# Patient Record
Sex: Female | Born: 1935
Health system: Southern US, Community
[De-identification: ages and names within clinical notes are randomized; demographics above are authoritative.]

## PROBLEM LIST (undated history)

## (undated) DIAGNOSIS — E039 Hypothyroidism, unspecified: Secondary | ICD-10-CM

## (undated) DIAGNOSIS — H3530 Unspecified macular degeneration: Secondary | ICD-10-CM

## (undated) DIAGNOSIS — I10 Essential (primary) hypertension: Secondary | ICD-10-CM

## (undated) DIAGNOSIS — M109 Gout, unspecified: Secondary | ICD-10-CM

## (undated) DIAGNOSIS — I480 Paroxysmal atrial fibrillation: Secondary | ICD-10-CM

## (undated) DIAGNOSIS — I4430 Unspecified atrioventricular block: Secondary | ICD-10-CM

## (undated) DIAGNOSIS — I5023 Acute on chronic systolic (congestive) heart failure: Secondary | ICD-10-CM

## (undated) DIAGNOSIS — R51 Headache: Secondary | ICD-10-CM

## (undated) DIAGNOSIS — R519 Headache, unspecified: Secondary | ICD-10-CM

## (undated) DIAGNOSIS — M1990 Unspecified osteoarthritis, unspecified site: Secondary | ICD-10-CM

## (undated) DIAGNOSIS — I350 Nonrheumatic aortic (valve) stenosis: Secondary | ICD-10-CM

## (undated) HISTORY — PX: DILATATION & CURETTAGE/HYSTEROSCOPY WITH MYOSURE: SHX6511

## (undated) HISTORY — DX: Nonrheumatic aortic (valve) stenosis: I35.0

## (undated) HISTORY — DX: Gout, unspecified: M10.9

## (undated) HISTORY — DX: Paroxysmal atrial fibrillation: I48.0

## (undated) HISTORY — PX: OTHER SURGICAL HISTORY: SHX169

## (undated) HISTORY — DX: Headache, unspecified: R51.9

## (undated) HISTORY — DX: Unspecified osteoarthritis, unspecified site: M19.90

## (undated) HISTORY — DX: Hypothyroidism, unspecified: E03.9

## (undated) HISTORY — DX: Essential (primary) hypertension: I10

## (undated) HISTORY — PX: TONSILLECTOMY: SUR1361

## (undated) HISTORY — DX: Headache: R51

---

## 1998-12-20 ENCOUNTER — Other Ambulatory Visit: Admission: RE | Admit: 1998-12-20 | Discharge: 1998-12-20 | Payer: Self-pay | Admitting: Obstetrics and Gynecology

## 2004-07-04 ENCOUNTER — Ambulatory Visit: Payer: Self-pay | Admitting: Internal Medicine

## 2004-11-07 ENCOUNTER — Ambulatory Visit: Payer: Self-pay | Admitting: Internal Medicine

## 2005-03-01 ENCOUNTER — Ambulatory Visit: Payer: Self-pay | Admitting: Internal Medicine

## 2005-04-12 ENCOUNTER — Ambulatory Visit: Payer: Self-pay | Admitting: Internal Medicine

## 2005-06-05 ENCOUNTER — Ambulatory Visit: Payer: Self-pay | Admitting: Internal Medicine

## 2005-12-04 ENCOUNTER — Ambulatory Visit: Payer: Self-pay | Admitting: Internal Medicine

## 2006-04-16 ENCOUNTER — Ambulatory Visit: Payer: Self-pay | Admitting: Internal Medicine

## 2006-06-02 ENCOUNTER — Ambulatory Visit: Payer: Self-pay | Admitting: Internal Medicine

## 2006-06-02 LAB — CONVERTED CEMR LAB
BUN: 30 mg/dL — ABNORMAL HIGH (ref 6–23)
CO2: 31 meq/L (ref 19–32)
Calcium: 9.6 mg/dL (ref 8.4–10.5)
Chloride: 105 meq/L (ref 96–112)
Creatinine, Ser: 1.2 mg/dL (ref 0.4–1.2)
GFR calc non Af Amer: 45 mL/min
Glomerular Filtration Rate, Af Am: 55 mL/min/{1.73_m2}
Glucose, Bld: 91 mg/dL (ref 70–99)
Potassium: 4.1 meq/L (ref 3.5–5.1)
Sodium: 141 meq/L (ref 135–145)
TSH: 1.57 microintl units/mL (ref 0.35–5.50)

## 2006-09-11 ENCOUNTER — Ambulatory Visit: Payer: Self-pay | Admitting: Family Medicine

## 2006-11-03 ENCOUNTER — Ambulatory Visit: Payer: Self-pay | Admitting: Internal Medicine

## 2006-11-25 ENCOUNTER — Ambulatory Visit: Payer: Self-pay | Admitting: Internal Medicine

## 2006-12-02 ENCOUNTER — Ambulatory Visit: Payer: Self-pay | Admitting: Internal Medicine

## 2006-12-02 LAB — CONVERTED CEMR LAB
ALT: 22 units/L (ref 0–40)
AST: 33 units/L (ref 0–37)
Albumin: 4 g/dL (ref 3.5–5.2)
Alkaline Phosphatase: 70 units/L (ref 39–117)
BUN: 28 mg/dL — ABNORMAL HIGH (ref 6–23)
Basophils Absolute: 0 10*3/uL (ref 0.0–0.1)
Basophils Relative: 0.2 % (ref 0.0–1.0)
Bilirubin, Direct: 0.1 mg/dL (ref 0.0–0.3)
CO2: 30 meq/L (ref 19–32)
Calcium: 9.4 mg/dL (ref 8.4–10.5)
Chloride: 104 meq/L (ref 96–112)
Creatinine, Ser: 1.2 mg/dL (ref 0.4–1.2)
Eosinophils Absolute: 0.2 10*3/uL (ref 0.0–0.6)
Eosinophils Relative: 2.8 % (ref 0.0–5.0)
GFR calc Af Amer: 55 mL/min
GFR calc non Af Amer: 45 mL/min
Glucose, Bld: 96 mg/dL (ref 70–99)
HCT: 39.1 % (ref 36.0–46.0)
Hemoglobin: 13.4 g/dL (ref 12.0–15.0)
Lymphocytes Relative: 28.8 % (ref 12.0–46.0)
MCHC: 34.2 g/dL (ref 30.0–36.0)
MCV: 84.3 fL (ref 78.0–100.0)
Monocytes Absolute: 0.5 10*3/uL (ref 0.2–0.7)
Monocytes Relative: 9.4 % (ref 3.0–11.0)
Neutro Abs: 3.4 10*3/uL (ref 1.4–7.7)
Neutrophils Relative %: 58.8 % (ref 43.0–77.0)
Platelets: 203 10*3/uL (ref 150–400)
Potassium: 4 meq/L (ref 3.5–5.1)
RBC: 4.64 M/uL (ref 3.87–5.11)
RDW: 13.4 % (ref 11.5–14.6)
Sodium: 141 meq/L (ref 135–145)
TSH: 5.17 microintl units/mL (ref 0.35–5.50)
Total Bilirubin: 0.9 mg/dL (ref 0.3–1.2)
Total Protein: 7.8 g/dL (ref 6.0–8.3)
WBC: 5.7 10*3/uL (ref 4.5–10.5)

## 2006-12-04 ENCOUNTER — Ambulatory Visit: Payer: Self-pay | Admitting: Internal Medicine

## 2007-03-19 DIAGNOSIS — I4510 Unspecified right bundle-branch block: Secondary | ICD-10-CM | POA: Insufficient documentation

## 2007-03-19 DIAGNOSIS — E039 Hypothyroidism, unspecified: Secondary | ICD-10-CM | POA: Insufficient documentation

## 2007-03-19 DIAGNOSIS — I10 Essential (primary) hypertension: Secondary | ICD-10-CM

## 2007-03-19 DIAGNOSIS — M810 Age-related osteoporosis without current pathological fracture: Secondary | ICD-10-CM | POA: Insufficient documentation

## 2007-05-25 ENCOUNTER — Ambulatory Visit: Payer: Self-pay | Admitting: Internal Medicine

## 2007-05-26 LAB — CONVERTED CEMR LAB
BUN: 29 mg/dL — ABNORMAL HIGH (ref 6–23)
CO2: 29 meq/L (ref 19–32)
Calcium: 9.5 mg/dL (ref 8.4–10.5)
Chloride: 105 meq/L (ref 96–112)
Creatinine, Ser: 1.3 mg/dL — ABNORMAL HIGH (ref 0.4–1.2)
GFR calc Af Amer: 50 mL/min
GFR calc non Af Amer: 41 mL/min
Glucose, Bld: 79 mg/dL (ref 70–99)
Potassium: 4 meq/L (ref 3.5–5.1)
Sodium: 140 meq/L (ref 135–145)
TSH: 4.88 microintl units/mL (ref 0.35–5.50)

## 2007-06-26 ENCOUNTER — Ambulatory Visit: Payer: Self-pay | Admitting: Internal Medicine

## 2007-06-26 DIAGNOSIS — L570 Actinic keratosis: Secondary | ICD-10-CM

## 2007-07-03 ENCOUNTER — Ambulatory Visit: Payer: Self-pay | Admitting: Internal Medicine

## 2007-07-16 ENCOUNTER — Telehealth: Payer: Self-pay | Admitting: Internal Medicine

## 2007-09-08 ENCOUNTER — Ambulatory Visit: Payer: Self-pay | Admitting: Internal Medicine

## 2007-09-14 ENCOUNTER — Ambulatory Visit: Payer: Self-pay | Admitting: Internal Medicine

## 2007-09-17 ENCOUNTER — Ambulatory Visit: Payer: Self-pay | Admitting: Internal Medicine

## 2007-09-23 ENCOUNTER — Ambulatory Visit: Payer: Self-pay | Admitting: Internal Medicine

## 2007-11-23 ENCOUNTER — Ambulatory Visit: Payer: Self-pay | Admitting: Internal Medicine

## 2007-11-24 LAB — CONVERTED CEMR LAB
ALT: 28 units/L (ref 0–35)
Alkaline Phosphatase: 47 units/L (ref 39–117)
Basophils Absolute: 0 10*3/uL (ref 0.0–0.1)
Bilirubin, Direct: 0.1 mg/dL (ref 0.0–0.3)
CO2: 31 meq/L (ref 19–32)
Calcium: 9.8 mg/dL (ref 8.4–10.5)
GFR calc Af Amer: 50 mL/min
Glucose, Bld: 87 mg/dL (ref 70–99)
Lymphocytes Relative: 39.1 % (ref 12.0–46.0)
MCHC: 33.7 g/dL (ref 30.0–36.0)
Monocytes Relative: 8 % (ref 3.0–12.0)
Neutro Abs: 2.4 10*3/uL (ref 1.4–7.7)
Platelets: 189 10*3/uL (ref 150–400)
Potassium: 3.6 meq/L (ref 3.5–5.1)
RDW: 13.2 % (ref 11.5–14.6)
Sodium: 143 meq/L (ref 135–145)
Total Bilirubin: 0.8 mg/dL (ref 0.3–1.2)
Total Protein: 7.3 g/dL (ref 6.0–8.3)

## 2007-11-26 ENCOUNTER — Emergency Department (HOSPITAL_COMMUNITY): Admission: EM | Admit: 2007-11-26 | Discharge: 2007-11-26 | Payer: Self-pay | Admitting: Emergency Medicine

## 2007-12-07 ENCOUNTER — Ambulatory Visit: Payer: Self-pay | Admitting: Internal Medicine

## 2008-01-08 ENCOUNTER — Ambulatory Visit: Payer: Self-pay | Admitting: Internal Medicine

## 2008-01-14 ENCOUNTER — Ambulatory Visit: Payer: Self-pay | Admitting: Internal Medicine

## 2008-01-15 ENCOUNTER — Ambulatory Visit: Payer: Self-pay | Admitting: Internal Medicine

## 2008-02-19 ENCOUNTER — Encounter: Payer: Self-pay | Admitting: Internal Medicine

## 2008-03-30 ENCOUNTER — Ambulatory Visit: Payer: Self-pay | Admitting: Internal Medicine

## 2008-04-06 ENCOUNTER — Telehealth: Payer: Self-pay | Admitting: Internal Medicine

## 2008-04-13 ENCOUNTER — Ambulatory Visit: Payer: Self-pay | Admitting: Internal Medicine

## 2008-04-27 ENCOUNTER — Ambulatory Visit: Payer: Self-pay | Admitting: Internal Medicine

## 2008-04-27 LAB — CONVERTED CEMR LAB

## 2008-04-29 LAB — CONVERTED CEMR LAB
BUN: 27 mg/dL — ABNORMAL HIGH (ref 6–23)
CO2: 28 meq/L (ref 19–32)
Eosinophils Relative: 2.7 % (ref 0.0–5.0)
Glucose, Bld: 95 mg/dL (ref 70–99)
HCT: 40 % (ref 36.0–46.0)
Hemoglobin: 13.7 g/dL (ref 12.0–15.0)
Lymphocytes Relative: 34.8 % (ref 12.0–46.0)
Monocytes Absolute: 0.5 10*3/uL (ref 0.1–1.0)
Monocytes Relative: 8.9 % (ref 3.0–12.0)
Neutro Abs: 3 10*3/uL (ref 1.4–7.7)
Platelets: 213 10*3/uL (ref 150–400)
Potassium: 4.4 meq/L (ref 3.5–5.1)
RDW: 13.8 % (ref 11.5–14.6)
Sodium: 144 meq/L (ref 135–145)
WBC: 5.5 10*3/uL (ref 4.5–10.5)

## 2008-05-13 ENCOUNTER — Ambulatory Visit: Payer: Self-pay | Admitting: Internal Medicine

## 2008-05-26 ENCOUNTER — Ambulatory Visit: Payer: Self-pay | Admitting: Internal Medicine

## 2008-05-27 ENCOUNTER — Telehealth: Payer: Self-pay | Admitting: Internal Medicine

## 2008-05-28 ENCOUNTER — Telehealth (INDEPENDENT_AMBULATORY_CARE_PROVIDER_SITE_OTHER): Payer: Self-pay | Admitting: *Deleted

## 2008-05-30 ENCOUNTER — Ambulatory Visit: Payer: Self-pay | Admitting: Internal Medicine

## 2008-06-07 ENCOUNTER — Ambulatory Visit: Payer: Self-pay | Admitting: Internal Medicine

## 2008-06-28 ENCOUNTER — Ambulatory Visit: Payer: Self-pay | Admitting: Internal Medicine

## 2008-06-28 DIAGNOSIS — M109 Gout, unspecified: Secondary | ICD-10-CM

## 2008-06-28 LAB — CONVERTED CEMR LAB: Vit D, 1,25-Dihydroxy: 52 (ref 30–89)

## 2008-06-29 LAB — CONVERTED CEMR LAB
CO2: 27 meq/L (ref 19–32)
Chloride: 106 meq/L (ref 96–112)
Glucose, Bld: 102 mg/dL — ABNORMAL HIGH (ref 70–99)
Sodium: 142 meq/L (ref 135–145)
TSH: 0.02 microintl units/mL — ABNORMAL LOW (ref 0.35–5.50)
Uric Acid, Serum: 9.9 mg/dL — ABNORMAL HIGH (ref 2.4–7.0)

## 2008-07-08 ENCOUNTER — Ambulatory Visit: Payer: Self-pay | Admitting: Internal Medicine

## 2008-07-15 ENCOUNTER — Ambulatory Visit: Payer: Self-pay | Admitting: Internal Medicine

## 2008-07-22 ENCOUNTER — Ambulatory Visit: Payer: Self-pay | Admitting: Internal Medicine

## 2008-08-12 ENCOUNTER — Ambulatory Visit: Payer: Self-pay | Admitting: Internal Medicine

## 2008-09-07 ENCOUNTER — Ambulatory Visit: Payer: Self-pay | Admitting: Internal Medicine

## 2008-10-24 ENCOUNTER — Ambulatory Visit: Payer: Self-pay | Admitting: Internal Medicine

## 2008-12-12 ENCOUNTER — Ambulatory Visit: Payer: Self-pay | Admitting: Internal Medicine

## 2008-12-15 LAB — CONVERTED CEMR LAB
Calcium: 9.9 mg/dL (ref 8.4–10.5)
GFR calc non Af Amer: 44.98 mL/min (ref >60)
Potassium: 4.7 meq/L (ref 3.5–5.1)
Sodium: 141 meq/L (ref 135–145)
TSH: 0.02 microintl units/mL — ABNORMAL LOW (ref 0.35–5.50)

## 2009-02-02 ENCOUNTER — Ambulatory Visit: Payer: Self-pay | Admitting: Internal Medicine

## 2011-07-16 DIAGNOSIS — H3530 Unspecified macular degeneration: Secondary | ICD-10-CM | POA: Insufficient documentation

## 2014-09-12 ENCOUNTER — Encounter: Payer: Self-pay | Admitting: Internal Medicine

## 2014-09-12 ENCOUNTER — Encounter: Payer: Self-pay | Admitting: *Deleted

## 2014-09-12 ENCOUNTER — Ambulatory Visit (INDEPENDENT_AMBULATORY_CARE_PROVIDER_SITE_OTHER): Payer: Medicare Other | Admitting: Internal Medicine

## 2014-09-12 VITALS — BP 172/40 | HR 39 | Ht 63.0 in | Wt 158.6 lb

## 2014-09-12 DIAGNOSIS — R011 Cardiac murmur, unspecified: Secondary | ICD-10-CM

## 2014-09-12 DIAGNOSIS — R001 Bradycardia, unspecified: Secondary | ICD-10-CM

## 2014-09-12 NOTE — Progress Notes (Signed)
ELECTROPHYSIOLOGY CONSULT NOTE  Patient ID: Kara Cruz, MRN: [id number], DOB/AGE: 79/04/1918 79 y.o. Admit date: (Not on file) Date of Consult: 09/12/2014  Primary Physician: Garlan FillersPATERSON,DANIEL G, MD Primary Cardiologist: new  Chief Complaint: slow heart rate   HPI Kara Codernne S Brinkman is a 81 y.o. female  Seen as an add-on today at the request of Dr. Eloise HarmanPaterson because she had identified a major change in her heart rate during her daily vital sign assessment.  Her heart rate typically runs in the 70 range. She woke up yesterday morning and it was 35. The remaining 35 today. She went to see her PCP.  She's noticed a modest change in her exercise tolerance. She is however, better today than she was yesterday.  She denies chest pain shortness of breath lightheadedness or peripheral edema.  She has however noted over recent weeks and months increasing problems with exercise tolerance.  She has no history of right bundle branch block according to problem list. She also has a history of long-standing of a heart murmur.     Past Medical History  Diagnosis Date  . HTN (hypertension)   . Hypothyroidism   . Gout   . Dizziness     transient  . Osteoarthritis   . Frequent headaches       Surgical History:  Past Surgical History  Procedure Laterality Date  . Tonsillectomy      1928  . Dilatation & curettage/hysteroscopy with myosure    . Eye injections    . Euflexa into knees       Home Meds: Prior to Admission medications   Medication Sig Start Date End Date Taking? Authorizing Provider  allopurinol (ZYLOPRIM) 100 MG tablet Take 100 mg by mouth daily.   Yes Historical Provider, MD  Calcium Carbonate-Vitamin D (CALTRATE 600+D PO) Take 1 tablet by mouth 2 (two) times daily.   Yes Historical Provider, MD  cholecalciferol (VITAMIN D) 1000 UNITS tablet Take 1,000 Units by mouth daily.   Yes Historical Provider, MD  levothyroxine (SYNTHROID, LEVOTHROID) 100 MCG tablet Take 100 mcg by  mouth daily before breakfast.   Yes Historical Provider, MD  losartan-hydrochlorothiazide (HYZAAR) 100-12.5 MG per tablet Take 1 tablet by mouth daily.   Yes Historical Provider, MD  Lutein 20 MG TABS Take 1 tablet by mouth daily.   Yes Historical Provider, MD  meclizine (ANTIVERT) 25 MG tablet Take 25 mg by mouth 3 (three) times daily as needed for dizziness.   Yes Historical Provider, MD  Multiple Vitamin (MULTIVITAMIN) capsule Take 1 capsule by mouth daily.   Yes Historical Provider, MD  Multiple Vitamins-Minerals (ICAPS) CAPS Take 2 capsules by mouth daily.   Yes Historical Provider, MD  Potassium Bicarbonate 99 MG CAPS Take 1 capsule by mouth daily.   Yes Historical Provider, MD  VITAMIN E PO Take 1 capsule by mouth daily.   Yes Historical Provider, MD      Allergies:  Allergies  Allergen Reactions  . Codeine Sulfate     REACTION: "out of body experience"--"floats"  . Diphenhydramine Hcl     REACTION: headache, vertigo  . Doxycycline Hyclate     REACTION: nausea, vomiting  . Prednisone     REACTION: SOB, difficulty swallowing    History   Social History  . Marital Status: Widowed    Spouse Name: N/A    Number of Children: N/A  . Years of Education: N/A   Occupational History  . Not on file.   Social History  Main Topics  . Smoking status: Former Games developer  . Smokeless tobacco: Not on file  . Alcohol Use: No  . Drug Use: No  . Sexual Activity: Not on file   Other Topics Concern  . Not on file   Social History Narrative     Family History  Problem Relation Age of Onset  . Hypertension Mother   . Arthritis Mother   . Stroke Mother   . Heart attack Father   . Stroke Brother   . Prostate cancer Brother   . Heart disease Brother   . Kidney failure Brother   . Heart disease Brother   . Arthritis Brother   . Edema Brother      ROS:  Please see the history of present illness.     All other systems reviewed and negative.    Physical Exam: She had a positive  preprocedure lab Blood pressure 172/40, pulse 39, height  (1.6 m), weight 158 lb 9.6 oz (71.94 kg). General: Well developed, well nourished female in no acute distress. Head: Normocephalic, atraumatic, sclera non-icteric, no xanthomas, nares are without discharge. EENT: normal Lymph Nodes:  none Back: without scoliosis/kyphosis  no CVA tendersness Neck: Negative for carotid bruits. JVD not elevated  Carotids delayed Lungs: Clear bilaterally to auscultation without wheezes, rales, or rhonchi. Breathing is unlabored. Heart: slow wRRR with S1 S2. 3/6 systolic murmur , rubs, or gallops appreciated. Abdomen: Soft, non-tender, non-distended with normoactive bowel sounds. No hepatomegaly. No rebound/guarding. No obvious abdominal masses. Msk:  Strength and tone appear normal for age. Extremities: No clubbing or cyanosis. No edema.  Distal pedal pulses are 2+ and equal bilaterally. Skin: Warm and Dry Neuro: Alert and oriented X 3. CN III-XII intact Grossly normal sensory and motor function . Psych:  Responds to questions appropriately with a normal affect.      Labs: Cardiac Enzymes No results for input(s): CKTOTAL, CKMB, TROPONINI in the last 72 hours. CBC Lab Results  Component Value Date   WBC 5.5 04/27/2008   HGB 13.7 04/27/2008   HCT 40.0 04/27/2008   MCV 84.6 04/27/2008   PLT 213 04/27/2008   PROTIME: No results for input(s): LABPROT, INR in the last 72 hours. Chemistry No results for input(s): NA, K, CL, CO2, BUN, CREATININE, CALCIUM, PROT, BILITOT, ALKPHOS, ALT, AST, GLUCOSE in the last 168 hours.  Invalid input(s): LABALBU Lipids No results found for: CHOL, HDL, LDLCALC, TRIG BNP No results found for: PROBNP Miscellaneous No results found for: DDIMER  Radiology/Studies:  No results found.  EKG: NSR @ 78 2:1 AVblock  RBBB  LAD     Assessment and Plan:   Heart Block  2:1   RBBB LAD  Hypertension   Outflow murmur consistent with aortic stenosis  She  has new onset high-grade heart block in the context of bifascicular block. There is no reversible cause. We will check her thyroid blood (this is going to be an issue. We have discussed the potential role of pacing including the potential risks of the procedure. She says that "I am 89". She is not sure that she would like to proceed with pacing.  We will obtain an echo to look to see if she has aortic stenosis   I have told her that there is no expected benefit for functional status with pacing as she was presumably in sinus rhythm 48 hours ago based on her heart rate numbers.\ If echo confirmed significant aortic stenosis, she may be a candidate for TAVI  Her blood pressure treatment options will change following pacing.   Sherryl Manges

## 2014-09-12 NOTE — Patient Instructions (Signed)
Your physician recommends that you continue on your current medications as directed. Please refer to the Current Medication list given to you today.  Your physician has requested that you have an echocardiogram. Echocardiography is a painless test that uses sound waves to create images of your heart. It provides your doctor with information about the size and shape of your heart and how well your heart's chambers and valves are working. This procedure takes approximately one hour. There are no restrictions for this procedure.  We will call you tomorrow to discuss next step.

## 2014-09-13 ENCOUNTER — Encounter (HOSPITAL_COMMUNITY): Payer: Self-pay | Admitting: Pharmacy Technician

## 2014-09-13 ENCOUNTER — Telehealth: Payer: Self-pay | Admitting: *Deleted

## 2014-09-13 ENCOUNTER — Encounter: Payer: Self-pay | Admitting: *Deleted

## 2014-09-13 ENCOUNTER — Other Ambulatory Visit: Payer: Self-pay | Admitting: Internal Medicine

## 2014-09-13 ENCOUNTER — Ambulatory Visit (HOSPITAL_BASED_OUTPATIENT_CLINIC_OR_DEPARTMENT_OTHER): Payer: Medicare Other | Admitting: Radiology

## 2014-09-13 DIAGNOSIS — I442 Atrioventricular block, complete: Secondary | ICD-10-CM

## 2014-09-13 DIAGNOSIS — R011 Cardiac murmur, unspecified: Secondary | ICD-10-CM | POA: Insufficient documentation

## 2014-09-13 DIAGNOSIS — R001 Bradycardia, unspecified: Secondary | ICD-10-CM

## 2014-09-13 DIAGNOSIS — Z87891 Personal history of nicotine dependence: Secondary | ICD-10-CM | POA: Insufficient documentation

## 2014-09-13 DIAGNOSIS — I10 Essential (primary) hypertension: Secondary | ICD-10-CM

## 2014-09-13 NOTE — Telephone Encounter (Signed)
Spoke with patient's dtr, per her ok.   Patient would like to proceed with PPM implant. Reviewed PPM instructions. NPO after MN, may take morning medications with small amount of water, wash chest/neck tonight and in morning, overnight stay. Wound check arranged for 2/22. Patient and her dtr verbalized understanding and agreeable to plan.

## 2014-09-13 NOTE — Progress Notes (Signed)
Echocardiogram performed.  

## 2014-09-14 ENCOUNTER — Inpatient Hospital Stay (HOSPITAL_COMMUNITY)
Admission: RE | Admit: 2014-09-14 | Discharge: 2014-09-16 | DRG: 310 | Disposition: A | Discharge status: Home / Self Care | Payer: Medicare Other | Source: Ambulatory Visit | Attending: Internal Medicine | Admitting: Internal Medicine

## 2014-09-14 ENCOUNTER — Encounter (HOSPITAL_COMMUNITY): Payer: Self-pay | Admitting: *Deleted

## 2014-09-14 ENCOUNTER — Encounter (HOSPITAL_COMMUNITY)
Admission: RE | Disposition: A | Discharge status: Home / Self Care | Payer: Self-pay | Source: Ambulatory Visit | Attending: Internal Medicine

## 2014-09-14 DIAGNOSIS — I442 Atrioventricular block, complete: Secondary | ICD-10-CM

## 2014-09-14 DIAGNOSIS — E039 Hypothyroidism, unspecified: Secondary | ICD-10-CM | POA: Diagnosis present

## 2014-09-14 DIAGNOSIS — M109 Gout, unspecified: Secondary | ICD-10-CM | POA: Diagnosis present

## 2014-09-14 DIAGNOSIS — I350 Nonrheumatic aortic (valve) stenosis: Secondary | ICD-10-CM

## 2014-09-14 DIAGNOSIS — Z950 Presence of cardiac pacemaker: Secondary | ICD-10-CM

## 2014-09-14 DIAGNOSIS — Z8249 Family history of ischemic heart disease and other diseases of the circulatory system: Secondary | ICD-10-CM

## 2014-09-14 DIAGNOSIS — I272 Other secondary pulmonary hypertension: Secondary | ICD-10-CM | POA: Diagnosis present

## 2014-09-14 DIAGNOSIS — I10 Essential (primary) hypertension: Secondary | ICD-10-CM | POA: Diagnosis present

## 2014-09-14 DIAGNOSIS — Z87891 Personal history of nicotine dependence: Secondary | ICD-10-CM

## 2014-09-14 DIAGNOSIS — R011 Cardiac murmur, unspecified: Secondary | ICD-10-CM | POA: Diagnosis present

## 2014-09-14 DIAGNOSIS — R001 Bradycardia, unspecified: Secondary | ICD-10-CM | POA: Diagnosis present

## 2014-09-14 DIAGNOSIS — I4510 Unspecified right bundle-branch block: Secondary | ICD-10-CM | POA: Diagnosis present

## 2014-09-14 HISTORY — PX: PERMANENT PACEMAKER INSERTION: SHX5480

## 2014-09-14 HISTORY — DX: Unspecified atrioventricular block: I44.30

## 2014-09-14 LAB — SURGICAL PCR SCREEN
MRSA, PCR: NEGATIVE
STAPHYLOCOCCUS AUREUS: NEGATIVE

## 2014-09-14 SURGERY — PERMANENT PACEMAKER INSERTION
Anesthesia: LOCAL

## 2014-09-14 MED ORDER — CEFAZOLIN SODIUM 1-5 GM-% IV SOLN
1.0000 g | Freq: Four times a day (QID) | INTRAVENOUS | Status: AC
  Administered 2014-09-14 – 2014-09-15 (×3): 1 g via INTRAVENOUS
  Filled 2014-09-14 (×4): qty 50

## 2014-09-14 MED ORDER — VITAMIN D3 25 MCG (1000 UNIT) PO TABS
1000.0000 [IU] | ORAL_TABLET | Freq: Every day | ORAL | Status: DC
  Administered 2014-09-15 – 2014-09-16 (×2): 1000 [IU] via ORAL
  Filled 2014-09-14 (×2): qty 1

## 2014-09-14 MED ORDER — ONDANSETRON HCL 4 MG/2ML IJ SOLN: 4.0000 mg | Freq: Four times a day (QID) | INTRAMUSCULAR | Status: DC | PRN

## 2014-09-14 MED ORDER — HEPARIN (PORCINE) IN NACL 2-0.9 UNIT/ML-% IJ SOLN
INTRAMUSCULAR | Status: AC
  Filled 2014-09-14: qty 500

## 2014-09-14 MED ORDER — LABETALOL HCL 100 MG PO TABS
100.0000 mg | ORAL_TABLET | Freq: Two times a day (BID) | ORAL | Status: DC
  Administered 2014-09-14 – 2014-09-16 (×4): 100 mg via ORAL
  Filled 2014-09-14 (×6): qty 1

## 2014-09-14 MED ORDER — FENTANYL CITRATE 0.05 MG/ML IJ SOLN
INTRAMUSCULAR | Status: AC
  Filled 2014-09-14: qty 2

## 2014-09-14 MED ORDER — SODIUM CHLORIDE 0.9 % IV SOLN
INTRAVENOUS | Status: AC
  Administered 2014-09-14: 18:00:00 via INTRAVENOUS

## 2014-09-14 MED ORDER — LOSARTAN POTASSIUM 50 MG PO TABS
100.0000 mg | ORAL_TABLET | Freq: Every day | ORAL | Status: DC
  Administered 2014-09-15 – 2014-09-16 (×2): 100 mg via ORAL
  Filled 2014-09-14 (×2): qty 2

## 2014-09-14 MED ORDER — SODIUM CHLORIDE 0.9 % IV SOLN
INTRAVENOUS | Status: DC
  Administered 2014-09-14: 11:00:00 via INTRAVENOUS

## 2014-09-14 MED ORDER — CEFAZOLIN SODIUM-DEXTROSE 2-3 GM-% IV SOLR
INTRAVENOUS | Status: AC
  Filled 2014-09-14: qty 50

## 2014-09-14 MED ORDER — ACETAMINOPHEN 325 MG PO TABS
325.0000 mg | ORAL_TABLET | ORAL | Status: DC | PRN
  Administered 2014-09-15 – 2014-09-16 (×2): 650 mg via ORAL
  Filled 2014-09-14 (×2): qty 2

## 2014-09-14 MED ORDER — MECLIZINE HCL 25 MG PO TABS
25.0000 mg | ORAL_TABLET | Freq: Three times a day (TID) | ORAL | Status: DC | PRN
  Filled 2014-09-14: qty 1

## 2014-09-14 MED ORDER — LOSARTAN POTASSIUM-HCTZ 100-12.5 MG PO TABS: 1.0000 | ORAL_TABLET | Freq: Every day | ORAL | Status: DC

## 2014-09-14 MED ORDER — METOPROLOL TARTRATE 50 MG PO TABS
50.0000 mg | ORAL_TABLET | ORAL | Status: AC
  Administered 2014-09-14: 50 mg via ORAL
  Filled 2014-09-14: qty 1

## 2014-09-14 MED ORDER — LIDOCAINE HCL (PF) 1 % IJ SOLN
INTRAMUSCULAR | Status: AC
  Filled 2014-09-14: qty 60

## 2014-09-14 MED ORDER — MUPIROCIN 2 % EX OINT
TOPICAL_OINTMENT | Freq: Two times a day (BID) | CUTANEOUS | Status: DC
  Administered 2014-09-15: 10:00:00 via NASAL

## 2014-09-14 MED ORDER — SODIUM CHLORIDE 0.9 % IR SOLN
80.0000 mg | Status: DC
  Filled 2014-09-14 (×2): qty 2

## 2014-09-14 MED ORDER — HYDROCHLOROTHIAZIDE 12.5 MG PO CAPS
12.5000 mg | ORAL_CAPSULE | Freq: Every day | ORAL | Status: DC
  Administered 2014-09-15 – 2014-09-16 (×2): 12.5 mg via ORAL
  Filled 2014-09-14 (×2): qty 1

## 2014-09-14 MED ORDER — CHLORHEXIDINE GLUCONATE 4 % EX LIQD: 60.0000 mL | Freq: Once | CUTANEOUS | Status: DC

## 2014-09-14 MED ORDER — LEVOTHYROXINE SODIUM 100 MCG PO TABS
100.0000 ug | ORAL_TABLET | Freq: Every day | ORAL | Status: DC
  Administered 2014-09-15 – 2014-09-16 (×2): 100 ug via ORAL
  Filled 2014-09-14 (×4): qty 1

## 2014-09-14 MED ORDER — MIDAZOLAM HCL 5 MG/5ML IJ SOLN
INTRAMUSCULAR | Status: AC
  Filled 2014-09-14: qty 5

## 2014-09-14 MED ORDER — CEFAZOLIN SODIUM-DEXTROSE 2-3 GM-% IV SOLR: 2.0000 g | INTRAVENOUS | Status: DC

## 2014-09-14 MED ORDER — ALLOPURINOL 100 MG PO TABS
100.0000 mg | ORAL_TABLET | Freq: Every day | ORAL | Status: DC
  Administered 2014-09-15 – 2014-09-16 (×2): 100 mg via ORAL
  Filled 2014-09-14 (×2): qty 1

## 2014-09-14 MED ORDER — MUPIROCIN 2 % EX OINT
TOPICAL_OINTMENT | CUTANEOUS | Status: AC
  Administered 2014-09-14: 11:00:00
  Filled 2014-09-14: qty 22

## 2014-09-14 NOTE — Progress Notes (Signed)
Pacemaker booklet and temporary ID given to daughter.

## 2014-09-14 NOTE — H&P (View-Only) (Signed)
  ELECTROPHYSIOLOGY CONSULT NOTE  Patient ID: Kara Cruz, MRN: [id number], DOB/AGE: 02/12/1918 81 y.o. Admit date: (Not on file) Date of Consult: 09/12/2014  Primary Physician: PATERSON,DANIEL G, MD Primary Cardiologist: new  Chief Complaint: slow heart rate   HPI Kara Cruz is a 81 y.o. female  Seen as an add-on today at the request of Dr. Paterson because she had identified a major change in her heart rate during her daily vital sign assessment.  Her heart rate typically runs in the 70 range. She woke up yesterday morning and it was 35. The remaining 35 today. She went to see her PCP.  She's noticed a modest change in her exercise tolerance. She is however, better today than she was yesterday.  She denies chest pain shortness of breath lightheadedness or peripheral edema.  She has however noted over recent weeks and months increasing problems with exercise tolerance.  She has no history of right bundle branch block according to problem list. She also has a history of long-standing of a heart murmur.     Past Medical History  Diagnosis Date  . HTN (hypertension)   . Hypothyroidism   . Gout   . Dizziness     transient  . Osteoarthritis   . Frequent headaches       Surgical History:  Past Surgical History  Procedure Laterality Date  . Tonsillectomy      1928  . Dilatation & curettage/hysteroscopy with myosure    . Eye injections    . Euflexa into knees       Home Meds: Prior to Admission medications   Medication Sig Start Date End Date Taking? Authorizing Provider  allopurinol (ZYLOPRIM) 100 MG tablet Take 100 mg by mouth daily.   Yes Historical Provider, MD  Calcium Carbonate-Vitamin D (CALTRATE 600+D PO) Take 1 tablet by mouth 2 (two) times daily.   Yes Historical Provider, MD  cholecalciferol (VITAMIN D) 1000 UNITS tablet Take 1,000 Units by mouth daily.   Yes Historical Provider, MD  levothyroxine (SYNTHROID, LEVOTHROID) 100 MCG tablet Take 100 mcg by  mouth daily before breakfast.   Yes Historical Provider, MD  losartan-hydrochlorothiazide (HYZAAR) 100-12.5 MG per tablet Take 1 tablet by mouth daily.   Yes Historical Provider, MD  Lutein 20 MG TABS Take 1 tablet by mouth daily.   Yes Historical Provider, MD  meclizine (ANTIVERT) 25 MG tablet Take 25 mg by mouth 3 (three) times daily as needed for dizziness.   Yes Historical Provider, MD  Multiple Vitamin (MULTIVITAMIN) capsule Take 1 capsule by mouth daily.   Yes Historical Provider, MD  Multiple Vitamins-Minerals (ICAPS) CAPS Take 2 capsules by mouth daily.   Yes Historical Provider, MD  Potassium Bicarbonate 99 MG CAPS Take 1 capsule by mouth daily.   Yes Historical Provider, MD  VITAMIN E PO Take 1 capsule by mouth daily.   Yes Historical Provider, MD      Allergies:  Allergies  Allergen Reactions  . Codeine Sulfate     REACTION: "out of body experience"--"floats"  . Diphenhydramine Hcl     REACTION: headache, vertigo  . Doxycycline Hyclate     REACTION: nausea, vomiting  . Prednisone     REACTION: SOB, difficulty swallowing    History   Social History  . Marital Status: Widowed    Spouse Name: N/A    Number of Children: N/A  . Years of Education: N/A   Occupational History  . Not on file.   Social History   Main Topics  . Smoking status: Former Smoker  . Smokeless tobacco: Not on file  . Alcohol Use: No  . Drug Use: No  . Sexual Activity: Not on file   Other Topics Concern  . Not on file   Social History Narrative     Family History  Problem Relation Age of Onset  . Hypertension Mother   . Arthritis Mother   . Stroke Mother   . Heart attack Father   . Stroke Brother   . Prostate cancer Brother   . Heart disease Brother   . Kidney failure Brother   . Heart disease Brother   . Arthritis Brother   . Edema Brother      ROS:  Please see the history of present illness.     All other systems reviewed and negative.    Physical Exam: She had a positive  preprocedure lab Blood pressure 172/40, pulse 39, height 5' 3" (1.6 m), weight 158 lb 9.6 oz (71.94 kg). General: Well developed, well nourished female in no acute distress. Head: Normocephalic, atraumatic, sclera non-icteric, no xanthomas, nares are without discharge. EENT: normal Lymph Nodes:  none Back: without scoliosis/kyphosis  no CVA tendersness Neck: Negative for carotid bruits. JVD not elevated  Carotids delayed Lungs: Clear bilaterally to auscultation without wheezes, rales, or rhonchi. Breathing is unlabored. Heart: slow wRRR with S1 S2. 3/6 systolic murmur , rubs, or gallops appreciated. Abdomen: Soft, non-tender, non-distended with normoactive bowel sounds. No hepatomegaly. No rebound/guarding. No obvious abdominal masses. Msk:  Strength and tone appear normal for age. Extremities: No clubbing or cyanosis. No edema.  Distal pedal pulses are 2+ and equal bilaterally. Skin: Warm and Dry Neuro: Alert and oriented X 3. CN III-XII intact Grossly normal sensory and motor function . Psych:  Responds to questions appropriately with a normal affect.      Labs: Cardiac Enzymes No results for input(s): CKTOTAL, CKMB, TROPONINI in the last 72 hours. CBC Lab Results  Component Value Date   WBC 5.5 04/27/2008   HGB 13.7 04/27/2008   HCT 40.0 04/27/2008   MCV 84.6 04/27/2008   PLT 213 04/27/2008   PROTIME: No results for input(s): LABPROT, INR in the last 72 hours. Chemistry No results for input(s): NA, K, CL, CO2, BUN, CREATININE, CALCIUM, PROT, BILITOT, ALKPHOS, ALT, AST, GLUCOSE in the last 168 hours.  Invalid input(s): LABALBU Lipids No results found for: CHOL, HDL, LDLCALC, TRIG BNP No results found for: PROBNP Miscellaneous No results found for: DDIMER  Radiology/Studies:  No results found.  EKG: NSR @ 78 2:1 AVblock  RBBB  LAD     Assessment and Plan:   Heart Block  2:1   RBBB LAD  Hypertension   Outflow murmur consistent with aortic stenosis  She  has new onset high-grade heart block in the context of bifascicular block. There is no reversible cause. We will check her thyroid blood (this is going to be an issue. We have discussed the potential role of pacing including the potential risks of the procedure. She says that "I am 89". She is not sure that she would like to proceed with pacing.  We will obtain an echo to look to see if she has aortic stenosis   I have told her that there is no expected benefit for functional status with pacing as she was presumably in sinus rhythm 48 hours ago based on her heart rate numbers.\ If echo confirmed significant aortic stenosis, she may be a candidate for TAVI    Her blood pressure treatment options will change following pacing.   Tahlia Deamer   

## 2014-09-14 NOTE — Consult Note (Signed)
INTERVENTIONAL CARDIOLOGY CONSULT NOTE  Patient ID: Kara Cruz, MRN: [id number], DOB/AGE: 1918/05/01 81 y.o. Admit date: 09/14/2014 Date of Consult: 09/14/2014  Primary Physician: Garlan Fillers, MD Primary Cardiologist: Dr Graciela Husbands Referring Physician: Dr Graciela Husbands  Chief Complaint: Shortness of breath Reason for Consultation: Aortic Stenosis  HPI: 81 year old woman who presents today for permanent pacemaker placement. She was seen by Dr. Graciela Husbands on February 8 because of complete heart block. She was seen by her primary physician and was noted to have a heart rate of 35 bpm. She was also noted to have a loud systolic heart murmur and an echocardiogram was done. This demonstrated severe calcific aortic stenosis. I was asked to see her for consideration of treatment options.  The patient has been remarkably healthy over the years. She remains functionally independent. Her 41 year old daughter is at the bedside today. Over the past 2 months the patient has noted increasing exertional dyspnea with low-level activity such as walking short distances and getting dressed. She also describes progressive fatigue. She's had no chest pain or pressure. She denies orthopnea or PND. She denies leg swelling, lightheadedness, or syncope.  The patient's other medical problems include neuropathy, back pain, and "bone-on-bone" arthritis of her knees. Her walking has been somewhat limited because of knee pain. She's had a good response to injections of her knees.  Medical History:  Past Medical History  Diagnosis Date  . HTN (hypertension)   . Hypothyroidism   . Gout   . Dizziness     transient  . Osteoarthritis   . Frequent headaches       Surgical History:  Past Surgical History  Procedure Laterality Date  . Tonsillectomy      1928  . Dilatation & curettage/hysteroscopy with myosure    . Eye injections    . Euflexa into knees       Home Meds: Prior to Admission medications   Medication Sig  Start Date End Date Taking? Authorizing Provider  allopurinol (ZYLOPRIM) 100 MG tablet Take 100 mg by mouth daily.   Yes Historical Provider, MD  Calcium Carbonate-Vitamin D (CALTRATE 600+D PO) Take 1 tablet by mouth 2 (two) times daily.   Yes Historical Provider, MD  cholecalciferol (VITAMIN D) 1000 UNITS tablet Take 1,000 Units by mouth daily.   Yes Historical Provider, MD  levothyroxine (SYNTHROID, LEVOTHROID) 100 MCG tablet Take 100 mcg by mouth daily before breakfast.   Yes Historical Provider, MD  losartan-hydrochlorothiazide (HYZAAR) 100-12.5 MG per tablet Take 1 tablet by mouth daily.   Yes Historical Provider, MD  Lutein 20 MG TABS Take 20 mg by mouth daily.    Yes Historical Provider, MD  Multiple Vitamin (MULTIVITAMIN) capsule Take 1 capsule by mouth daily.   Yes Historical Provider, MD  Multiple Vitamins-Minerals (ICAPS) CAPS Take 1 capsule by mouth 2 (two) times daily.    Yes Historical Provider, MD  Polyvinyl Alcohol-Povidone (REFRESH OP) Place 1 drop into both eyes at bedtime.   Yes Historical Provider, MD  Potassium Bicarbonate 99 MG CAPS Take 99 mg by mouth daily.    Yes Historical Provider, MD  VITAMIN E PO Take 1 capsule by mouth daily.   Yes Historical Provider, MD  meclizine (ANTIVERT) 25 MG tablet Take 25 mg by mouth 3 (three) times daily as needed for dizziness (or vertigo).     Historical Provider, MD    Inpatient Medications:  . ceFAZolin      .  ceFAZolin (ANCEF) IV  2 g Intravenous On Call  .  chlorhexidine  60 mL Topical Once  . gentamicin irrigation  80 mg Irrigation On Call  . mupirocin ointment   Nasal BID   . sodium chloride 50 mL/hr at 09/14/14 1115  . sodium chloride 50 mL/hr at 09/14/14 1115    Allergies:  Allergies  Allergen Reactions  . Prednisone Other (See Comments)    REACTION: SOB, difficulty swallowing  . Codeine Sulfate Other (See Comments)    REACTION: "out of body experience"--"floats"  . Diphenhydramine Hcl Other (See Comments)     REACTION: headache, vertigo  . Doxycycline Hyclate Other (See Comments)    REACTION: nausea, vomiting  . Hibiclens [Chlorhexidine] Rash    wipes    History   Social History  . Marital Status: Widowed    Spouse Name: N/A  . Number of Children: N/A  . Years of Education: N/A   Occupational History  . Not on file.   Social History Main Topics  . Smoking status: Former Games developermoker  . Smokeless tobacco: Not on file  . Alcohol Use: No  . Drug Use: No  . Sexual Activity: Not on file   Other Topics Concern  . Not on file   Social History Narrative     Family History  Problem Relation Age of Onset  . Hypertension Mother   . Arthritis Mother   . Stroke Mother   . Heart attack Father   . Stroke Brother   . Prostate cancer Brother   . Heart disease Brother   . Kidney failure Brother   . Heart disease Brother   . Arthritis Brother   . Edema Brother      Review of Systems: General: negative for chills, fever, night sweats or weight changes.  ENT: negative for rhinorrhea or epistaxis Cardiovascular: See history of present illness Dermatological: negative for rash Respiratory: negative for cough or wheezing GI: negative for nausea, vomiting, diarrhea, bright red blood per rectum, melena, or hematemesis GU: no hematuria, urgency, or frequency Neurologic: negative for visual changes, syncope, headache, or dizziness Heme: no easy bruising or bleeding Endo: negative for excessive thirst, thyroid disorder, or flushing Musculoskeletal: Positive for back pain and knee pain  All other systems reviewed and are otherwise negative except as noted above.  Physical Exam: Blood pressure 180/90, pulse 75. Pt is alert and oriented, WD, WN, pleasant elderly woman in no distress. HEENT: normal Neck: JVP normal. Carotid upstrokes normal without bruits. No thyromegaly. Lungs: equal expansion, clear bilaterally CV: Apex is discrete and nondisplaced, RRR a grade 2/6 harsh mid-peaking systolic  murmur with preserved A2 component. Left sided pacemaker site has a dressing in place Abd: soft, NT, +BS, no bruit, no hepatosplenomegaly Back: no CVA tenderness Ext: no C/C/E        DP/PT pulses intact and = Skin: warm and dry without rash Neuro: CNII-XII intact             Strength intact = bilaterally    Labs: No results for input(s): CKTOTAL, CKMB, TROPONINI in the last 72 hours. Lab Results  Component Value Date   WBC 5.5 04/27/2008   HGB 13.7 04/27/2008   HCT 40.0 04/27/2008   MCV 84.6 04/27/2008   PLT 213 04/27/2008   No results for input(s): NA, K, CL, CO2, BUN, CREATININE, CALCIUM, PROT, BILITOT, ALKPHOS, ALT, AST, GLUCOSE in the last 168 hours.  Invalid input(s): LABALBU No results found for: CHOL, HDL, LDLCALC, TRIG No results found for: DDIMER  Radiology/Studies:  No results found.  Cardiac Studies: 2D  Echo: Study Conclusions  - Left ventricle: E/e&'>19 suggestive of elevated LV filling pressures. The cavity size was normal. Systolic function was normal. The estimated ejection fraction was in the range of 60% to 65%. Wall motion was normal; there were no regional wall motion abnormalities. - Aortic valve: Severely calcified annulus. Trileaflet. Severe diffuse thickening and calcification. Valve mobility was restricted. There was severe stenosis. There was mild regurgitation. - Mitral valve: Mildly calcified annulus. Mild diffuse calcification. There was mild to moderate regurgitation. - Left atrium: The atrium was mildly dilated. - Atrial septum: There was increased thickness of the septum, consistent with lipomatous hypertrophy. - Tricuspid valve: There was moderate-severe regurgitation. - Pulmonic valve: There was mild regurgitation. - Pulmonary arteries: PA peak pressure: 73 mm Hg (S).  Impressions:  - The right ventricular systolic pressure was increased consistent with severe pulmonary hypertension.  RISK SCORES About the  STS Risk Calculator Procedure: AV Replacement  Risk of Mortality: 8.081%  Morbidity or Mortality: 28.416%  Long Length of Stay: 15.486%  Short Length of Stay: 11.355%  Permanent Stroke: 3.593%  Prolonged Ventilation: 19.42%  DSW Infection: 0.174%  Renal Failure: 9.819%  Reoperation: 9.509%  ASSESSMENT AND PLAN:  81 year-old woman with possible severe symptomatic aortic stenosis. I have personally reviewed her echo images. She does meet echo criteria for severe aortic stenosis with a mean transaortic valve gradient of 40 mmHg. However, her exam today after permanent pacemaker placement is more consistent with moderate aortic stenosis. Her murmur is mid peaking and closure of the the aortic valve is easily audible. The aortic velocity envelope has an early peak on her echo.  I wonder if complete heart block and a heart rate of 30 at the time of her recent echocardiogram significantly affected her hemodynamic data. I have requested a limited repeat echocardiogram tomorrow to reevaluate her transaortic valve gradients.  I reviewed the natural history of severe aortic stenosis with the patient and her daughter today. Certainly, she does have symptoms of progressive fatigue and shortness of breath. However, at age 34 these could certainly be multifactorial. Will make further plans based on results of her echocardiogram tomorrow. If she does require valve replacement, TAVR certainly seems like a reasonable treatment alternative at this patient's advanced age. Comorbid medical conditions include CKD Stage III, limited functional capacity, and pulmonary hypertension (based on echo data). We reviewed the pros and cons of various treatment options for severe aortic stenosis, including palliative medical therapy, conventional surgical AVR, and TAVR. She has been remarkably healthy over the years and enjoys a good quality of life. If she does in fact have severe aortic stenosis, it would be reasonable to pursue  treatment in my opinion. Will follow up with her after her echocardiogram to determine further evaluation.  Signed, [name] MD, [facility] 09/14/2014, 2:57 PM

## 2014-09-14 NOTE — Progress Notes (Signed)
Ppm rate 75

## 2014-09-14 NOTE — Interval H&P Note (Signed)
History and Physical Interval Note:  09/14/2014 12:10 PM  Kara Cruz  has presented today for surgery, with the diagnosis of bradycardia  The various methods of treatment have been discussed with the patient and family. After consideration of risks, benefits and other options for treatment, the patient has consented to  Procedure(s): PERMANENT PACEMAKER INSERTION (N/A) as a surgical intervention .  The patient's history has been reviewed, patient examined, no change in status, stable for surgery.  I have reviewed the patient's chart and labs.  Questions were answered to the patient's satisfaction.     Sherryl MangesSteven Naika Noto

## 2014-09-14 NOTE — Progress Notes (Signed)
Arm sling to left arm in used., instructed to avoid moving left, zoll at bedside

## 2014-09-14 NOTE — Progress Notes (Signed)
Dr. Excell Seltzerooper talking with patient and daughter re TAVR

## 2014-09-14 NOTE — Progress Notes (Signed)
PATIENT DEVICE DEPENDENT  WILL ANTICIPATE 48 HRS IN HOSPITAL  dR COOPER TO SEE re Aortic stenosis

## 2014-09-14 NOTE — Progress Notes (Signed)
IV saline locked. 

## 2014-09-14 NOTE — Progress Notes (Signed)
Family in room. Waiting on TCU bed assignment. Instructions given to patient. Denies discomfort.

## 2014-09-14 NOTE — CV Procedure (Signed)
Preop DX::complete heart block aortic stenosis  Post op DX:: same  Documented non-reversible symptomatic bradycardia due to second degree and/or third degree atrioventricular block.  Procedure  dual pacemaker implantation  After routine prep and drape, lidocaine was infiltrated in the prepectoral subclavicular region on the left side an incision was made and carried down to later the prepectoral fascia using electrocautery and sharp dissection a pocket was formed similarly. Hemostasis was obtained.  After this, we turned our attention to gaining accessm to the extrathoracic,left subclavian vein. This was accomplished without difficulty and without the aspiration of air or puncture of the artery. 2 separate venipunctures were accomplished; guidewires were placed and retained and sequentially 7 French sheath through which were  passed an Medtronic MRI compatible 5076 ventricular lead serial numberPJN [id number] and an Medtronic MRI compatible 5076 atrial lead serial number [id number] .  The ventricular lead was manipulated to the right ventricular apex with a bipolar R wave was 5.1, the pacing impedance was 1100, the threshold was <1.0 @ 0.5 msec  Current at threshold was   1  Ma and the current of injury was  BRISK.  The right atrial lead was manipulated to the right atrial appendage with a bipolar P-wave  2.5, the pacing impedance was 700, the threshold 2.1@ 0.5 msec   Current at threshold was 3.0  Ma and the current of injury was BRISKSK.  The leads were affixed to the prepectoral fascia and attached to a Medtronic MRI compatible  pulse generator serial number [id number].  Hemostasis was obtained. The pocket was copiously irrigated with antibiotic containing saline solution. The leads and the pulse generator were placed in the pocket and affixed to the prepectoral fascia. The wound was then closed in 3 layers in the normal fashion.  The wound was washed drieD. And a dermabond adhesive was applied    Needle  Count, sponge counts and instrument counts were correct at the end of the procedure .   The patient tolerated the procedure without apparent complication.  Gerlene BurdockSteven C. Klein M.D.

## 2014-09-14 NOTE — Progress Notes (Signed)
Orthopedic Tech Progress Note Patient Details:  Debe Codernne S Icenogle 03/28/1918 [id number] Patient already has arm sling on. Patient ID: Debe Codernne S Woodrick, female   DOB: 05/06/1918, 81 y.o.   MRN: [id number]   Jennye MoccasinHughes, Aeric Burnham Craig 09/14/2014, 10:18 PM

## 2014-09-15 DIAGNOSIS — I10 Essential (primary) hypertension: Secondary | ICD-10-CM

## 2014-09-15 MED ORDER — OCUVITE-LUTEIN PO CAPS
1.0000 | ORAL_CAPSULE | Freq: Every day | ORAL | Status: DC
  Filled 2014-09-15: qty 1

## 2014-09-15 MED ORDER — OCUVITE-LUTEIN PO CAPS
1.0000 | ORAL_CAPSULE | Freq: Every day | ORAL | Status: DC
  Administered 2014-09-15 – 2014-09-16 (×2): 1 via ORAL
  Filled 2014-09-15 (×2): qty 1

## 2014-09-15 MED ORDER — HYDROCORTISONE 0.5 % EX CREA
TOPICAL_CREAM | CUTANEOUS | Status: DC | PRN
  Administered 2014-09-15 (×2): via TOPICAL
  Filled 2014-09-15: qty 28.35

## 2014-09-15 NOTE — Progress Notes (Signed)
Called vascular dept. To follow-up echo ordered yesterday and claimed it will be done tom. first thing in the morning.

## 2014-09-15 NOTE — Care Management Note (Addendum)
    Page 1 of 1   09/16/2014     3:05:04 PM CARE MANAGEMENT NOTE 09/16/2014  Patient:  Kara Cruz,Kara Cruz   Account Number:  [id number]  Date Initiated:  09/15/2014  Documentation initiated by:  Junius CreamerWELL,DEBBIE  Subjective/Objective Assessment:   adm w complete heart block     Action/Plan:   lives w fam, pcp dr Jarome Matindaniel paterson   Anticipated DC Date:  09/16/2014   Anticipated DC Plan:  HOME/SELF CARE         Choice offered to / List presented to:             Status of service:  Completed, signed off Medicare Important Message given?  NA - LOS <3 / Initial given by admissions (If response is "NO", the following Medicare IM given date fields will be blank) Date Medicare IM given:   Medicare IM given by:   Date Additional Medicare IM given:   Additional Medicare IM given by:    Discharge Disposition:  HOME/SELF CARE  Per UR Regulation:  Reviewed for med. necessity/level of care/duration of stay  If discussed at Long Length of Stay Meetings, dates discussed:    Comments:

## 2014-09-15 NOTE — Progress Notes (Signed)
       Patient Name: Kara Cruz      SUBJECTIVE: without complaint  Past Medical History  Diagnosis Date  . HTN (hypertension)   . Hypothyroidism   . Gout   . Dizziness     transient  . Osteoarthritis   . Frequent headaches     Scheduled Meds:  Scheduled Meds: . allopurinol  100 mg Oral Daily  . cholecalciferol  1,000 Units Oral Daily  . hydrochlorothiazide  12.5 mg Oral Daily  . labetalol  100 mg Oral BID  . levothyroxine  100 mcg Oral QAC breakfast  . losartan  100 mg Oral Daily  . mupirocin ointment   Nasal BID   Continuous Infusions:  acetaminophen, meclizine, ondansetron (ZOFRAN) IV    PHYSICAL EXAM Filed Vitals:   09/15/14 0200 09/15/14 0300 09/15/14 0400 09/15/14 0500  BP: 150/40 162/43 150/48 135/45  Pulse: 60 60 59 59  Temp:  98.2 F (36.8 C)    TempSrc:  Oral    Resp: 16 12 19 16   Height:      Weight:      SpO2: 96% 92% 95% 92%    Well developed and nourished in no acute distress HENT normal Neck supple with JVP-flat Clear Regular rate and rhythm, 2-3/6 M Abd-soft with active BS No Clubbing cyanosis edema Skin-warm and dry A & Oriented  Grossly normal sensory and motor function  TELEMETRY: Reviewed telemetry pt in P-synchronous/ AV  pacing     Intake/Output Summary (Last 24 hours) at 09/15/14 0737 Last data filed at 09/15/14 0535  Gross per 24 hour  Intake    250 ml  Output    200 ml  Net     50 ml    LABS: Basic Metabolic Panel: No results for input(s): NA, K, CL, CO2, GLUCOSE, BUN, CREATININE, CALCIUM, MG, PHOS in the last 168 hours. Cardiac Enzymes: No results for input(s): CKTOTAL, CKMB, CKMBINDEX, TROPONINI in the last 72 hours. CBC: No results for input(s): WBC, NEUTROABS, HGB, HCT, MCV, PLT in the last 168 hours. PROTIME: No results for input(s): LABPROT, INR in the last 72 hours. Liver Function Tests: No results for input(s): AST, ALT, ALKPHOS, BILITOT, PROT, ALBUMIN in the last 72 hours. No results for  input(s): LIPASE, AMYLASE in the last 72 hours. BNP: BNP (last 3 results) No results for input(s): BNP in the last 8760 hours.  ProBNP (last 3 results) No results for input(s): PROBNP in the last 8760 hours.  D-Dimer: No results for input(s): DDIMER in the last 72 hours. Hemoglobin A1C: No results for input(s): HGBA1C in the last 72 hours. Fasting Lipid Panel: No results for input(s): CHOL, HDL, LDLCALC, TRIG, CHOLHDL, LDLDIRECT in the last 72 hours. Thyroid Function Tests: No results for input(s): TSH, T4TOTAL, T3FREE, THYROIDAB in the last 72 hours.  Invalid input(s): FREET3 Anemia Panel: No results for input(s): VITAMINB12, FOLATE, FERRITIN, TIBC, IRON, RETICCTPCT in the last 72 hours.   Device Interrogation   Normal function    ASSESSMENT AND PLAN:  Active Problems:   Complete heart block   Aortic stenosis, severe s/p pacing Device dependent  Home in am Transfer today Repeat echo to assess AS in the setting of normal heart rate   Signed, Sherryl MangesSteven Amir Glaus MD  09/15/2014

## 2014-09-15 NOTE — Progress Notes (Signed)
Developed soreness to site where zoll pad in the back.. NP made aware and gave order to d/c- done. Hydrocortisone cream to be applied.

## 2014-09-15 NOTE — Progress Notes (Addendum)
1800 received from 2300 via wheelchair with daughter in attendance. Arm sling in place to L arm No. Neuro deficit noted . Denied pain .  1845 dangling legs at bedside for supper

## 2014-09-15 NOTE — Progress Notes (Signed)
Transferred to 3east room3 by wheelchair, stable, report given to RN, belongings with daughter.

## 2014-09-16 ENCOUNTER — Encounter (HOSPITAL_COMMUNITY): Payer: Self-pay | Admitting: Nurse Practitioner

## 2014-09-16 ENCOUNTER — Inpatient Hospital Stay (HOSPITAL_COMMUNITY): Payer: Medicare Other

## 2014-09-16 ENCOUNTER — Other Ambulatory Visit: Payer: Self-pay

## 2014-09-16 DIAGNOSIS — I350 Nonrheumatic aortic (valve) stenosis: Secondary | ICD-10-CM

## 2014-09-16 DIAGNOSIS — I442 Atrioventricular block, complete: Principal | ICD-10-CM

## 2014-09-16 MED ORDER — LABETALOL HCL 100 MG PO TABS: 100.0000 mg | ORAL_TABLET | Freq: Two times a day (BID) | ORAL | Status: DC

## 2014-09-16 NOTE — Progress Notes (Signed)
Chest Ray done  .[facility] Ok'd pt go home As xray read as np pneumothorax  Placement as ok

## 2014-09-16 NOTE — Progress Notes (Signed)
  Echocardiogram 2D Echocardiogram has been performed.  Leta JunglingCooper, Ronit Marczak M 09/16/2014, 8:27 AM

## 2014-09-16 NOTE — Discharge Instructions (Signed)
**  PLEASE REMEMBER TO BRING ALL OF YOUR MEDICATIONS TO EACH OF YOUR FOLLOW-UP OFFICE VISITS.     Supplemental Discharge Instructions for  Pacemaker/Defibrillator Patients  Activity No heavy lifting or vigorous activity with your left/right arm for 6 to 8 weeks.  Do not raise your left/right arm above your head for one week.  Gradually raise your affected arm as drawn below.           __       2.16             /        2.17           /        2.18          /         2.19        NO DRIVING for     ; you may begin driving on  1.612.19   .  WOUND CARE - Keep the wound area clean and dry.  Do not get this area wet for one week. No showers for one week; you may shower on 2.19    . - The tape/steri-strips on your wound will fall off; do not pull them off.  No bandage is needed on the site.  DO  NOT apply any creams, oils, or ointments to the wound area. - If you notice any drainage or discharge from the wound, any swelling or bruising at the site, or you develop a fever > 101? F after you are discharged home, call the office at once.  Special Instructions - You are still able to use cellular telephones; use the ear opposite the side where you have your pacemaker/defibrillator.  Avoid carrying your cellular phone near your device. - When traveling through airports, show security personnel your identification card to avoid being screened in the metal detectors.  Ask the security personnel to use the hand wand. - Avoid arc welding equipment, MRI testing (magnetic resonance imaging), TENS units (transcutaneous nerve stimulators).  Call the office for questions about other devices. - Avoid electrical appliances that are in poor condition or are not properly grounded. - Microwave ovens are safe to be near or to operate.

## 2014-09-16 NOTE — Discharge Summary (Signed)
ELECTROPHYSIOLOGY PROCEDURE DISCHARGE SUMMARY    Patient ID: Kara Cruz,  MRN: [id number], DOB/AGE: 79/04/1918 79 y.o.  Admit date: 09/14/2014 Discharge date: 09/16/2014  Primary Care Physician: Garlan FillersPATERSON,DANIEL G, MD Primary Cardiologist: Excell Seltzerooper Electrophysiologist: Graciela HusbandsKlein  Primary Discharge Diagnosis:  Symptomatic bradycardia (2:1 heart block) status post pacemaker implantation this admission  Secondary Discharge Diagnosis:  1.  Aortic stenosis - moderate after pacing 2.  Hypertension 3.  Hypothyroidism 4.  Gout 5.  Osteoarthritis  Allergies  Allergen Reactions  . Prednisone Other (See Comments)    REACTION: SOB, difficulty swallowing  . Codeine Sulfate Other (See Comments)    REACTION: "out of body experience"--"floats"  . Diphenhydramine Hcl Other (See Comments)    REACTION: headache, vertigo  . Doxycycline Hyclate Other (See Comments)    REACTION: nausea, vomiting  . Hibiclens [Chlorhexidine] Rash    wipes    Consultations: 1.  Calton DachMike Cooper, MD for evaluation of aortic stenosis  Procedures This Admission:  1.  Implantation of a Medtronic MRI compatible dual chamber pacemaker on 09-14-14 by [name].  The patient received a MDT Advisa pacemaker with model number [id number] right atrial and right ventricular leads.  There were no early apparent complications.  The patient is device dependent. 2.  CXR on 09-16-14 demonstrated no ptx status post device implant.  3.  Echocardiogram on 09-16-14 demonstrated normal EF, moderate AS post pacing, moderate MR, PA pressure 53.   Brief HPI: Kara Cruz is a 81 y.o. female with a past medical history as outlined above.  She was seen in her PCP's office for concerns about bradycardia in the setting of increased weakness and fatigue.  She was found to be in 2:1 heart block and was referred to EP for further evaluation.  Echocardiogram demonstrated severe AS.  Risks, benefits, and alternatives to pacemaker implantation were reviewed  with the patient who wished to proceed.    Hospital Course:  The patient was admitted and underwent implantation of a Medtronic MRI compatible dual chamber pacemaker with details as outlined above.   She was monitored on telemetry overnight which demonstrated sinus rhythm with ventricular pacing.  Because of device dependence, she was monitored in the hospital for 48 hours. She was seen by Dr Excell Seltzerooper for evaluation of severe AS.  Echocardiogram post pacing demonstrated moderate AS.  Dr Excell Seltzerooper plans a repeat echo in 6 months with follow up at that time.    Left chest was without hematoma or ecchymosis.  The device was interrogated and found to be functioning normally.  CXR was obtained and demonstrated no pneumothorax status post device implantation.  Wound care, arm mobility, and restrictions were reviewed with the patient.  Dr Johney FrameAllred examined the patient and considered them stable for discharge to home.    Discharge Vitals: Blood pressure 155/58, pulse 60, temperature 98.2 F (36.8 C), temperature source Oral, resp. rate 20, height 5\' 3"  (1.6 m), weight 156 lb 11.2 oz (71.079 kg), SpO2 97 %.   Physical Exam: Filed Vitals:   09/16/14 0456 09/16/14 0752 09/16/14 1058 09/16/14 1409  BP: 135/43 168/51 168/58 155/58  Pulse: 64 60 60 60  Temp: 98.1 F (36.7 C) 97.9 F (36.6 C) 97.5 F (36.4 C) 98.2 F (36.8 C)  TempSrc: Oral Oral Oral Oral  Resp: 20 21 18 20   Height:      Weight: 156 lb 11.2 oz (71.079 kg)     SpO2: 95% 98% 98% 97%    GEN- The patient is elderly  appearing, alert and oriented x 3 today.  Up and ambulatory Head- normocephalic, atraumatic Eyes-  Sclera clear, conjunctiva pink Ears- hearing intact Oropharynx- clear Neck- supple, Lungs- Clear to ausculation bilaterally, normal work of breathing Heart- Regular rate and rhythm, 2/6 SEM LUSB GI- soft, NT, ND, + BS Extremities- no clubbing, cyanosis, or edema  MS- no significant deformity or atrophy Skin- device pocket is  without hematoma   Labs:   Lab Results  Component Value Date   WBC 5.5 04/27/2008   HGB 13.7 04/27/2008   HCT 40.0 04/27/2008   MCV 84.6 04/27/2008   PLT 213 04/27/2008     Discharge Medications:    Medication List    TAKE these medications        allopurinol 100 MG tablet  Commonly known as:  ZYLOPRIM  Take 100 mg by mouth daily.     CALTRATE 600+D PO  Take 1 tablet by mouth 2 (two) times daily.     cholecalciferol 1000 UNITS tablet  Commonly known as:  VITAMIN D  Take 1,000 Units by mouth daily.     ICAPS Caps  Take 1 capsule by mouth 2 (two) times daily.     labetalol 100 MG tablet  Commonly known as:  NORMODYNE  Take 1 tablet (100 mg total) by mouth 2 (two) times daily.     levothyroxine 100 MCG tablet  Commonly known as:  SYNTHROID, LEVOTHROID  Take 100 mcg by mouth daily before breakfast.     losartan-hydrochlorothiazide 100-12.5 MG per tablet  Commonly known as:  HYZAAR  Take 1 tablet by mouth daily.     Lutein 20 MG Tabs  Take 20 mg by mouth daily.     meclizine 25 MG tablet  Commonly known as:  ANTIVERT  Take 25 mg by mouth 3 (three) times daily as needed for dizziness (or vertigo).     multivitamin capsule  Take 1 capsule by mouth daily.     Potassium Bicarbonate 99 MG Caps  Take 99 mg by mouth daily.     REFRESH OP  Place 1 drop into both eyes at bedtime.     VITAMIN E PO  Take 1 capsule by mouth daily.        Disposition:   Follow-up Information    Follow up with Sherryl Manges, MD On 12/23/2014.   Specialty:  Cardiology   Why:  11:30 AM   Contact information:   [address] [place] [address] [phone]       Follow up with [facility] On 09/26/2014.   Why:  12:00 PM   Contact information:   [address] [place] [address] [phone]      Duration of Discharge Encounter: Greater than 30 minutes including physician time.  Signed,  Hillis Range MD

## 2014-09-16 NOTE — Progress Notes (Signed)
Post operative chest xray not ordered s/p implant Will need cxray prior to discharge

## 2014-09-22 ENCOUNTER — Telehealth: Payer: Self-pay | Admitting: Internal Medicine

## 2014-09-22 NOTE — Telephone Encounter (Signed)
Follow up      Daughter calling back to see if she can stop taking the labetalol.

## 2014-09-22 NOTE — Telephone Encounter (Signed)
New Msg         Pt daughter calling    Pt c/o medication issue:  1. Name of Medication: Labetalol  2. How are you currently taking this medication (dosage and times per day)? 100mg  twice daily  3. Are you having a reaction (difficulty breathing--STAT)? No SOB.  Pt is having severe dry mouth and nausea Wheezing, some hoarseness, facial swelling, lips swelling, eyelids swollen, weakness and tiredness, some blurred vision.   4. What is your medication issue?  Pt daughter requesting for pt to stop medication.    Please return call.

## 2014-09-22 NOTE — Telephone Encounter (Signed)
Advised to stop Labetalol and follow up with PCP on BP. Dtr tells me that pt is scheduled to see them Monday.  Aware to call them before then if BPsymptoms worsen.

## 2014-09-26 ENCOUNTER — Ambulatory Visit (INDEPENDENT_AMBULATORY_CARE_PROVIDER_SITE_OTHER): Payer: Medicare Other | Admitting: *Deleted

## 2014-09-26 DIAGNOSIS — I442 Atrioventricular block, complete: Secondary | ICD-10-CM

## 2014-09-26 LAB — MDC_IDC_ENUM_SESS_TYPE_INCLINIC
Battery Voltage: 3.12 V
Brady Statistic AP VS Percent: 0 %
Brady Statistic AS VP Percent: 38.96 %
Brady Statistic AS VS Percent: 0.06 %
Brady Statistic RV Percent Paced: 99.93 %
Date Time Interrogation Session: 20160222121732
Lead Channel Impedance Value: 361 Ohm
Lead Channel Pacing Threshold Pulse Width: 0.4 ms
Lead Channel Pacing Threshold Pulse Width: 0.4 ms
Lead Channel Sensing Intrinsic Amplitude: 2.125 mV
Lead Channel Setting Pacing Amplitude: 3.5 V
Lead Channel Setting Pacing Pulse Width: 0.4 ms
Lead Channel Setting Sensing Sensitivity: 2 mV
MDC IDC MSMT LEADCHNL RA IMPEDANCE VALUE: 456 Ohm
MDC IDC MSMT LEADCHNL RA PACING THRESHOLD AMPLITUDE: 0.75 V
MDC IDC MSMT LEADCHNL RV IMPEDANCE VALUE: 418 Ohm
MDC IDC MSMT LEADCHNL RV IMPEDANCE VALUE: 513 Ohm
MDC IDC MSMT LEADCHNL RV PACING THRESHOLD AMPLITUDE: 0.5 V
MDC IDC MSMT LEADCHNL RV SENSING INTR AMPL: 6.875 mV
MDC IDC SET LEADCHNL RV PACING AMPLITUDE: 3.5 V
MDC IDC SET ZONE DETECTION INTERVAL: 400 ms
MDC IDC SET ZONE DETECTION INTERVAL: 400 ms
MDC IDC STAT BRADY AP VP PERCENT: 60.97 %
MDC IDC STAT BRADY RA PERCENT PACED: 60.97 %

## 2014-09-26 NOTE — Progress Notes (Signed)
Wound check appointment. Steri-strips removed. Wound without redness or edema. Incision edges approximated, wound well healed. Normal device function. Thresholds, sensing, and impedances consistent with implant measurements. Device programmed at 3.5V/auto capture programmed on for extra safety margin until 3 month visit. Histogram distribution appropriate for patient and level of activity. No mode switches or high ventricular rates noted. Patient educated about wound care, arm mobility, lifting restrictions. ROV in 3 months with SK. 

## 2014-10-06 DIAGNOSIS — Z9889 Other specified postprocedural states: Secondary | ICD-10-CM | POA: Insufficient documentation

## 2014-10-06 DIAGNOSIS — H353290 Exudative age-related macular degeneration, unspecified eye, stage unspecified: Secondary | ICD-10-CM | POA: Insufficient documentation

## 2014-10-06 DIAGNOSIS — H3589 Other specified retinal disorders: Secondary | ICD-10-CM | POA: Insufficient documentation

## 2014-10-13 ENCOUNTER — Encounter: Payer: Self-pay | Admitting: Internal Medicine

## 2014-12-23 ENCOUNTER — Ambulatory Visit (INDEPENDENT_AMBULATORY_CARE_PROVIDER_SITE_OTHER): Payer: Medicare Other | Admitting: Internal Medicine

## 2014-12-23 ENCOUNTER — Encounter: Payer: Self-pay | Admitting: Internal Medicine

## 2014-12-23 VITALS — BP 160/62 | HR 81 | Ht 63.0 in | Wt 149.6 lb

## 2014-12-23 DIAGNOSIS — Z45018 Encounter for adjustment and management of other part of cardiac pacemaker: Secondary | ICD-10-CM

## 2014-12-23 DIAGNOSIS — I442 Atrioventricular block, complete: Secondary | ICD-10-CM | POA: Diagnosis not present

## 2014-12-23 DIAGNOSIS — I4430 Unspecified atrioventricular block: Secondary | ICD-10-CM | POA: Diagnosis not present

## 2014-12-23 LAB — CUP PACEART INCLINIC DEVICE CHECK
Battery Remaining Longevity: 124 mo
Battery Voltage: 3.05 V
Brady Statistic AP VP Percent: 45.23 %
Brady Statistic AS VP Percent: 53.87 %
Brady Statistic AS VS Percent: 0.9 %
Date Time Interrogation Session: 20160520130553
Lead Channel Impedance Value: 418 Ohm
Lead Channel Impedance Value: 418 Ohm
Lead Channel Impedance Value: 494 Ohm
Lead Channel Pacing Threshold Amplitude: 0.75 V
Lead Channel Pacing Threshold Amplitude: 0.75 V
Lead Channel Pacing Threshold Pulse Width: 0.4 ms
Lead Channel Setting Pacing Amplitude: 1.5 V
Lead Channel Setting Sensing Sensitivity: 2 mV
MDC IDC MSMT LEADCHNL RA IMPEDANCE VALUE: 342 Ohm
MDC IDC MSMT LEADCHNL RA PACING THRESHOLD PULSEWIDTH: 0.4 ms
MDC IDC MSMT LEADCHNL RA SENSING INTR AMPL: 2.3 mV
MDC IDC SET LEADCHNL RV PACING AMPLITUDE: 2 V
MDC IDC SET LEADCHNL RV PACING PULSEWIDTH: 0.4 ms
MDC IDC STAT BRADY AP VS PERCENT: 0 %
MDC IDC STAT BRADY RA PERCENT PACED: 45.23 %
MDC IDC STAT BRADY RV PERCENT PACED: 99.1 %
Zone Setting Detection Interval: 400 ms
Zone Setting Detection Interval: 400 ms

## 2014-12-23 NOTE — Progress Notes (Signed)
Patient Care Team: Jarome Matinaniel Paterson, MD as PCP - General (Internal Medicine)   HPI  Kara Cruz is a 81 y.o. female Seen in follow-up for pacemaker implanted for bradycardia i.e. 2-1 heart block in the setting of bifascicular block.   Overall she feels better. She does note that she feels weak when her blood pressure falls.  She also has aortic stenosis  mean gradients are in the 40 range, however they decreased with pacing--33  Past Medical History  Diagnosis Date  . HTN (hypertension)   . Hypothyroidism   . Gout   . Dizziness     transient  . Osteoarthritis   . Frequent headaches   . AV heart block     a. 09/2014 Medtronic MRI compatible dual chamber pacemaker.    Past Surgical History  Procedure Laterality Date  . Tonsillectomy      1928  . Dilatation & curettage/hysteroscopy with myosure    . Eye injections    . Euflexa into knees    . Permanent pacemaker insertion N/A 09/14/2014    MDT MRI compatible dual chamber pacemaker implanted by [name]    Current Outpatient Prescriptions  Medication Sig Dispense Refill  . allopurinol (ZYLOPRIM) 100 MG tablet Take 100 mg by mouth daily.    . Calcium Carbonate-Vitamin D (CALTRATE 600+D PO) Take 1 tablet by mouth 2 (two) times daily.    . cholecalciferol (VITAMIN D) 1000 UNITS tablet Take 1,000 Units by mouth daily.    Marland Kitchen. levothyroxine (SYNTHROID, LEVOTHROID) 100 MCG tablet Take 100 mcg by mouth daily before breakfast. Monday - Friday ONLY    . losartan-hydrochlorothiazide (HYZAAR) 100-12.5 MG per tablet Take 1 tablet by mouth daily.    Marland Kitchen. losartan-hydrochlorothiazide (HYZAAR) 50-12.5 MG per tablet Take by mouth daily as needed. TAKE 1 TAB BY MOUTH ONLY IF BP IS HIGH EARLY IN THE DAY.    Marland Kitchen. Lutein 20 MG TABS Take 20 mg by mouth daily.     . meclizine (ANTIVERT) 25 MG tablet Take 25 mg by mouth daily as needed for dizziness (or vertigo).     . Multiple Vitamin (MULTIVITAMIN) capsule Take 1 capsule by mouth daily.    .  Polyvinyl Alcohol-Povidone (REFRESH OP) Place 1 drop into both eyes at bedtime.    . Potassium Bicarbonate 99 MG CAPS Take 99 mg by mouth daily.     Marland Kitchen. VITAMIN E PO Take 200 Units by mouth daily.      No current facility-administered medications for this visit.    Allergies  Allergen Reactions  . Labetalol Swelling    Swollen lips & eyes, blistering lips, dry mouth  . Prednisone Other (See Comments)    REACTION: SOB, difficulty swallowing  . Codeine Sulfate Other (See Comments)    REACTION: "out of body experience"--"floats"  . Diphenhydramine Hcl Other (See Comments)    REACTION: headache, vertigo  . Doxycycline Hyclate Other (See Comments)    REACTION: nausea, vomiting  . Hibiclens [Chlorhexidine] Rash    wipes    Review of Systems negative except from HPI and PMH  Physical Exam BP 160/62 mmHg  Pulse 81  Ht 5\' 3"  (1.6 m)  Wt 149 lb 9.6 oz (67.858 kg)  BMI 26.51 kg/m2 Well developed and well nourished in no acute distress HENT normal E scleral and icterus clear Neck Supple JVP flat; carotids brisk and full Clear to ausculation  Device pocket well healed; without hematoma or erythema.  There is no tethering Regular  rate and rhythm, no murmurs gallops or rub Soft with active bowel sounds No clubbing cyanosis  Edema Alert and oriented, grossly normal motor and sensory function Skin Warm and Dry  ECG demonstrates P synchronous pacing  Assessment and  Plan  High-grade heart block  Pacemaker-Medtronic  Aortic stenosis-moderate-severe  Hypertension  Overall she is doing well. Her device was reprogrammed to maximize longevity. Her blood pressure is regulated by her primary care physician with a tendency towards hypertension because of a history of orthostatic hypotension  Her aortic stenosis is likely going to be progressive. It was interesting to note that her gradient fell as her heart rate increased presumably because of the increased filling time was associated  with profound bradycardia

## 2015-03-27 ENCOUNTER — Ambulatory Visit (INDEPENDENT_AMBULATORY_CARE_PROVIDER_SITE_OTHER): Payer: Medicare Other | Admitting: *Deleted

## 2015-03-27 DIAGNOSIS — I442 Atrioventricular block, complete: Secondary | ICD-10-CM

## 2015-03-28 NOTE — Progress Notes (Signed)
Remote pacemaker transmission.   

## 2015-03-31 LAB — CUP PACEART REMOTE DEVICE CHECK
Battery Remaining Longevity: 115 mo
Battery Voltage: 3.03 V
Brady Statistic AS VP Percent: 50.91 %
Brady Statistic AS VS Percent: 0.83 %
Brady Statistic RA Percent Paced: 48.26 %
Date Time Interrogation Session: 20160822120608
Lead Channel Impedance Value: 361 Ohm
Lead Channel Impedance Value: 418 Ohm
Lead Channel Pacing Threshold Amplitude: 0.625 V
Lead Channel Pacing Threshold Amplitude: 0.75 V
Lead Channel Pacing Threshold Pulse Width: 0.4 ms
Lead Channel Sensing Intrinsic Amplitude: 2 mV
Lead Channel Sensing Intrinsic Amplitude: 2 mV
Lead Channel Sensing Intrinsic Amplitude: 8 mV
Lead Channel Setting Pacing Amplitude: 2.5 V
Lead Channel Setting Sensing Sensitivity: 2 mV
MDC IDC MSMT LEADCHNL RA PACING THRESHOLD PULSEWIDTH: 0.4 ms
MDC IDC MSMT LEADCHNL RV IMPEDANCE VALUE: 418 Ohm
MDC IDC MSMT LEADCHNL RV IMPEDANCE VALUE: 494 Ohm
MDC IDC MSMT LEADCHNL RV SENSING INTR AMPL: 8 mV
MDC IDC SET LEADCHNL RA PACING AMPLITUDE: 2 V
MDC IDC SET LEADCHNL RV PACING PULSEWIDTH: 0.4 ms
MDC IDC STAT BRADY AP VP PERCENT: 48.26 %
MDC IDC STAT BRADY AP VS PERCENT: 0 %
MDC IDC STAT BRADY RV PERCENT PACED: 99.16 %
Zone Setting Detection Interval: 400 ms
Zone Setting Detection Interval: 400 ms

## 2015-04-14 ENCOUNTER — Encounter: Payer: Self-pay | Admitting: Cardiology

## 2015-04-20 ENCOUNTER — Encounter: Payer: Self-pay | Admitting: Internal Medicine

## 2015-06-28 ENCOUNTER — Ambulatory Visit (INDEPENDENT_AMBULATORY_CARE_PROVIDER_SITE_OTHER): Payer: Medicare Other | Admitting: *Deleted

## 2015-06-28 DIAGNOSIS — I442 Atrioventricular block, complete: Secondary | ICD-10-CM

## 2015-06-28 NOTE — Progress Notes (Signed)
Remote pacemaker transmission.   

## 2015-07-10 LAB — CUP PACEART REMOTE DEVICE CHECK
Battery Remaining Longevity: 105 mo
Brady Statistic AP VS Percent: 0.01 %
Brady Statistic RA Percent Paced: 46 %
Brady Statistic RV Percent Paced: 98.95 %
Date Time Interrogation Session: 20161123153010
Implantable Lead Implant Date: 20160210
Implantable Lead Location: 753860
Implantable Lead Model: 5076
Lead Channel Impedance Value: 380 Ohm
Lead Channel Impedance Value: 418 Ohm
Lead Channel Pacing Threshold Amplitude: 0.625 V
Lead Channel Pacing Threshold Pulse Width: 0.4 ms
Lead Channel Pacing Threshold Pulse Width: 0.4 ms
Lead Channel Sensing Intrinsic Amplitude: 13.125 mV
Lead Channel Setting Pacing Amplitude: 2 V
Lead Channel Setting Pacing Pulse Width: 0.4 ms
MDC IDC LEAD IMPLANT DT: 20160210
MDC IDC LEAD LOCATION: 753859
MDC IDC MSMT BATTERY VOLTAGE: 3.02 V
MDC IDC MSMT LEADCHNL RA IMPEDANCE VALUE: 418 Ohm
MDC IDC MSMT LEADCHNL RA SENSING INTR AMPL: 2.375 mV
MDC IDC MSMT LEADCHNL RA SENSING INTR AMPL: 2.375 mV
MDC IDC MSMT LEADCHNL RV IMPEDANCE VALUE: 494 Ohm
MDC IDC MSMT LEADCHNL RV PACING THRESHOLD AMPLITUDE: 0.75 V
MDC IDC MSMT LEADCHNL RV SENSING INTR AMPL: 13.125 mV
MDC IDC SET LEADCHNL RV PACING AMPLITUDE: 2.5 V
MDC IDC SET LEADCHNL RV SENSING SENSITIVITY: 2 mV
MDC IDC STAT BRADY AP VP PERCENT: 45.99 %
MDC IDC STAT BRADY AS VP PERCENT: 52.96 %
MDC IDC STAT BRADY AS VS PERCENT: 1.04 %

## 2015-07-12 ENCOUNTER — Encounter: Payer: Self-pay | Admitting: Cardiology

## 2015-08-19 ENCOUNTER — Emergency Department (HOSPITAL_COMMUNITY)
Admission: EM | Admit: 2015-08-19 | Discharge: 2015-08-19 | Disposition: A | Discharge status: Home / Self Care | Payer: Medicare Other | Attending: Emergency Medicine | Admitting: Emergency Medicine

## 2015-08-19 ENCOUNTER — Emergency Department (HOSPITAL_COMMUNITY): Payer: Medicare Other

## 2015-08-19 ENCOUNTER — Encounter (HOSPITAL_COMMUNITY): Payer: Self-pay | Admitting: Emergency Medicine

## 2015-08-19 DIAGNOSIS — Y92000 Kitchen of unspecified non-institutional (private) residence as  the place of occurrence of the external cause: Secondary | ICD-10-CM | POA: Insufficient documentation

## 2015-08-19 DIAGNOSIS — Z87891 Personal history of nicotine dependence: Secondary | ICD-10-CM | POA: Insufficient documentation

## 2015-08-19 DIAGNOSIS — S42251A Displaced fracture of greater tuberosity of right humerus, initial encounter for closed fracture: Secondary | ICD-10-CM | POA: Diagnosis not present

## 2015-08-19 DIAGNOSIS — Y9389 Activity, other specified: Secondary | ICD-10-CM | POA: Diagnosis not present

## 2015-08-19 DIAGNOSIS — Y998 Other external cause status: Secondary | ICD-10-CM | POA: Insufficient documentation

## 2015-08-19 DIAGNOSIS — S4991XA Unspecified injury of right shoulder and upper arm, initial encounter: Secondary | ICD-10-CM | POA: Diagnosis present

## 2015-08-19 DIAGNOSIS — Z79899 Other long term (current) drug therapy: Secondary | ICD-10-CM | POA: Insufficient documentation

## 2015-08-19 DIAGNOSIS — Z91040 Latex allergy status: Secondary | ICD-10-CM | POA: Diagnosis not present

## 2015-08-19 DIAGNOSIS — W010XXA Fall on same level from slipping, tripping and stumbling without subsequent striking against object, initial encounter: Secondary | ICD-10-CM | POA: Insufficient documentation

## 2015-08-19 DIAGNOSIS — I10 Essential (primary) hypertension: Secondary | ICD-10-CM | POA: Insufficient documentation

## 2015-08-19 DIAGNOSIS — M1990 Unspecified osteoarthritis, unspecified site: Secondary | ICD-10-CM | POA: Diagnosis not present

## 2015-08-19 DIAGNOSIS — S42201A Unspecified fracture of upper end of right humerus, initial encounter for closed fracture: Secondary | ICD-10-CM

## 2015-08-19 DIAGNOSIS — E039 Hypothyroidism, unspecified: Secondary | ICD-10-CM | POA: Diagnosis not present

## 2015-08-19 DIAGNOSIS — Z950 Presence of cardiac pacemaker: Secondary | ICD-10-CM | POA: Diagnosis not present

## 2015-08-19 DIAGNOSIS — M109 Gout, unspecified: Secondary | ICD-10-CM | POA: Insufficient documentation

## 2015-08-19 MED ORDER — ACETAMINOPHEN 500 MG PO TABS
1000.0000 mg | ORAL_TABLET | Freq: Once | ORAL | Status: AC
  Administered 2015-08-19: 1000 mg via ORAL
  Filled 2015-08-19: qty 2

## 2015-08-19 NOTE — ED Notes (Signed)
Patient transported to X-ray 

## 2015-08-19 NOTE — ED Provider Notes (Signed)
CSN: [id number]     Arrival date & time 08/19/15  1033 History   First MD Initiated Contact with Patient 08/19/15 1121     Chief Complaint  Patient presents with  . Fall  . Shoulder Injury     (Consider location/radiation/quality/duration/timing/severity/associated sxs/prior Treatment) HPI 82 year old female who presents with right shoulder pain after fall. History of HTN and heart block s/p PPM. Lives at home with daughter who is currently hospitalized. Turning to answer phone today when she lost balance and landed on right shoulder. Right hand dominant. No head strike or LOC. Scooted to the door on her butt until her sister in law came to pick her up to bring to hospital. No numbness or weakness. No N/V, neck pain, back pain, chest pain, abdominal pain, or other injuries.   Past Medical History  Diagnosis Date  . HTN (hypertension)   . Hypothyroidism   . Gout   . Dizziness     transient  . Osteoarthritis   . Frequent headaches   . AV heart block     a. 09/2014 Medtronic MRI compatible dual chamber pacemaker.   Past Surgical History  Procedure Laterality Date  . Tonsillectomy      1928  . Dilatation & curettage/hysteroscopy with myosure    . Eye injections    . Euflexa into knees    . Permanent pacemaker insertion N/A 09/14/2014    MDT MRI compatible dual chamber pacemaker implanted by [name]   Family History  Problem Relation Age of Onset  . Hypertension Mother   . Arthritis Mother   . Stroke Mother   . Heart attack Father   . Stroke Brother   . Prostate cancer Brother   . Heart disease Brother   . Kidney failure Brother   . Heart disease Brother   . Arthritis Brother   . Edema Brother    Social History  Substance Use Topics  . Smoking status: Former Games developermoker  . Smokeless tobacco: None  . Alcohol Use: No   OB History    No data available     Review of Systems 10/14 systems reviewed and are negative other than those stated in the HPI    Allergies   Labetalol; Prednisone; Latex; Moxifloxacin; Pseudoephedrine hcl; Codeine sulfate; Diphenhydramine hcl; Doxycycline hyclate; and Hibiclens  Home Medications   Prior to Admission medications   Medication Sig Start Date End Date Taking? Authorizing Provider  acetaminophen (TYLENOL) 325 MG tablet Take 650 mg by mouth every 6 (six) hours as needed for moderate pain.   Yes Historical Provider, MD  allopurinol (ZYLOPRIM) 100 MG tablet Take 100 mg by mouth daily.   Yes Historical Provider, MD  Calcium Carbonate-Vitamin D (CALTRATE 600+D PO) Take 1 tablet by mouth 2 (two) times daily.   Yes Historical Provider, MD  cholecalciferol (VITAMIN D) 1000 UNITS tablet Take 1,000 Units by mouth daily.   Yes Historical Provider, MD  levothyroxine (SYNTHROID, LEVOTHROID) 100 MCG tablet Take 100 mcg by mouth daily before breakfast. Monday - Friday ONLY   Yes Historical Provider, MD  losartan-hydrochlorothiazide (HYZAAR) 100-12.5 MG per tablet Take 1 tablet by mouth daily.   Yes Historical Provider, MD  losartan-hydrochlorothiazide (HYZAAR) 50-12.5 MG per tablet Take by mouth daily as needed. TAKE 1 TAB BY MOUTH ONLY IF BP IS HIGH EARLY IN THE DAY. 11/07/14  Yes Historical Provider, MD  Lutein 20 MG TABS Take 20 mg by mouth daily.    Yes Historical Provider, MD  meclizine (  ANTIVERT) 25 MG tablet Take 25 mg by mouth daily as needed for dizziness (or vertigo).    Yes Historical Provider, MD  Multiple Vitamins-Minerals (PRESERVISION AREDS 2 PO) Take 1 capsule by mouth 2 (two) times daily.   Yes Historical Provider, MD  Polyvinyl Alcohol-Povidone (REFRESH OP) Place 1 drop into both eyes at bedtime.   Yes Historical Provider, MD  Potassium Bicarbonate 99 MG CAPS Take 99 mg by mouth daily.    Yes Historical Provider, MD  VITAMIN E PO Take 200 Units by mouth daily.    Yes Historical Provider, MD   BP 151/69 mmHg  Pulse 81  Temp(Src) 97.9 F (36.6 C) (Oral)  Resp 18  SpO2 100% Physical Exam Physical Exam  Nursing note  and vitals reviewed. Constitutional: Well developed, well nourished elderly woman, non-toxic, and in no acute distress Head: Normocephalic and atraumatic.  Mouth/Throat: Oropharynx is clear and moist.  Neck: Normal range of motion. Neck supple. No cervical spine tenderness. Cardiovascular: Normal rate and regular rhythm. +2 radial pulses bilaterally   Pulmonary/Chest: Effort normal and breath sounds normal. No chest wall tenderness  Abdominal: Soft. There is no tenderness. There is no rebound and no guarding.  Musculoskeletal: Deformity of the proximal humerus/right shoulder. Unable to ROM due to pain. Neurological: Alert, no facial droop, fluent speech, In tact innervation involving axillary, median, ulnar, and radial nerves of RUE Full strength and sensation  in lower extremities and LUE. Skin: Skin is warm and dry.  Psychiatric: Cooperative  ED Course  Procedures (including critical care time) Labs Review Labs Reviewed - No data to display  Imaging Review Dg Shoulder Right  08/19/2015  CLINICAL DATA:  Larey Seat in kitchen today onto right shoulder. Right shoulder pain. Initial encounter. EXAM: RIGHT SHOULDER - 2+ VIEW COMPARISON:  None. FINDINGS: A mildly comminuted fracture is seen involving the humeral head and greater tuberosity. Generalized osteopenia is noted. No evidence of dislocation. Generalized osteopenia noted. IMPRESSION: Mildly comminuted fracture of humeral head involving the greater tuberosity. Electronically Signed   By: Myles Rosenthal M.D.   On: 08/19/2015 12:17   Dg Humerus Right  08/19/2015  CLINICAL DATA:  Pt slipped in her kitchen today, falling on her right shoulder. Pain in superior right humerus. *Best obtainable images, pt limited in movement. EXAM: RIGHT HUMERUS - 2+ VIEW COMPARISON:  None. FINDINGS: There is a fracture at the surgical neck of the proximal RIGHT humerus with mild lateral angulation. Fracture appears to extend to the greater tuberosity. The glenohumeral  joint appears intact. IMPRESSION: Fracture of the proximal RIGHT humerus without dislocation. Electronically Signed   By: Genevive Bi M.D.   On: 08/19/2015 12:16   I have personally reviewed and evaluated these images and lab results as part of my medical decision-making.   MDM   Final diagnoses:  Proximal humerus fracture, right, closed, initial encounter    82 year old female who presents with right shoulder pain after mechanical fall. No blood thinners. Well appearing on arrival. No evidence of head or neck trauma. Mild right shoulder deformity/swelling noted, with extremity neurovascularly intact. No other injuries noted on exam. Xray of humerus/shoulder reveals right proximal humerus fracture without dislocation. Placed in sling and referral/follow-up with orthopedic surgery given. Case management also evaluated, and home health request placed. Pain was well controlled with tylenol, per patient. Strict return and follow-up instructions reviewed. She expressed understanding of all discharge instructions and felt comfortable with the plan of care.     Lavera Guise, MD  08/19/15 1837 

## 2015-08-19 NOTE — ED Notes (Signed)
Pt waiting on case manager.

## 2015-08-19 NOTE — ED Notes (Signed)
Awake. Verbally responsive. A/O x4. Resp even and unlabored. No audible adventitious breath sounds noted. ABC's intact.  

## 2015-08-19 NOTE — Discharge Instructions (Signed)
Please call orthopedic surgeon for follow-up within one  Week. Return for worsening symptoms, including worsening pain, numbness or weakness of hand/arm, or any other symptoms concerning to you.  Humerus Fracture Treated With Immobilization The humerus is the large bone in your upper arm. You have a broken (fractured) humerus. These fractures are easily diagnosed with X-rays. TREATMENT  Simple fractures which will heal without disability are treated with simple immobilization. Immobilization means you will wear a cast, splint, or sling. You have a fracture which will do well with immobilization. The fracture will heal well simply by being held in a good position until it is stable enough to begin range of motion exercises. Do not take part in activities which would further injure your arm.  HOME CARE INSTRUCTIONS   Put ice on the injured area.  Put ice in a plastic bag.  Place a towel between your skin and the bag.  Leave the ice on for 15-20 minutes, 03-04 times a day.  If you have a cast:  Do not scratch the skin under the cast using sharp or pointed objects.  Check the skin around the cast every day. You may put lotion on any red or sore areas.  Keep your cast dry and clean.  If you have a splint:  Wear the splint as directed.  Keep your splint dry and clean.  You may loosen the elastic around the splint if your fingers become numb, tingle, or turn cold or blue.  If you have a sling:  Wear the sling as directed.  Do not put pressure on any part of your cast or splint until it is fully hardened.  Your cast or splint can be protected during bathing with a plastic bag. Do not lower the cast or splint into water.  Only take over-the-counter or prescription medicines for pain, discomfort, or fever as directed by your caregiver.  Do range of motion exercises as instructed by your caregiver.  Follow up as directed by your caregiver. This is very important in order to avoid  permanent injury or disability and chronic pain. SEEK IMMEDIATE MEDICAL CARE IF:   Your skin or nails in the injured arm turn blue or gray.  Your arm feels cold or numb.  You develop severe pain in the injured arm.  You are having problems with the medicines you were given. MAKE SURE YOU:   Understand these instructions.  Will watch your condition.  Will get help right away if you are not doing well or get worse.   This information is not intended to replace advice given to you by your health care provider. Make sure you discuss any questions you have with your health care provider.   Document Released: 10/28/2000 Document Revised: 08/12/2014 Document Reviewed: 12/14/2014 Elsevier Interactive Patient Education [employer]2016 Elsevier Inc.

## 2015-08-19 NOTE — ED Notes (Signed)
Bed: [id number] Expected date: 08/19/15 Expected time: 10:38 AM Means of arrival: Ambulance Comments: Shoulder inj

## 2015-08-19 NOTE — ED Notes (Signed)
Family at bedside and requesting services to assist with pt care at home. Called and left message with SW.

## 2015-08-19 NOTE — ED Notes (Signed)
Pt arrived via EMS with report of losing balance and falling causing pain to rt shoulder and RUA. EMS applied sling and swath. Pt denies hitting head, LOC, visual disturbances or headaches. (+)PMS, CRT brisk, significant swelling and LROM but obvious deformity and bruising noted

## 2015-08-19 NOTE — ED Notes (Signed)
Awake. Verbally responsive. A/O x4. Resp even and unlabored. No audible adventitious breath sounds noted. ABC's intact. Rt arm remains in sling/swathe and ice on rt shoulder. Pt tolerating well. Family at bedside.

## 2015-08-19 NOTE — Care Management Note (Signed)
Case Management Note  Patient Details  Name: Debe Codernne S Morr MRN: [id number] Date of Birth: 02/26/1918  Subjective/Objective:                  Fall  Action/Plan: CM spoke with patient and family at the bedside. Patient needs assistance at home with bathing. [facility] and will be going to a SNF for rehab. Patient lives in a home with her daughter and fell today injuring her right shoulder. [place] [facility]   Expected Discharge Date:                  Expected Discharge Plan:     In-House Referral:     Discharge planning Services  CM Consult  Post Acute Care Choice:    Choice offered to:     DME Arranged:    DME Agency:     HH Arranged:  RN, Nurse's Aide HH Agency:  [facility]  Status of Service:  Completed, signed off  Medicare Important Message Given:    Date Medicare IM Given:    Medicare IM give by:    Date Additional Medicare IM Given:    Additional Medicare Important Message give by:     If discussed at Long Length of Stay Meetings, dates discussed:    Additional Comments:  Antony HasteBennett, Cambell Rickenbach Harris, RN 08/19/2015, 3:40 PM

## 2015-09-19 ENCOUNTER — Encounter: Payer: Self-pay | Admitting: Nurse Practitioner

## 2015-09-19 NOTE — Progress Notes (Signed)
Electrophysiology Office Note Date: 09/20/2015  ID:  LUWANDA STARR, DOB 1917-09-23, MRN [id number]  PCP: Garlan Fillers, MD Electrophysiologist: Graciela Husbands  CC: Pacemaker follow-up  Kara Cruz is a 82 y.o. female seen today for Dr Graciela Husbands.  She presents today for routine electrophysiology followup.  Since last being seen in our clinic, the patient reports doing reasonably well.  She had a mechanical fall recently while reaching for the phone and broke her arm. She denies chest pain, palpitations, dyspnea, PND, orthopnea, nausea, vomiting, dizziness, syncope, edema, weight gain, or early satiety.  Device History: MDT MRI compatible dual chamber PPM implanted 2016 for complete heart block   Past Medical History  Diagnosis Date  . HTN (hypertension)   . Hypothyroidism   . Gout   . Osteoarthritis   . Frequent headaches   . AV heart block     a. 09/2014 Medtronic MRI compatible dual chamber pacemaker.   Past Surgical History  Procedure Laterality Date  . Tonsillectomy      1928  . Dilatation & curettage/hysteroscopy with myosure    . Eye injections    . Euflexa into knees    . Permanent pacemaker insertion N/A 09/14/2014    MDT MRI compatible dual chamber pacemaker implanted by Dr Graciela Husbands    Current Outpatient Prescriptions  Medication Sig Dispense Refill  . allopurinol (ZYLOPRIM) 100 MG tablet Take 100 mg by mouth daily.    Marland Kitchen azithromycin (ZITHROMAX) 250 MG tablet Take 1 tablet by mouth daily.    . Calcium Carbonate-Vitamin D (CALTRATE 600+D PO) Take 1 tablet by mouth 2 (two) times daily.    . cholecalciferol (VITAMIN D) 1000 UNITS tablet Take 1,000 Units by mouth daily.    Marland Kitchen levothyroxine (SYNTHROID, LEVOTHROID) 100 MCG tablet Take 100 mcg by mouth daily before breakfast. Monday - Friday ONLY    . losartan-hydrochlorothiazide (HYZAAR) 100-12.5 MG per tablet Take 1 tablet by mouth daily.    Marland Kitchen losartan-hydrochlorothiazide (HYZAAR) 50-12.5 MG per tablet Take by mouth daily as  needed. TAKE 1 TAB BY MOUTH ONLY IF BP IS HIGH EARLY IN THE DAY.    Marland Kitchen Lutein 20 MG TABS Take 20 mg by mouth daily.     . meclizine (ANTIVERT) 25 MG tablet Take 25 mg by mouth daily as needed for dizziness (or vertigo).     . Multiple Vitamins-Minerals (PRESERVISION AREDS 2 PO) Take 1 capsule by mouth 2 (two) times daily.    . Polyvinyl Alcohol-Povidone (REFRESH OP) Place 1 drop into both eyes at bedtime.    . Potassium Bicarbonate 99 MG CAPS Take 99 mg by mouth daily.     Marland Kitchen VITAMIN E PO Take 200 Units by mouth daily.      No current facility-administered medications for this visit.    Allergies:   Labetalol; Prednisone; Latex; Moxifloxacin; Pseudoephedrine hcl; Codeine sulfate; Diphenhydramine hcl; Doxycycline hyclate; and Hibiclens   Social History: Social History   Social History  . Marital Status: Widowed    Spouse Name: N/A  . Number of Children: N/A  . Years of Education: N/A   Occupational History  . Not on file.   Social History Main Topics  . Smoking status: Former Games developer  . Smokeless tobacco: Not on file  . Alcohol Use: No  . Drug Use: No  . Sexual Activity: No   Other Topics Concern  . Not on file   Social History Narrative    Family History: Family History  Problem Relation Age of Onset  .  Hypertension Mother   . Arthritis Mother   . Stroke Mother   . Heart attack Father   . Stroke Brother   . Prostate cancer Brother   . Heart disease Brother   . Kidney failure Brother   . Heart disease Brother   . Arthritis Brother   . Edema Brother      Review of Systems: All other systems reviewed and are otherwise negative except as noted above.   Physical Exam: VS:  BP 150/70 mmHg  Pulse 72  Ht  (1.6 m)  Wt 142 lb (64.411 kg)  BMI 25.16 kg/m2 , BMI Body mass index is 25.16 kg/(m^2).  GEN- The patient is elderly appearing, alert and oriented x 3 today.   HEENT: normocephalic, atraumatic; sclera clear, conjunctiva pink; hearing intact; oropharynx  clear; neck supple  Lungs- Clear to ausculation bilaterally, normal work of breathing.  No wheezes, rales, rhonchi Heart- Regular rate and rhythm, 3/6 systolic murmur GI- soft, non-tender, non-distended, bowel sounds present  Extremities- no clubbing, cyanosis, or edema; DP/PT/radial pulses 1+ bilaterally MS- no significant deformity or atrophy Skin- warm and dry, no rash or lesion; PPM pocket well healed Psych- euthymic mood, full affect Neuro- strength and sensation are intact  PPM Interrogation- reviewed in detail today,  See PACEART report  EKG:  EKG is ordered today. The ekg ordered today shows sinus rhythm with ventricular pacing  Recent Labs: No results found for requested labs within last 365 days.   Wt Readings from Last 3 Encounters:  09/20/15 142 lb (64.411 kg)  12/23/14 149 lb 9.6 oz (67.858 kg)  09/16/14 156 lb 11.2 oz (71.079 kg)     Other studies Reviewed: Additional studies/ records that were reviewed today include: Dr Odessa Fleming office notes  Assessment and Plan:  1.  Complete heart block Normal PPM function See Pace Art report No changes today  2.  Aortic stenosis  Moderate by echo 09/2014 Stable symptoms  3.  Paroxysmal atrial fibrillation Identified by device interrogation Burden 1.2% today 1 episode of 12 hours, others have been shorter CHADS2VASC is 4.  She is disinclined at this point to start [facility] We reviewed risks, benefits - she is aware of risks of stroke and would like to follow With low burden, I think this is reasonable for now. We will follow remotely and call with increased AF burden.    Current medicines are reviewed at length with the patient today.   The patient does not have concerns regarding her medicines.  The following changes were made today:  none  Labs/ tests ordered today include: none   Disposition:   Follow up with [facility] transmissions, Dr Graciela Husbands 1 year     Signed, Gypsy Balsam, NP 09/20/2015 11:14 AM  [facility]  [facility] [address] [place] [address] [phone] (office) [phone] (fax)

## 2015-09-20 ENCOUNTER — Encounter: Payer: Self-pay | Admitting: Nurse Practitioner

## 2015-09-20 ENCOUNTER — Encounter: Payer: Self-pay | Admitting: Internal Medicine

## 2015-09-20 ENCOUNTER — Ambulatory Visit (INDEPENDENT_AMBULATORY_CARE_PROVIDER_SITE_OTHER): Payer: Medicare Other | Admitting: Nurse Practitioner

## 2015-09-20 VITALS — BP 150/70 | HR 72 | Ht 63.0 in | Wt 142.0 lb

## 2015-09-20 DIAGNOSIS — I480 Paroxysmal atrial fibrillation: Secondary | ICD-10-CM | POA: Diagnosis not present

## 2015-09-20 DIAGNOSIS — I350 Nonrheumatic aortic (valve) stenosis: Secondary | ICD-10-CM | POA: Diagnosis not present

## 2015-09-20 DIAGNOSIS — I442 Atrioventricular block, complete: Secondary | ICD-10-CM | POA: Diagnosis not present

## 2015-09-20 LAB — CUP PACEART INCLINIC DEVICE CHECK
Date Time Interrogation Session: 20170215151010
Implantable Lead Implant Date: 20160210
Implantable Lead Implant Date: 20160210
Implantable Lead Location: 753860
Implantable Lead Model: 5076
MDC IDC LEAD LOCATION: 753859
MDC IDC SET LEADCHNL RA PACING AMPLITUDE: 2 V
MDC IDC SET LEADCHNL RV PACING AMPLITUDE: 2.5 V
MDC IDC SET LEADCHNL RV PACING PULSEWIDTH: 0.4 ms
MDC IDC SET LEADCHNL RV SENSING SENSITIVITY: 2 mV

## 2015-09-20 NOTE — Patient Instructions (Signed)
Medication Instructions:   Your physician recommends that you continue on your current medications as directed. Please refer to the Current Medication list given to you today.  If you need a refill on your cardiac medications before your next appointment, please call your pharmacy.  Labwork:  NONE ORDER TODAY    Testing/Procedures:  NONE ORDER TODAY      Follow up :    Remote monitoring is used to monitor your Pacemaker of ICD from home. This monitoring reduces the number of office visits required to check your device to one time per year. It allows [place] to keep an eye on the functioning of your device to ensure it is working properly. You are scheduled for a device check from home on . 12/18/15 You may send your transmission at any time that day. If you have a wireless device, the transmission will be sent automatically. After your physician reviews your transmission, you will receive a postcard with your next transmission date.  Your physician wants you to follow-up in: ONE YEAR WITH Kara Cruz  You will receive a reminder letter in the mail two months in advance. If you don't receive a letter, please call our office to schedule the follow-up appointment.     Any Other Special Instructions Will Be Listed Below (If Applicable).

## 2015-11-02 ENCOUNTER — Telehealth: Payer: Self-pay | Admitting: Internal Medicine

## 2015-11-02 NOTE — Telephone Encounter (Signed)
Spoke with pt. She reports shortness of breath for last 2-3 days. Occurs with any exertion. Can walk 10 feet and will become short of breath. Also feels heart beating fast and hard with this.  Improves with rest. This is new for pt.  States she did not sleep at all last night. Was restless and couldn't catch her breath. Has chronic swelling in feet and ankles. This has not worsened. Keeps feet elevated. Does not weigh daily.  No abdominal swelling. No pain.  No cough. Will review with provider in office.

## 2015-11-02 NOTE — Telephone Encounter (Signed)
Reviewed with Penne LashAmber Seiler,NP and pt should start lopressor 12.5 mg by mouth twice daily and have office visit next week.  I spoke with pt and gave her this information. Pt scheduled to see Jacolyn ReedyMichele Lenze, PA on April 3,2017 at 11:45.  [facility] [address]  Pt has history of allergy to labetalol. Discussed with Gypsy BalsamAmber Seiler, NP and pt should not start lopressor.  Will need office visit with flex provider tomorrow.  Pt notified and appt made for her to see Cline CrockKathryn Thompson, PA on 3/31 at 11:30.

## 2015-11-02 NOTE — Telephone Encounter (Signed)
Reviewed manual transmission.  Current/presenting EGM suggests nonsustained episode of AT.  Per AT/AF summary on report, NS-AT episodes (>6 beats) have increased to 20.1/day since last check when counter was 0.6/day.  Gypsy BalsamAmber Seiler, NP and Dennie BiblePat, RN, made aware.

## 2015-11-02 NOTE — Telephone Encounter (Signed)
New Message  Pt c/o Shortness Of Breath: STAT if SOB developed within the last 24 hours or pt is noticeably SOB on the phone  1. Are you currently SOB (can you hear that pt is SOB on the phone)?   No                                      2. How long have you been experiencing SOB? 2-3 days  3. Are you SOB when sitting or when up moving around? Both but primarily when she starts walking. 4.  Are you currently experiencing any other symptoms? No except fatigue

## 2015-11-02 NOTE — Progress Notes (Addendum)
Cardiology Office Note    Date:  11/03/2015   ID:  Kara Cruz, DOB [birth year], MRN [id number]  PCP:  Garlan Fillers, MD  Cardiologist:  Dr. Graciela Husbands    SOB  History of Present Illness:  Kara Cruz is a 82 y.o. female with a history of HTN, hypothyroidism, gout, CHB s/p Medtronic PPM, mod AS and PAF (declined NOAC in past) who was added to my clinic schedule for evaluation of shortness of breath.  She was seen in the clinic by Kara Balsam NP on 09/20/15 for regular pacemaker follow-up. She was felt to be doing well from a cardiac standpoint and her pacemaker was functioning normally. It was noted that she had a history of PAF incidentally found on device interrogation. Her AF burden was very low (1.2%) and the patient also did not want to be placed on a NOAC. This was felt to be reasonable given her low AF burden.   Patient called into the office on 3/30 to report worsening shortness of breath 2-3 days. Kara Balsam NP did a remote transmission which showed nonsustained episode of AT. Per AT/AF summary on report, NS-AT episodes (>6 beats) have increased to 20.1/day since last check when counter was 0.6/day. Initially they thought they would start Lopressor 12.5 mg twice a day, however there was a history of allergy to labetalol and it was felt that the patient should come and be seen.  Today she presents to clinic for evaluation of worsening shortness of breath. This is worse with exertion and laying flat. She has been propping up on pillows recently which is abnormal for her. No dizziness or syncope. No PND. No worsening LE edema. No chest pain.     Past Medical History  Diagnosis Date  . HTN (hypertension)   . Hypothyroidism   . Gout   . Osteoarthritis   . Frequent headaches   . AV heart block     a. 09/2014 Medtronic MRI compatible dual chamber pacemaker.  . Paroxysmal atrial fibrillation (HCC)     a. identified by device interrogation  . Aortic stenosis     a. moderate      Past Surgical History  Procedure Laterality Date  . Tonsillectomy      1928  . Dilatation & curettage/hysteroscopy with myosure    . Eye injections    . Euflexa into knees    . Permanent pacemaker insertion N/A 09/14/2014    MDT MRI compatible dual chamber pacemaker implanted by Dr Graciela Husbands    Current Medications: Outpatient Prescriptions Prior to Visit  Medication Sig Dispense Refill  . allopurinol (ZYLOPRIM) 100 MG tablet Take 100 mg by mouth daily.    . Calcium Carbonate-Vitamin D (CALTRATE 600+D PO) Take 1 tablet by mouth 2 (two) times daily.    . cholecalciferol (VITAMIN D) 1000 UNITS tablet Take 1,000 Units by mouth daily.    Marland Kitchen levothyroxine (SYNTHROID, LEVOTHROID) 100 MCG tablet Take 100 mcg by mouth daily before breakfast. Monday - Friday ONLY    . Lutein 20 MG TABS Take 20 mg by mouth daily.     . meclizine (ANTIVERT) 25 MG tablet Take 25 mg by mouth daily as needed for dizziness (or vertigo).     . Multiple Vitamins-Minerals (PRESERVISION AREDS 2 PO) Take 1 capsule by mouth 2 (two) times daily.    . Polyvinyl Alcohol-Povidone (REFRESH OP) Place 1 drop into both eyes at bedtime.    . Potassium Bicarbonate 99 MG CAPS Take 99 mg by mouth  daily.     Marland Kitchen VITAMIN E PO Take 200 Units by mouth daily.     Marland Kitchen losartan-hydrochlorothiazide (HYZAAR) 100-12.5 MG per tablet Take 1 tablet by mouth daily.    Marland Kitchen losartan-hydrochlorothiazide (HYZAAR) 50-12.5 MG per tablet Take by mouth daily as needed. TAKE 1 TAB BY MOUTH ONLY IF BP IS HIGH EARLY IN THE DAY.    Marland Kitchen azithromycin (ZITHROMAX) 250 MG tablet Take 1 tablet by mouth daily.     No facility-administered medications prior to visit.     Allergies:   Labetalol; Prednisone; Latex; Moxifloxacin; Pseudoephedrine hcl; Codeine sulfate; Diphenhydramine hcl; Doxycycline hyclate; and Hibiclens   Social History   Social History  . Marital Status: Widowed    Spouse Name: N/A  . Number of Children: N/A  . Years of Education: N/A   Social  History Main Topics  . Smoking status: Former Games developer  . Smokeless tobacco: None  . Alcohol Use: No  . Drug Use: No  . Sexual Activity: No   Other Topics Concern  . None   Social History Narrative     Family History:  The patient's family history includes Arthritis in her brother and mother; Edema in her brother; Heart attack in her father; Heart disease in her brother and brother; Hypertension in her mother; Kidney failure in her brother; Prostate cancer in her brother; Stroke in her brother and mother.   ROS:   Please see the history of present illness.    ROS All other systems reviewed and are negative.   PHYSICAL EXAM:   VS:  BP 188/74 mmHg  Pulse 67  Ht  (1.6 m)  Wt 141 lb 9.6 oz (64.229 kg)  BMI 25.09 kg/m2  SpO2 98%   GEN: Well nourished, well developed, in no acute distres. Elderly and frail HEENT: normal Neck: mild JVD, carotid bruits, or masses Cardiac: RRR; soft SEM, rubs, or gallops,no edema  Respiratory:  clear to auscultation bilaterally, normal work of breathing GI: soft, nontender, nondistended, + BS MS: no deformity or atrophy Skin: warm and dry, no rash Neuro:  Alert and Oriented x 3, Strength and sensation are intact Psych: euthymic mood, full affect  Wt Readings from Last 3 Encounters:  11/03/15 141 lb 9.6 oz (64.229 kg)  09/20/15 142 lb (64.411 kg)  12/23/14 149 lb 9.6 oz (67.858 kg)      Studies/Labs Reviewed:   EKG:  EKG is ordered today.  The ekg ordered today demonstrates  Electronic PM present  Recent Labs: No results found for requested labs within last 365 days.   Lipid Panel No results found for: CHOL, TRIG, HDL, CHOLHDL, VLDL, LDLCALC, LDLDIRECT  Additional studies/ records that were reviewed today include:  2D ECHO: 09/16/2014 LV EF: 65% -  70% Study Conclusions - Left ventricle: The cavity size was normal. Systolic function was vigorous. The estimated ejection fraction was in the range of 65% to 70%. Wall motion was  normal; there were no regional wall motion abnormalities. The study is not technically sufficient to allow evaluation of LV diastolic function. - Aortic valve: Valve mobility was restricted. There was mild regurgitation. Peak gradient (S): 55 mm Hg. Valve area (VTI): 0.93 cm^2. Valve area (Vmax): 0.87 cm^2. Valve area (Vmean): 0.85 cm^2. - Aortic root: The aortic root was normal in size. - Mitral valve: There was moderate regurgitation. - Tricuspid valve: There was moderate-severe regurgitation. - Pulmonic valve: There was mild regurgitation. - Pulmonary arteries: Systolic pressure was moderately to severely increased. PA peak  pressure: 53 mm Hg (S). Impressions: - Compared to the prior study performed on 09/13/2014 the degree of aortic stenosis is now moderate with transaortic gradients: mean 31 mmHg, peak 55 Hg, previosuly mean 44 mmHg and peak 63 mmHg.    ASSESSMENT:    1. SOB (shortness of breath)   2. PAF (paroxysmal atrial fibrillation) (HCC)   3. Moderate aortic stenosis   4. Complete heart block (HCC)   5. Essential hypertension   6. Aortic stenosis      PLAN:  In order of problems listed above:  1. SOB: she does has a history of moderate aortic stenosis. I will update her 2-D echo to see if this is contributing to her worsening shortness of breath. She has also had increased  AF/AT burden on recent device interrogation. Will trial a low-dose beta blocker with close monitoring in the setting of history of allergy to labetalol. I spoke with pharmacy and they felt that starting Lopressor 12.5 mg twice a day was reasonable. She is not overtly volume overloaded however she may have mildly elevated neck veins. I will order a BNP as well as a BMP. I will stop her HCTZ and start her on Lasix 20 mg daily top see if this helps her SOB. I will have her seen back in one week for close follow-up.  2. PAF: with worsening AF/AT burden on recent device interrogation. As  above, will trial low dose Lopressor 12.5mg  BID. I disucussed with pharmacy who felt this would be okay in the setting of allergy to labetelol. We will monitor the patient closely and she will call us with any signs of swelling or airway constriction. CHADS2VASC is 4. She has disinclined at this point to start [facility]  3. Mod AS: on 2D ECHO in 09/2014. Will update echo in the setting of worsening SOB. Her murmur is soft but in the setting of previous moderate AS and worsening shortness of breath, I will repeat an echo.  4. CHB s/p PPM: Followed closely by Dr. Graciela Husbands and Kara Balsam NP. Normal PPM function  5. HTN: BP is elevated today. I rechecked her blood pressure while in the room and got a reading of 175/70. We will start Lopressor 12.5 mg twice a day as above   Medication Adjustments/Labs and Tests Ordered: Current medicines are reviewed at length with the patient today.  Concerns regarding medicines are outlined above.  Medication changes, Labs and Tests ordered today are listed in the Patient Instructions below. Patient Instructions  Medication Instructions:  Your physician has recommended you make the following change in your medication:  1.  STOP the Losartan-HCTZ 100 mg & START Losartan 100 mg daily 2.  STOP the Losartan-HCTZ 50 mg & START Losartan 50 mg daily only as needed if blood pressure is high 3.  START Lasix 20 mg taking 1 tablet daily 4.  START Metoprolol 25 mg taking 1/2 tablet twice a day  Labwork: TODAY:  BNP & BMP  Testing/Procedures: Your physician has requested that you have an echocardiogram. Echocardiography is a painless test that uses sound waves to create images of your heart. It provides your doctor with information about the size and shape of your heart and how well your heart's chambers and valves are working. This procedure takes approximately one hour. There are no restrictions for this procedure.   Follow-Up: Your physician recommends that you schedule a  follow-up appointment on:  11/10/15 WITH BRITTANY SIMMONS, PA-C    Any Other Special Instructions Will  Be Listed Below (If Applicable).   If you need a refill on your cardiac medications before your next appointment, please call your pharmacy.       Charlestine MassedSigned, Ziaire Hagos R, PA-C  11/03/2015 12:04 PM    [facility] [address], [place], [place]  [address] Phone: [phone]; Fax: [phone]

## 2015-11-02 NOTE — Telephone Encounter (Signed)
Reviewed with Gypsy BalsamAmber Seiler, NP and pt should send remote transmission. I spoke with pt and she will send transmission now

## 2015-11-03 ENCOUNTER — Telehealth: Payer: Self-pay | Admitting: Internal Medicine

## 2015-11-03 ENCOUNTER — Ambulatory Visit (INDEPENDENT_AMBULATORY_CARE_PROVIDER_SITE_OTHER): Payer: Medicare Other | Admitting: Physician Assistant

## 2015-11-03 ENCOUNTER — Encounter: Payer: Self-pay | Admitting: Physician Assistant

## 2015-11-03 VITALS — BP 188/74 | HR 67 | Ht 63.0 in | Wt 141.6 lb

## 2015-11-03 DIAGNOSIS — I442 Atrioventricular block, complete: Secondary | ICD-10-CM | POA: Diagnosis not present

## 2015-11-03 DIAGNOSIS — I480 Paroxysmal atrial fibrillation: Secondary | ICD-10-CM

## 2015-11-03 DIAGNOSIS — I10 Essential (primary) hypertension: Secondary | ICD-10-CM

## 2015-11-03 DIAGNOSIS — I350 Nonrheumatic aortic (valve) stenosis: Secondary | ICD-10-CM | POA: Diagnosis not present

## 2015-11-03 DIAGNOSIS — R0602 Shortness of breath: Secondary | ICD-10-CM | POA: Diagnosis not present

## 2015-11-03 LAB — BRAIN NATRIURETIC PEPTIDE: Brain Natriuretic Peptide: 355 pg/mL — ABNORMAL HIGH (ref <100)

## 2015-11-03 LAB — BASIC METABOLIC PANEL
BUN: 22 mg/dL (ref 7–25)
CHLORIDE: 104 mmol/L (ref 98–110)
CO2: 25 mmol/L (ref 20–31)
Calcium: 9.2 mg/dL (ref 8.6–10.4)
Creat: 0.94 mg/dL — ABNORMAL HIGH (ref 0.60–0.88)
Glucose, Bld: 86 mg/dL (ref 65–99)
Potassium: 3.8 mmol/L (ref 3.5–5.3)
Sodium: 140 mmol/L (ref 135–146)

## 2015-11-03 MED ORDER — LOSARTAN POTASSIUM 50 MG PO TABS: ORAL_TABLET | ORAL | Status: DC

## 2015-11-03 MED ORDER — METOPROLOL TARTRATE 25 MG PO TABS: 12.5000 mg | ORAL_TABLET | Freq: Two times a day (BID) | ORAL | Status: DC

## 2015-11-03 MED ORDER — FUROSEMIDE 20 MG PO TABS: 20.0000 mg | ORAL_TABLET | Freq: Every day | ORAL | Status: DC

## 2015-11-03 MED ORDER — LOSARTAN POTASSIUM 100 MG PO TABS: 100.0000 mg | ORAL_TABLET | Freq: Every day | ORAL | Status: DC

## 2015-11-03 NOTE — Telephone Encounter (Signed)
Returned pts and daughters call. They have been advised about lab results.

## 2015-11-03 NOTE — Telephone Encounter (Signed)
Will forward to Jennifer.

## 2015-11-03 NOTE — Patient Instructions (Addendum)
Medication Instructions:  Your physician has recommended you make the following change in your medication:  1.  STOP the Losartan-HCTZ 100 mg & START Losartan 100 mg daily 2.  STOP the Losartan-HCTZ 50 mg & START Losartan 50 mg daily only as needed if blood pressure is high 3.  START Lasix 20 mg taking 1 tablet daily 4.  START Metoprolol 25 mg taking 1/2 tablet twice a day  Labwork: TODAY:  BNP & BMP  Testing/Procedures: Your physician has requested that you have an echocardiogram. Echocardiography is a painless test that uses sound waves to create images of your heart. It provides your doctor with information about the size and shape of your heart and how well your heart's chambers and valves are working. This procedure takes approximately one hour. There are no restrictions for this procedure.   Follow-Up: Your physician recommends that you schedule a follow-up appointment on:  11/10/15 WITH BRITTANY SIMMONS, PA-C    Any Other Special Instructions Will Be Listed Below (If Applicable).   If you need a refill on your cardiac medications before your next appointment, please call your pharmacy.

## 2015-11-03 NOTE — Telephone Encounter (Signed)
°  Follow Up    Daughter is calling for test results. Please call.

## 2015-11-06 ENCOUNTER — Ambulatory Visit: Payer: Medicare Other | Admitting: Physician Assistant

## 2015-11-07 ENCOUNTER — Ambulatory Visit (HOSPITAL_COMMUNITY)
Admission: RE | Admit: 2015-11-07 | Discharge: 2015-11-07 | Disposition: A | Discharge status: Home / Self Care | Payer: Medicare Other | Source: Ambulatory Visit | Attending: Physician Assistant | Admitting: Physician Assistant

## 2015-11-07 DIAGNOSIS — I071 Rheumatic tricuspid insufficiency: Secondary | ICD-10-CM | POA: Diagnosis not present

## 2015-11-07 DIAGNOSIS — I340 Nonrheumatic mitral (valve) insufficiency: Secondary | ICD-10-CM | POA: Diagnosis not present

## 2015-11-07 DIAGNOSIS — I517 Cardiomegaly: Secondary | ICD-10-CM | POA: Diagnosis not present

## 2015-11-07 DIAGNOSIS — I313 Pericardial effusion (noninflammatory): Secondary | ICD-10-CM | POA: Insufficient documentation

## 2015-11-07 DIAGNOSIS — R0602 Shortness of breath: Secondary | ICD-10-CM | POA: Diagnosis not present

## 2015-11-07 DIAGNOSIS — I350 Nonrheumatic aortic (valve) stenosis: Secondary | ICD-10-CM | POA: Diagnosis present

## 2015-11-07 DIAGNOSIS — I352 Nonrheumatic aortic (valve) stenosis with insufficiency: Secondary | ICD-10-CM | POA: Insufficient documentation

## 2015-11-07 NOTE — Progress Notes (Signed)
  Echocardiogram 2D Echocardiogram has been performed.  Kara Cruz, [name] 11/07/2015, 3:58 PM

## 2015-11-10 ENCOUNTER — Encounter: Payer: Self-pay | Admitting: Cardiology

## 2015-11-10 ENCOUNTER — Ambulatory Visit (INDEPENDENT_AMBULATORY_CARE_PROVIDER_SITE_OTHER): Payer: Medicare Other | Admitting: Cardiology

## 2015-11-10 VITALS — BP 140/62 | HR 69 | Ht 63.0 in | Wt 141.0 lb

## 2015-11-10 DIAGNOSIS — R0602 Shortness of breath: Secondary | ICD-10-CM

## 2015-11-10 DIAGNOSIS — I350 Nonrheumatic aortic (valve) stenosis: Secondary | ICD-10-CM

## 2015-11-10 LAB — BASIC METABOLIC PANEL
BUN: 25 mg/dL (ref 7–25)
CHLORIDE: 108 mmol/L (ref 98–110)
CO2: 25 mmol/L (ref 20–31)
Calcium: 9.1 mg/dL (ref 8.6–10.4)
Creat: 1.34 mg/dL — ABNORMAL HIGH (ref 0.60–0.88)
Glucose, Bld: 83 mg/dL (ref 65–99)
POTASSIUM: 3.5 mmol/L (ref 3.5–5.3)
SODIUM: 144 mmol/L (ref 135–146)

## 2015-11-10 MED ORDER — FUROSEMIDE 40 MG PO TABS: 20.0000 mg | ORAL_TABLET | Freq: Every day | ORAL | Status: DC

## 2015-11-10 NOTE — Patient Instructions (Signed)
Medication Instructions:  Your physician has recommended you make the following change in your medication:  1.  INCREASE the Lasix to 40 mg taking 1 tablet daily (2 of the 20 mg tablets daily until gone is fine)   Labwork: TODAY:  BMET  Testing/Procedures: NONE ORDERED  Follow-Up: Your physician recommends that you schedule a follow-up appointment in: 2-3 WEEKS WITH 1ST AVAILABLE EXTENDER   Any Other Special Instructions Will Be Listed Below (If Applicable).     If you need a refill on your cardiac medications before your next appointment, please call your pharmacy.

## 2015-11-10 NOTE — Progress Notes (Signed)
11/10/2015 Kara CoderAnne S Cruz   10/30/1917  [id number]  Primary Physician Garlan FillersPATERSON,DANIEL G, MD Primary Cardiologist/ EP: Dr. Graciela HusbandsKlein   Reason for Visit/CC: Acute on Chronic Diastolic HF  HPI:  The patient is a 82 year old female, followed by Dr. Graciela HusbandsKlein, who presents back to clinic for follow-up regarding chronic diastolic heart failure and moderate aortic stenosis. Additional medical history includes complete heart block status post Medtronic permanent pacemaker followed by Dr. Clide CliffKline, paroxysmal atrial fibrillation (declined the NOAC in the past) hypertension, hypothyrodism and gout.   She was recently seen by Carlean JewsKatie Thompson, PA-C, 11/03/2015. At that time she complained of dyspnea as well as lower extremity edema. BNP was checked and was elevated at 877. She was felt to be slightly volume overloaded on physical exam. Subsequently her HCTZ was discontinued and she was started on low-dose Lasix at a dose of 20 mg a day. 2-D echocardiogram was rechecked to reassess her aortic stenosis. This showed moderate AS. Left ventricular ejection fraction was normal at 55-60%.   Today in follow-up, she reports that she has done okay. She notes mild improvement in symptoms but continues to feel short of breath with exertion and also complains of orthopnea. She sleeps with 2 pillows. She denies any PND. No resting dyspnea. No chest pain. Her lower extremity edema has improved. She has been trying to avoid sodium in her diet. She has been compliant with daily weights. Her weights at home have remained fairly stable. She reports good urine output on Lasix.   Current Outpatient Prescriptions  Medication Sig Dispense Refill  . allopurinol (ZYLOPRIM) 100 MG tablet Take 100 mg by mouth daily.    . Calcium Carbonate-Vitamin D (CALTRATE 600+D PO) Take 1 tablet by mouth 2 (two) times daily.    . cholecalciferol (VITAMIN D) 1000 UNITS tablet Take 1,000 Units by mouth daily.    . furosemide (LASIX) 20 MG tablet Take 1 tablet  (20 mg total) by mouth daily. 90 tablet 3  . levothyroxine (SYNTHROID, LEVOTHROID) 100 MCG tablet Take 100 mcg by mouth daily before breakfast. Monday - Friday ONLY    . losartan (COZAAR) 100 MG tablet Take 1 tablet (100 mg total) by mouth daily. 90 tablet 3  . losartan (COZAAR) 50 MG tablet Take by mouth daily as needed. TAKE 1 TAB BY MOUTH ONLY IF BP IS HIGH EARLY IN THE DAY 90 tablet 3  . Lutein 20 MG TABS Take 20 mg by mouth daily.     . meclizine (ANTIVERT) 25 MG tablet Take 25 mg by mouth daily as needed for dizziness (or vertigo).     . metoprolol tartrate (LOPRESSOR) 25 MG tablet Take 0.5 tablets (12.5 mg total) by mouth 2 (two) times daily. 180 tablet 3  . Multiple Vitamins-Minerals (PRESERVISION AREDS 2 PO) Take 1 capsule by mouth 2 (two) times daily.    . Polyvinyl Alcohol-Povidone (REFRESH OP) Place 1 drop into both eyes at bedtime.    . Potassium Bicarbonate 99 MG CAPS Take 99 mg by mouth daily.     Marland Kitchen. VITAMIN E PO Take 200 Units by mouth daily.      No current facility-administered medications for this visit.    Allergies  Allergen Reactions  . Labetalol Swelling    Swollen lips & eyes, blistering lips, dry mouth  . Prednisone Other (See Comments)    REACTION: SOB, difficulty swallowing  . Latex Itching  . Moxifloxacin Other (See Comments)    Doesn't remember  . Pseudoephedrine Hcl Other (See Comments)  .  Codeine Sulfate Other (See Comments)    REACTION: "out of body experience"--"floats"  . Diphenhydramine Hcl Other (See Comments)    REACTION: headache, vertigo  . Doxycycline Hyclate Other (See Comments)    REACTION: nausea, vomiting  . Hibiclens [Chlorhexidine] Rash    wipes    Social History   Social History  . Marital Status: Widowed    Spouse Name: N/A  . Number of Children: N/A  . Years of Education: N/A   Occupational History  . Not on file.   Social History Main Topics  . Smoking status: Former Games developer  . Smokeless tobacco: Not on file  . Alcohol  Use: No  . Drug Use: No  . Sexual Activity: No   Other Topics Concern  . Not on file   Social History Narrative     Review of Systems: General: negative for chills, fever, night sweats or weight changes.  Cardiovascular: negative for chest pain, dyspnea on exertion, edema, orthopnea, palpitations, paroxysmal nocturnal dyspnea or shortness of breath Dermatological: negative for rash Respiratory: negative for cough or wheezing Urologic: negative for hematuria Abdominal: negative for nausea, vomiting, diarrhea, bright red blood per rectum, melena, or hematemesis Neurologic: negative for visual changes, syncope, or dizziness All other systems reviewed and are otherwise negative except as noted above.    Blood pressure 140/62, pulse 69, height  (1.6 m), weight 141 lb (63.957 kg).  General appearance: alert, cooperative and no distress Neck: no carotid bruit and no JVD Lungs: clear to auscultation bilaterally Heart: irregularly irregular rhythm and regular rate Extremities: tace LEE Pulses: 2+ and symmetric Skin: warm and dry Neurologic: Grossly normal  EKG not performed  ASSESSMENT AND PLAN:   1. Chronic Diastolic HF: She still notes mild dyspnea on exertion and orthopnea. She denies resting dyspnea. Her lower extremity edema has improved with the initiation of Lasix. Recent BNP prior to start of Lasix was mildly elevated at 877. BMET showed normal renal function. We will recheck a BMP today to assess renal function and potassium. We will plan to further increase her Lasix to 40 mg daily. We discussed the importance of continuing daily weights as well as maintaining a low-sodium diet. Patient will notify us if her symptoms worsen or if she notes any increase in weight gain. We will have her follow-up in 2 weeks for repeat assessment. At that time we will obtain a follow-up BMP to ensure that renal function and electrolyte levels were stable. She is to continue losartan and  Metroprolol. Continue daily potassium sedimentation.  PLAN  F/u in 2 weeks.   Robbie Lis PA-C 11/10/2015 3:23 PM

## 2015-11-28 ENCOUNTER — Encounter: Payer: Self-pay | Admitting: Cardiology

## 2015-11-28 ENCOUNTER — Ambulatory Visit (INDEPENDENT_AMBULATORY_CARE_PROVIDER_SITE_OTHER): Payer: Medicare Other | Admitting: Cardiology

## 2015-11-28 VITALS — BP 150/70 | HR 64 | Ht 63.0 in | Wt 139.0 lb

## 2015-11-28 DIAGNOSIS — I10 Essential (primary) hypertension: Secondary | ICD-10-CM | POA: Diagnosis not present

## 2015-11-28 DIAGNOSIS — I5033 Acute on chronic diastolic (congestive) heart failure: Secondary | ICD-10-CM | POA: Diagnosis not present

## 2015-11-28 DIAGNOSIS — I350 Nonrheumatic aortic (valve) stenosis: Secondary | ICD-10-CM | POA: Diagnosis not present

## 2015-11-28 DIAGNOSIS — I480 Paroxysmal atrial fibrillation: Secondary | ICD-10-CM

## 2015-11-28 DIAGNOSIS — R0602 Shortness of breath: Secondary | ICD-10-CM

## 2015-11-28 NOTE — Progress Notes (Signed)
Cardiology Office Note   Date:  11/28/2015   ID:  Kara Cruz, DOB 1918-01-11, MRN [id number]  PCP:  Garlan Fillers, MD  Cardiologist:  Dr. Excell Seltzer EP: Dr. Graciela Husbands    Chief Complaint  Patient presents with  . Edema    improved, less SOB      History of Present Illness: Kara Cruz is a 82 y.o. female who presents for follow up of her SOB and edema.  See notes below.  She has chronic diastolic heart failure and moderate aortic stenosis. Additional medical history includes complete heart block status post Medtronic permanent pacemaker followed by Dr. Clide Cliff, paroxysmal atrial fibrillation (declined the NOAC in the past) hypertension, hypothyrodism and gout.   She was recently seen by Carlean Jews, PA-C, 11/03/2015. At that time she complained of dyspnea as well as lower extremity edema. BNP was checked and was elevated at 877. She was felt to be slightly volume overloaded on physical exam. Subsequently her HCTZ was discontinued and she was started on low-dose Lasix at a dose of 20 mg a day. 2-D echocardiogram was rechecked to reassess her aortic stenosis. This showed moderate AS. Left ventricular ejection fraction was normal at 55-60%.   Follow-up, on 11/10/15, she reported that she had done okay. She noted mild improvement in symptoms but continued to feel short of breath with exertion and also complains of orthopnea. She sleeps with 2 pillows. She denies any PND. No resting dyspnea. No chest pain. Her lower extremity edema has improved. She has been trying to avoid sodium in her diet. She has been compliant with daily weights. Her weights at home have remained fairly stable. She reported good urine output on Lasix.  Today she overall is better but BP is elevated and on her check list it is most afternoons.  She has had problems with orthostatic hypotension so do not want to lower BP significantly.     Review of her home BP checks PM BPs 147 systolic to 168 systolic.    Past  Medical History  Diagnosis Date  . HTN (hypertension)   . Hypothyroidism   . Gout   . Osteoarthritis   . Frequent headaches   . AV heart block     a. 09/2014 Medtronic MRI compatible dual chamber pacemaker.  . Paroxysmal atrial fibrillation (HCC)     a. identified by device interrogation  . Aortic stenosis     a. moderate    Past Surgical History  Procedure Laterality Date  . Tonsillectomy      1928  . Dilatation & curettage/hysteroscopy with myosure    . Eye injections    . Euflexa into knees    . Permanent pacemaker insertion N/A 09/14/2014    MDT MRI compatible dual chamber pacemaker implanted by Dr Graciela Husbands     Current Outpatient Prescriptions  Medication Sig Dispense Refill  . allopurinol (ZYLOPRIM) 100 MG tablet Take 100 mg by mouth daily.    . Calcium Carbonate-Vitamin D (CALTRATE 600+D PO) Take 1 tablet by mouth 2 (two) times daily.    . cholecalciferol (VITAMIN D) 1000 UNITS tablet Take 1,000 Units by mouth daily.    . furosemide (LASIX) 40 MG tablet Take 0.5 tablets (20 mg total) by mouth daily. 90 tablet 3  . levothyroxine (SYNTHROID, LEVOTHROID) 100 MCG tablet Take 100 mcg by mouth daily before breakfast. Monday - Friday ONLY    . losartan (COZAAR) 100 MG tablet Take 1 tablet (100 mg total) by mouth daily. 90 tablet  3  . losartan (COZAAR) 50 MG tablet Take by mouth daily as needed. TAKE 1 TAB BY MOUTH ONLY IF BP IS HIGH EARLY IN THE DAY 90 tablet 3  . Lutein 20 MG TABS Take 20 mg by mouth daily.     . meclizine (ANTIVERT) 25 MG tablet Take 25 mg by mouth daily as needed for dizziness (or vertigo).     . metoprolol tartrate (LOPRESSOR) 25 MG tablet Take 0.5 tablets (12.5 mg total) by mouth 2 (two) times daily. 180 tablet 3  . Multiple Vitamins-Minerals (PRESERVISION AREDS 2 PO) Take 1 capsule by mouth 2 (two) times daily.    . Polyvinyl Alcohol-Povidone (REFRESH OP) Place 1 drop into both eyes at bedtime.    . Potassium Bicarbonate 99 MG CAPS Take 99 mg by mouth daily.      Marland Kitchen. VITAMIN E PO Take 200 Units by mouth daily.      No current facility-administered medications for this visit.    Allergies:   Labetalol; Prednisone; Latex; Moxifloxacin; Pseudoephedrine hcl; Codeine sulfate; Diphenhydramine hcl; Doxycycline hyclate; and Hibiclens    Social History:  The patient  reports that she has quit smoking. She does not have any smokeless tobacco history on file. She reports that she does not drink alcohol or use illicit drugs.   Family History:  The patient's family history includes Arthritis in her brother and mother; Edema in her brother; Heart attack in her father; Heart disease in her brother and brother; Hypertension in her mother; Kidney failure in her brother; Prostate cancer in her brother; Stroke in her brother and mother.    ROS:  General:no colds or fevers, + weight decrease Skin:no rashes or ulcers HEENT:no blurred vision, no congestion CV:see HPI PUL:see HPI GI:no diarrhea constipation or melena, no indigestion GU:no hematuria, no dysuria MS:no joint pain, no claudication Neuro:no syncope, no lightheadedness Endo:no diabetes, + thyroid disease  Wt Readings from Last 3 Encounters:  11/28/15 139 lb (63.05 kg)  11/10/15 141 lb (63.957 kg)  11/03/15 141 lb 9.6 oz (64.229 kg)     PHYSICAL EXAM: VS:  BP 150/70 mmHg  Pulse 64  Ht 5\' 3"  (1.6 m)  Wt 139 lb (63.05 kg)  BMI 24.63 kg/m2  SpO2 99% , BMI Body mass index is 24.63 kg/(m^2). General:Pleasant affect, NAD Skin:Warm and dry, brisk capillary refill HEENT:normocephalic, sclera clear, mucus membranes moist Heart:S1S2 RRR with 3/6 aortic murmur, no gallup, rub or click Lungs:clear without rales, occ  rhonchi, no wheezes ZOX:WRUEAbd:soft, non tender, + BS, do not palpate liver spleen or masses Ext:no lower ext edema,  2+ radial pulses Neuro:alert and oriented X 3, MAE, follows commands, + facial symmetry    EKG:  EKG is NOT ordered today.    Recent Labs: 11/10/2015: BUN 25; Creat 1.34*;  Potassium 3.5; Sodium 144    Lipid Panel No results found for: CHOL, TRIG, HDL, CHOLHDL, VLDL, LDLCALC, LDLDIRECT     Other studies Reviewed: Additional studies/ records that were reviewed today include:  ECHO: Study Conclusions  - Left ventricle: The cavity size was normal. Systolic function was  normal. The estimated ejection fraction was in the range of 55%  to 60%. Wall motion was normal; there were no regional wall  motion abnormalities. Features are consistent with a pseudonormal  left ventricular filling pattern, with concomitant abnormal  relaxation and increased filling pressure (grade 2 diastolic  dysfunction). - Aortic valve: Trileaflet; moderately thickened, severely  calcified leaflets. Valve mobility was restricted. There was  moderate  stenosis. There was mild regurgitation. Peak velocity  (S): 332 cm/s. Mean gradient (S): 29 mm Hg. - Mitral valve: There was moderate regurgitation. - Left atrium: The atrium was mildly dilated. - Right ventricle: Pacer wire or catheter noted in right ventricle. - Tricuspid valve: There was moderate regurgitation. - Pulmonary arteries: Systolic pressure was severely increased. PA  peak pressure: 63 mm Hg (S). - Pericardium, extracardiac: A small pericardial effusion was  identified along the left ventricular free wall. There was no  evidence of hemodynamic compromise.  Impressions:  - Stable aortic stenosis (prior mean ).    ASSESSMENT AND PLAN:  Chronic Diastolic HF:  SOB improved.  She denies resting dyspnea. Her lower extremity edema has improved with the initiation of Lasix. Recent BNP prior to start of Lasix was mildly elevated at 877. BMET showed normal renal function. BMP last visit with with slightly elevated Cr.  Her lasix was increased  to 40 mg daily. She is better today.  She did not wish to have BMP checked today.  Will plan in 1-2 weeks. We discussed the importance of continuing daily weights  as well as maintaining a low-sodium diet. Patient will notify us if her symptoms worsen or if she notes any increase in weight gain.     2. PAF  -- regular HR today.  Has refused anticoagulaton.  3. Moderate AS.  Will have her follow up with Dr. Excell Seltzer in 8 weeks.    4. Hypertension with hx of orthostatic hypotension.  She will take her pm losartan is pm BP > 135.  She has not been taking.      Current medicines are reviewed with the patient today.  The patient Has no concerns regarding medicines.  The following changes have been made:  See above Labs/ tests ordered today include:see above  Disposition:   FU:  see above  Signed, [name], NP  11/28/2015 3:24 PM    [facility] [address] [place], [place], [place]  [address]/ [address] [place], [place] Phone: [phone]; Fax: [phone]  [phone]

## 2015-11-28 NOTE — Patient Instructions (Signed)
Medication Instructions:  Continue the PM dose of Losartan if your blood pressure is is 135 or greater Continue Lasix 40 mg taking 1 daily  Labwork: None ordered  Testing/Procedures: None ordered  Follow-Up: Your physician recommends that you schedule a follow-up appointment in: 8 WEEKS WITH DR. Excell SeltzerOOPER   Any Other Special Instructions Will Be Listed Below (If Applicable).    If you need a refill on your cardiac medications before your next appointment, please call your pharmacy.

## 2015-11-30 ENCOUNTER — Telehealth: Payer: Self-pay | Admitting: Cardiovascular Disease

## 2015-11-30 NOTE — Telephone Encounter (Signed)
Informed Kara Cruz (pt's dtr), ok per DPR on file, that patient should follow Kara Seltzerooper for valve and Kara Cruz for device. She is aware of this and ok if they see both.   Made her aware that Kara Seltzerooper and his nurse are not in the office today and message will be sent to his nurse to call them to arrange OV w/ Kara Seltzerooper. She thanks me for helping.

## 2015-11-30 NOTE — Telephone Encounter (Signed)
New Message  Pt daughter called to request an appt with Dr. Burt Knack in 8 weeks. Declined PA and NP.  Per Daughter Dr. Caryl Comes Request to turn over this pt to Dr. Burt Knack. Per daughter, this was said after the pacer was placed in. Per daughter the pt met Dr. Burt Knack in Recovery.  Please call back to discuss

## 2015-12-04 NOTE — Telephone Encounter (Signed)
I spoke with the pt's daughter and made her aware that Dr Excell Seltzerooper recommends that she continue to follow-up with [name] and if she develops further issues with aortic stenosis then [name] can refer the pt to Dr Excell Seltzerooper.  Britta MccreedyBarbara agreed with plan. Per Britta MccreedyBarbara the pt has seen a PA/NP the past 4 visits and she has not seen [name] in a year (12/23/14).  I arranged appointment in July with [name].

## 2015-12-04 NOTE — Telephone Encounter (Signed)
Left message on machine for Kara MccreedyBarbara to contact the office.   Tonny BollmanMichael Cooper, MD  Iona CoachLauren W Brown, RN; Leone BrandLaura R Ingold, NP     Phone Number: [phone]            With moderate aortic stenosis at age 82, I don't think I'll add much to her care. Probably best to FU with [name]. thx

## 2015-12-18 ENCOUNTER — Ambulatory Visit (INDEPENDENT_AMBULATORY_CARE_PROVIDER_SITE_OTHER): Payer: Medicare Other | Admitting: *Deleted

## 2015-12-18 DIAGNOSIS — I442 Atrioventricular block, complete: Secondary | ICD-10-CM | POA: Diagnosis not present

## 2015-12-18 NOTE — Progress Notes (Signed)
Remote pacemaker transmission.   

## 2016-01-05 LAB — CUP PACEART REMOTE DEVICE CHECK
Battery Remaining Longevity: 92 mo
Battery Voltage: 3.01 V
Brady Statistic AP VP Percent: 68.22 %
Brady Statistic AP VS Percent: 0.01 %
Brady Statistic RV Percent Paced: 99.65 %
Date Time Interrogation Session: 20170515124031
Implantable Lead Implant Date: 20160210
Implantable Lead Location: 753860
Implantable Lead Model: 5076
Lead Channel Impedance Value: 361 Ohm
Lead Channel Impedance Value: 437 Ohm
Lead Channel Pacing Threshold Amplitude: 0.75 V
Lead Channel Pacing Threshold Amplitude: 0.75 V
Lead Channel Pacing Threshold Pulse Width: 0.4 ms
Lead Channel Sensing Intrinsic Amplitude: 1.125 mV
Lead Channel Setting Pacing Amplitude: 2 V
Lead Channel Setting Pacing Amplitude: 2.5 V
Lead Channel Setting Sensing Sensitivity: 2 mV
MDC IDC LEAD IMPLANT DT: 20160210
MDC IDC LEAD LOCATION: 753859
MDC IDC MSMT LEADCHNL RA IMPEDANCE VALUE: 342 Ohm
MDC IDC MSMT LEADCHNL RA IMPEDANCE VALUE: 456 Ohm
MDC IDC MSMT LEADCHNL RA SENSING INTR AMPL: 1.125 mV
MDC IDC MSMT LEADCHNL RV PACING THRESHOLD PULSEWIDTH: 0.4 ms
MDC IDC MSMT LEADCHNL RV SENSING INTR AMPL: 9.5 mV
MDC IDC MSMT LEADCHNL RV SENSING INTR AMPL: 9.5 mV
MDC IDC SET LEADCHNL RV PACING PULSEWIDTH: 0.4 ms
MDC IDC STAT BRADY AS VP PERCENT: 31.43 %
MDC IDC STAT BRADY AS VS PERCENT: 0.34 %
MDC IDC STAT BRADY RA PERCENT PACED: 68.23 %

## 2016-01-17 ENCOUNTER — Encounter: Payer: Self-pay | Admitting: Cardiology

## 2016-02-15 ENCOUNTER — Encounter: Payer: Self-pay | Admitting: Internal Medicine

## 2016-02-15 ENCOUNTER — Ambulatory Visit (INDEPENDENT_AMBULATORY_CARE_PROVIDER_SITE_OTHER): Payer: Medicare Other | Admitting: Internal Medicine

## 2016-02-15 VITALS — BP 136/58 | HR 69 | Ht 63.0 in | Wt 137.0 lb

## 2016-02-15 DIAGNOSIS — I480 Paroxysmal atrial fibrillation: Secondary | ICD-10-CM

## 2016-02-15 DIAGNOSIS — I5033 Acute on chronic diastolic (congestive) heart failure: Secondary | ICD-10-CM | POA: Diagnosis not present

## 2016-02-15 DIAGNOSIS — I442 Atrioventricular block, complete: Secondary | ICD-10-CM

## 2016-02-15 DIAGNOSIS — Z950 Presence of cardiac pacemaker: Secondary | ICD-10-CM | POA: Diagnosis not present

## 2016-02-15 DIAGNOSIS — I350 Nonrheumatic aortic (valve) stenosis: Secondary | ICD-10-CM

## 2016-02-15 NOTE — Patient Instructions (Signed)
Medication Instructions: - Your physician has recommended you make the following change in your medication:  1) Stop metoprolol  Labwork: - none  Procedures/Testing: - none  Follow-Up: - Remote monitoring is used to monitor your Pacemaker of ICD from home. This monitoring reduces the number of office visits required to check your device to one time per year. It allows us to keep an eye on the functioning of your device to ensure it is working properly. You are scheduled for a device check from home on 05/16/16. You may send your transmission at any time that day. If you have a wireless device, the transmission will be sent automatically. After your physician reviews your transmission, you will receive a postcard with your next transmission date.  - Your physician wants you to follow-up in: 1 year with Dr. Graciela HusbandsKlein. You will receive a reminder letter in the mail two months in advance. If you don't receive a letter, please call our office to schedule the follow-up appointment.   Any Additional Special Instructions Will Be Listed Below (If Applicable).     If you need a refill on your cardiac medications before your next appointment, please call your pharmacy.

## 2016-02-15 NOTE — Progress Notes (Signed)
Patient Care Team: Jarome Matinaniel Paterson, MD as PCP - General (Internal Medicine)   HPI  Kara Cruz is a 82 y.o. female Seen in follow-up for pacemaker implanted for bradycardia i.e. 2-1 heart block in the setting of bifascicular block.   Overall she feels better.  She had some atrial fib earlier this year, but has been disinclined to use anticoagulation  Metoprolol was started and she has noted an decrease in energy and some increased breathlessness  She also has aortic stenosis  mean gradients are in the 40 range, however they decreased with pacing--33  Past Medical History  Diagnosis Date  . HTN (hypertension)   . Hypothyroidism   . Gout   . Osteoarthritis   . Frequent headaches   . AV heart block     a. 09/2014 Medtronic MRI compatible dual chamber pacemaker.  . Paroxysmal atrial fibrillation (HCC)     a. identified by device interrogation  . Aortic stenosis     a. moderate    Past Surgical History  Procedure Laterality Date  . Tonsillectomy      1928  . Dilatation & curettage/hysteroscopy with myosure    . Eye injections    . Euflexa into knees    . Permanent pacemaker insertion N/A 09/14/2014    MDT MRI compatible dual chamber pacemaker implanted by [name]    Current Outpatient Prescriptions  Medication Sig Dispense Refill  . allopurinol (ZYLOPRIM) 100 MG tablet Take 100 mg by mouth daily.    . cholecalciferol (VITAMIN D) 1000 UNITS tablet Take 1,000 Units by mouth daily.    . furosemide (LASIX) 40 MG tablet Take 0.5 tablets (20 mg total) by mouth daily. 90 tablet 3  . levothyroxine (SYNTHROID, LEVOTHROID) 100 MCG tablet Take 100 mcg by mouth daily before breakfast. Monday - Friday ONLY    . losartan (COZAAR) 100 MG tablet Take 1 tablet (100 mg total) by mouth daily. 90 tablet 3  . losartan (COZAAR) 50 MG tablet Take by mouth daily as needed. TAKE 1 TAB BY MOUTH ONLY IF BP IS HIGH EARLY IN THE DAY 90 tablet 3  . Lutein 20 MG TABS Take 20 mg by mouth  daily.     . meclizine (ANTIVERT) 25 MG tablet Take 25 mg by mouth daily as needed for dizziness (or vertigo).     . Multiple Vitamins-Minerals (PRESERVISION AREDS 2 PO) Take 1 capsule by mouth 2 (two) times daily.    . Polyvinyl Alcohol-Povidone (REFRESH OP) Place 1 drop into both eyes at bedtime.    . Potassium Bicarbonate 99 MG CAPS Take 99 mg by mouth daily.     Marland Kitchen. VITAMIN E PO Take 200 Units by mouth daily.      No current facility-administered medications for this visit.    Allergies  Allergen Reactions  . Labetalol Swelling    Swollen lips & eyes, blistering lips, dry mouth  . Prednisone Other (See Comments)    REACTION: SOB, difficulty swallowing  . Latex Itching  . Moxifloxacin Other (See Comments)    Doesn't remember  . Pseudoephedrine Hcl Other (See Comments)  . Codeine Sulfate Other (See Comments)    REACTION: "out of body experience"--"floats"  . Diphenhydramine Hcl Other (See Comments)    REACTION: headache, vertigo  . Doxycycline Hyclate Other (See Comments)    REACTION: nausea, vomiting  . Hibiclens [Chlorhexidine] Rash    wipes    Review of Systems negative except from HPI and PMH  Physical  Exam BP 136/58 mmHg  Pulse 69  Ht  (1.6 m)  Wt 137 lb (62.143 kg)  BMI 24.27 kg/m2  SpO2 99% Well developed and well nourished in no acute distress HENT normal E scleral and icterus clear Neck Supple JVP flat; carotids brisk and full Clear to ausculation  Device pocket well healed; without hematoma or erythema.  There is no tethering  Regular rate and rhythm, no murmurs gallops or rub Soft with active bowel sounds No clubbing cyanosis  Edema Alert and oriented, grossly normal motor and sensory function Skin Warm and Dry  ECG demonstrates P synchronous pacing  Assessment and  Plan  High-grade heart block  Pacemaker-Medtronic  The patient's device was interrogated.  The information was reviewed. No changes were made in the programming.     Atrial  fibrillation paroxysmal  Aortic stenosis-moderate-severe  Hypertension  Fatigue   Overall she is well.   With fatigue since the initation of metoprolol and decreased activity per her device, will stop it.  Her BP records from home, 120-140 so will not add back decreased losartan

## 2016-03-07 ENCOUNTER — Encounter: Payer: Self-pay | Admitting: Internal Medicine

## 2016-03-12 MED FILL — ALLOPURINOL 100 MG TABLET: 100 | 90 days supply | Qty: 90 | Fill #0

## 2016-03-21 ENCOUNTER — Telehealth: Payer: Self-pay

## 2016-03-21 ENCOUNTER — Telehealth: Payer: Self-pay | Admitting: Internal Medicine

## 2016-03-21 NOTE — Telephone Encounter (Signed)
Called pt back, pt c/o of SOB x 2 weeks, states she "cant sleep well at night" Pt stated that she had been taking all her medications as prescribed. Pt denies fever or any coughing up sputum. Advised pt to send in transmission it would be reviewed and symptoms will be f/u with SK.

## 2016-03-21 NOTE — Telephone Encounter (Signed)
New message       Pt c/o Shortness Of Breath: STAT if SOB developed within the last 24 hours or pt is noticeably SOB on the phone  1. Are you currently SOB (can you hear that pt is SOB on the phone)? A little sob now  2. How long have you been experiencing SOB? For a few days  3. Are you SOB when sitting or when up moving around? both 4. Are you currently experiencing any other symptoms?  Not sleeping  Pt has a pacemaker.

## 2016-03-21 NOTE — Telephone Encounter (Signed)
Transmission received only change AT/AF burden from 1.3% ( 02/15/16 OV) to 2.7%.

## 2016-03-21 NOTE — Telephone Encounter (Signed)
Called pt back and let her know that transmission and symptoms were reviewed with Dr. Graciela HusbandsKlein and advised her to come in and see a provider next week. Apt made for Monday Aug. 21 at 1:00pm with Francis Dowseenee Ursuy. Pt voiced understanding. Informed pt that if SOB gets worse in the interim to go to ED. Pt voiced understanding.

## 2016-03-21 NOTE — Telephone Encounter (Signed)
Called pt back and let her know that transmission was received and that [facility] Koren BoundKleins nurse to review with him. Informed pt that if SOB worsened then she should go to the Emergency Room. Pt voiced understanding.

## 2016-03-22 ENCOUNTER — Encounter: Payer: Self-pay | Admitting: Physician Assistant

## 2016-03-22 NOTE — Telephone Encounter (Signed)
followup arranged next week with RU

## 2016-03-24 NOTE — Progress Notes (Signed)
Cardiology Office Note Date:  03/25/2016  Patient ID:  Kara Cruz, DOB 07/19/1918, MRN [id number] PCP:  Garlan FillersPATERSON,DANIEL G, MD  Cardiologist:  Dr. Graciela HusbandsKlein    Chief Complaint: SOB  History of Present Illness: Kara Cruz is a 82 y.o. female with history of CHB w/ PPM, PAFib declined a/c, VHD w/ AS, HTN, hypothyroidism, comes to the office today to be seen for Dr. Graciela HusbandsKlein.   She last saw him 02/15/16, at that time c/o fatigue with the initiation of metoprolol and was discontinued.  She comes today accompanied by her daughter, the patient reports that last week, Wed-Thursday felt particularly SOB with minimal exertion (no rest SOB), she has some degree of DOE that is her baseline, though was worse then usual.  Since then has returned back to her baseline.  She has no rest SOB, denies symptoms of orthopnea/PD, however she sleeps with 3 pillows, and has for perhaps a year.  She mentions significant insomnia.  She states she is not going to take any "dope" to help her sleep though, it does not sound like SOB is the etiology, in-fact states when she is in bed feels like her breathing settles down good.  She reports more that she just cannot fall asleep.  She has not observed any particular edema or unusual swelling, bloating, her weight has been stable, she denies any significant salt in her diet.  She denies any kind of CP, palpitations, no dizziness, near syncope or syncope.  Device information: MDT dual chamber PPM, implanted 09/14/14, Dr. Graciela HusbandsKlein, CHB   Past Medical History:  Diagnosis Date  . Aortic stenosis    a. moderate  . AV heart block    a. 09/2014 Medtronic MRI compatible dual chamber pacemaker.  . Frequent headaches   . Gout   . HTN (hypertension)   . Hypothyroidism   . Osteoarthritis   . Paroxysmal atrial fibrillation (HCC)    a. identified by device interrogation    Past Surgical History:  Procedure Laterality Date  . DILATATION & CURETTAGE/HYSTEROSCOPY WITH MYOSURE    .  euflexa into knees    . eye injections    . PERMANENT PACEMAKER INSERTION N/A 09/14/2014   MDT MRI compatible dual chamber pacemaker implanted by [name]  . TONSILLECTOMY     1928    Current Outpatient Prescriptions  Medication Sig Dispense Refill  . allopurinol (ZYLOPRIM) 100 MG tablet Take 100 mg by mouth daily.    . cholecalciferol (VITAMIN D) 1000 UNITS tablet Take 1,000 Units by mouth daily.    . furosemide (LASIX) 40 MG tablet Take 0.5 tablets (20 mg total) by mouth daily. 90 tablet 3  . levothyroxine (SYNTHROID, LEVOTHROID) 100 MCG tablet Take 100 mcg by mouth daily before breakfast. Monday - Friday ONLY    . losartan (COZAAR) 100 MG tablet Take 1 tablet (100 mg total) by mouth daily. 90 tablet 3  . losartan (COZAAR) 50 MG tablet Take by mouth daily as needed. TAKE 1 TAB BY MOUTH ONLY IF BP IS HIGH EARLY IN THE DAY 90 tablet 3  . Lutein 20 MG TABS Take 20 mg by mouth daily.     . meclizine (ANTIVERT) 25 MG tablet Take 25 mg by mouth daily as needed for dizziness (or vertigo).     . Multiple Vitamins-Minerals (PRESERVISION AREDS 2 PO) Take 1 capsule by mouth 2 (two) times daily.    . Polyvinyl Alcohol-Povidone (REFRESH OP) Place 1 drop into both eyes at bedtime.    .Marland Kitchen  Potassium Bicarbonate 99 MG CAPS Take 99 mg by mouth daily.      No current facility-administered medications for this visit.     Allergies:   Labetalol; Prednisone; Latex; Moxifloxacin; Pseudoephedrine hcl; Codeine sulfate; Diphenhydramine hcl; Doxycycline hyclate; and Hibiclens [chlorhexidine]   Social History:  The patient  reports that she has quit smoking. She has never used smokeless tobacco. She reports that she does not drink alcohol or use drugs.   Family History:  The patient's family history includes Arthritis in her brother and mother; Edema in her brother; Heart attack in her father; Heart disease in her brother and brother; Hypertension in her mother; Kidney failure in her brother; Prostate cancer in her  brother; Stroke in her brother and mother.  ROS:  Please see the history of present illness.  All other systems are reviewed and otherwise negative.   PHYSICAL EXAM:  VS:  BP 116/64   Pulse 79   Ht 5\' 3"  (1.6 m)   Wt 136 lb (61.7 kg)   BMI 24.09 kg/m  BMI: Body mass index is 24.09 kg/m. Well nourished, well developed, in no acute distress  HEENT: normocephalic, atraumatic  Neck: no JVD, carotid bruits or masses Cardiac:   RRR; 2-3/6 SM, LSB, no rubs, or gallops Lungs:  clear to auscultation bilaterally, no wheezing, rhonchi or rales  Abd: soft, nontender, MS: no deformity or atrophy Ext: trace-1+ posterior ankle edema only (per patient and daughter unchanged from baseline) Skin: warm and dry, no rash Neuro:  No gross deficits appreciated Psych: euthymic mood, full affect  PPM site is stable, no tethering or discomfort   EKG:  Done today and reviewed by myself shows SR/Vpaced, AV paced beats, PVC/PAC PPM interrogation today: normal device function, AF episodes noted, 2.6%, one 17hour AFib episode, 98% V paced   11/07/15: Echocardiogram Study Conclusions - Left ventricle: The cavity size was normal. Systolic function was   normal. The estimated ejection fraction was in the range of 55%   to 60%. Wall motion was normal; there were no regional wall   motion abnormalities. Features are consistent with a pseudonormal   left ventricular filling pattern, with concomitant abnormal   relaxation and increased filling pressure (grade 2 diastolic   dysfunction). - Aortic valve: Trileaflet; moderately thickened, severely   calcified leaflets. Valve mobility was restricted. There was   moderate stenosis. There was mild regurgitation. Peak velocity   (S): 332 cm/s. Mean gradient (S): 29 mm Hg. - Mitral valve: There was moderate regurgitation. - Left atrium: The atrium was mildly dilated. - Right ventricle: Pacer wire or catheter noted in right ventricle. - Tricuspid valve: There was  moderate regurgitation. - Pulmonary arteries: Systolic pressure was severely increased. PA   peak pressure: 63 mm Hg (S). - Pericardium, extracardiac: A small pericardial effusion was   identified along the left ventricular free wall. There was no   evidence of hemodynamic compromise. Impressions: - Stable aortic stenosis (prior mean 31mmHg).  2D ECHO: 09/16/2014 LV EF: 65% -  70% Study Conclusions - Left ventricle: The cavity size was normal. Systolic function was vigorous. The estimated ejection fraction was in the range of 65% to 70%. Wall motion was normal; there were no regional wall motion abnormalities. The study is not technically sufficient to allow evaluation of LV diastolic function. - Aortic valve: Valve mobility was restricted. There was mild regurgitation. Peak gradient (S): 55 mm Hg. Valve area (VTI): 0.93 cm^2. Valve area (Vmax): 0.87 cm^2. Valve area (Vmean): 0.85  cm^2. - Aortic root: The aortic root was normal in size. - Mitral valve: There was moderate regurgitation. - Tricuspid valve: There was moderate-severe regurgitation. - Pulmonic valve: There was mild regurgitation. - Pulmonary arteries: Systolic pressure was moderately to severely increased. PA peak pressure: 53 mm Hg (S). Impressions: - Compared to the prior study performed on 09/13/2014 the degree of aortic stenosis is now moderate with transaortic gradients: mean 31 mmHg, peak 55 Hg, previosuly mean 44 mmHg and peak 63 mmHg.  Recent Labs: 11/03/2015: Brain Natriuretic Peptide 355.0 11/10/2015: BUN 25; Creat 1.34; Potassium 3.5; Sodium 144  No results found for requested labs within last 8760 hours.   CrCl cannot be calculated (Patient's most recent lab result is older than the maximum 21 days allowed.).   Wt Readings from Last 3 Encounters:  03/25/16 136 lb (61.7 kg)  02/15/16 137 lb (62.1 kg)  11/28/15 139 lb (63 kg)     Other studies reviewed: Additional studies/records  reviewed today include: summarized above  ASSESSMENT AND PLAN:  1. SOB/DOE     Her exam does not suggest fluid OL, lungs are very clear, minimal edema that is unchanged for the patient     Weight is stable (down 5 lbs from April, down 1 lb from last month)     Likely her AS  2. PAFib     CHA2DS2Vasc is at least 4, had declined a/c     Intolerant of BB/lopresor with fatigue, reports allergy to labetalol  3. Hx of 2:1 heart block/bifasicular block/PPM     Normal device function, no changes made  4. HTN     Stable  5. Aortic Stenosis     Moderate by echo earlier this year  Discussed with Dr. Excell Seltzer (DOD), who has happened to have seen her historically in the hospital for her AS, the patient reports she would not be open to the idea of any kind of procedure, he suggests updating her echo and see him in the next month or so to discuss further.    Disposition: Dr. Excell Seltzer 4-6 weeks, she is instructed if she has recurrent acute worsening of her SOB to let us know, or seek medical attention at the ER if needed.  Dr. Graciela Husbands in 1 year.  Current medicines are reviewed at length with the patient today.  The patient did not have any concerns regarding medicines.  Judith Blonder, PA-C 03/25/2016 3:57 PM     [facility] [address] [place] [address] [phone] (office)  [phone] (fax)

## 2016-03-25 ENCOUNTER — Encounter: Payer: Self-pay | Admitting: Physician Assistant

## 2016-03-25 ENCOUNTER — Ambulatory Visit (INDEPENDENT_AMBULATORY_CARE_PROVIDER_SITE_OTHER): Payer: Medicare Other | Admitting: Physician Assistant

## 2016-03-25 VITALS — BP 116/64 | HR 79 | Ht 63.0 in | Wt 136.0 lb

## 2016-03-25 DIAGNOSIS — I4430 Unspecified atrioventricular block: Secondary | ICD-10-CM

## 2016-03-25 DIAGNOSIS — I480 Paroxysmal atrial fibrillation: Secondary | ICD-10-CM

## 2016-03-25 DIAGNOSIS — R0602 Shortness of breath: Secondary | ICD-10-CM

## 2016-03-25 DIAGNOSIS — I350 Nonrheumatic aortic (valve) stenosis: Secondary | ICD-10-CM | POA: Diagnosis not present

## 2016-03-25 DIAGNOSIS — I10 Essential (primary) hypertension: Secondary | ICD-10-CM | POA: Diagnosis not present

## 2016-03-25 NOTE — Patient Instructions (Signed)
Medication Instructions:   Your physician recommends that you continue on your current medications as directed. Please refer to the Current Medication list given to you today.   If you need a refill on your cardiac medications before your next appointment, please call your pharmacy.  Labwork: NONE ORDER TODAY    Testing/Procedures: Your physician has requested that you have an echocardiogram. Echocardiography is a painless test that uses sound waves to create images of your heart. It provides your doctor with information about the size and shape of your heart and how well your heart's chambers and valves are working. This procedure takes approximately one hour. There are no restrictions for this procedure.   Follow-Up:  WITH DR COOPER 4 TO 6 WEEKS ECHO FOLLOW UP FOR AORTIC STENOSIS PER COOPER   Your physician wants you to follow-up in: ONE YEAR WITH Graciela HusbandsKLEIN  You will receive a reminder letter in the mail two months in advance. If you don't receive a letter, please call our office to schedule the follow-up appointment.     Any Other Special Instructions Will Be Listed Below (If Applicable).

## 2016-03-27 ENCOUNTER — Telehealth: Payer: Self-pay | Admitting: Internal Medicine

## 2016-03-27 NOTE — Telephone Encounter (Signed)
I spoke with the Kara Cruz's daughter and arranged follow-up appointment with Dr Excell Seltzerooper on 04/22/16 at 2:00.

## 2016-03-27 NOTE — Telephone Encounter (Signed)
New Message  Pts daughter voiced MD Excell SeltzerCooper requested to see her mother and to contact Lauren to schedule and appt.  Please follow up with pt. Thanks!

## 2016-04-03 ENCOUNTER — Other Ambulatory Visit: Payer: Self-pay

## 2016-04-03 ENCOUNTER — Ambulatory Visit (HOSPITAL_COMMUNITY): Payer: Medicare Other | Attending: Cardiovascular Disease

## 2016-04-03 DIAGNOSIS — I071 Rheumatic tricuspid insufficiency: Secondary | ICD-10-CM | POA: Insufficient documentation

## 2016-04-03 DIAGNOSIS — I517 Cardiomegaly: Secondary | ICD-10-CM | POA: Diagnosis not present

## 2016-04-03 DIAGNOSIS — I4891 Unspecified atrial fibrillation: Secondary | ICD-10-CM | POA: Insufficient documentation

## 2016-04-03 DIAGNOSIS — I350 Nonrheumatic aortic (valve) stenosis: Secondary | ICD-10-CM | POA: Insufficient documentation

## 2016-04-03 DIAGNOSIS — I352 Nonrheumatic aortic (valve) stenosis with insufficiency: Secondary | ICD-10-CM | POA: Insufficient documentation

## 2016-04-03 DIAGNOSIS — I371 Nonrheumatic pulmonary valve insufficiency: Secondary | ICD-10-CM | POA: Diagnosis not present

## 2016-04-03 DIAGNOSIS — I340 Nonrheumatic mitral (valve) insufficiency: Secondary | ICD-10-CM | POA: Insufficient documentation

## 2016-04-11 ENCOUNTER — Telehealth: Payer: Self-pay | Admitting: *Deleted

## 2016-04-11 NOTE — Telephone Encounter (Signed)
SPOKE TO PT DAUGHTER ABOUT RESULTS AND AT DR Excell SeltzerOOPER F/U VISIT SCHEDULED WILL DISCUSS IN DETAIL MORE ABOUT ECHO RESULTS

## 2016-04-22 ENCOUNTER — Ambulatory Visit (INDEPENDENT_AMBULATORY_CARE_PROVIDER_SITE_OTHER): Payer: Medicare Other | Admitting: Cardiovascular Disease

## 2016-04-22 VITALS — BP 150/60 | HR 80 | Ht 62.0 in | Wt 133.6 lb

## 2016-04-22 DIAGNOSIS — I350 Nonrheumatic aortic (valve) stenosis: Secondary | ICD-10-CM | POA: Diagnosis not present

## 2016-04-22 NOTE — Progress Notes (Signed)
Cardiology Office Note Date:  04/22/2016   ID:  Kara Cruz, DOB 06-18-1918, MRN [id number]  PCP:  Garlan Fillers, MD  Cardiologist:  [name], MD    Chief Complaint  Patient presents with  . Aortic Stenosis     History of Present Illness: Kara Cruz is a 82 y.o. female who presents for Evaluation of aortic stenosis. I initially saw her in consultation in February 2016 when she presented with complete heart block and required permanent pacemaker placement. At that time she was noted to have a loud systolic heart murmur and was diagnosed with severe calcific aortic stenosis by echo assessment.  At the time her aortic stenosis was in the borderline moderate to severe range and at age 59 with minimal symptoms she declined further treatment. However, at the time of recent evaluation she admitted to worsening exertional dyspnea. An echocardiogram shows some decline in her overall LV function in the presence of severe aortic stenosis. She returns today for further discussion.  The patient is here today with her daughter. She complains of shortness of breath with minimal activity. She denies chest pain or pressure, lightheadedness, or syncope. She has no orthopnea or PND.   Past Medical History:  Diagnosis Date  . Aortic stenosis    a. moderate  . AV heart block    a. 09/2014 Medtronic MRI compatible dual chamber pacemaker.  . Frequent headaches   . Gout   . HTN (hypertension)   . Hypothyroidism   . Osteoarthritis   . Paroxysmal atrial fibrillation (HCC)    a. identified by device interrogation    Past Surgical History:  Procedure Laterality Date  . DILATATION & CURETTAGE/HYSTEROSCOPY WITH MYOSURE    . euflexa into knees    . eye injections    . PERMANENT PACEMAKER INSERTION N/A 09/14/2014   MDT MRI compatible dual chamber pacemaker implanted by Dr Graciela Husbands  . TONSILLECTOMY     1928    Current Outpatient Prescriptions  Medication Sig Dispense Refill  . allopurinol  (ZYLOPRIM) 100 MG tablet Take 100 mg by mouth daily.    . cholecalciferol (VITAMIN D) 1000 UNITS tablet Take 1,000 Units by mouth daily.    . furosemide (LASIX) 40 MG tablet Take 0.5 tablets (20 mg total) by mouth daily. 90 tablet 3  . levothyroxine (SYNTHROID, LEVOTHROID) 100 MCG tablet Take 100 mcg by mouth daily before breakfast. Monday - Friday ONLY    . losartan (COZAAR) 100 MG tablet Take 1 tablet (100 mg total) by mouth daily. 90 tablet 3  . losartan (COZAAR) 50 MG tablet Take by mouth daily as needed. TAKE 1 TAB BY MOUTH ONLY IF BP IS HIGH EARLY IN THE DAY 90 tablet 3  . Lutein 20 MG TABS Take 20 mg by mouth daily.     . meclizine (ANTIVERT) 25 MG tablet Take 25 mg by mouth daily as needed for dizziness (or vertigo).     . Multiple Vitamins-Minerals (PRESERVISION AREDS 2 PO) Take 1 capsule by mouth 2 (two) times daily.    . Polyvinyl Alcohol-Povidone (REFRESH OP) Place 1 drop into both eyes at bedtime.    . Potassium Bicarbonate 99 MG CAPS Take 99 mg by mouth daily.      No current facility-administered medications for this visit.     Allergies:   Labetalol; Prednisone; Latex; Moxifloxacin; Pseudoephedrine hcl; Codeine sulfate; Diphenhydramine hcl; Doxycycline hyclate; and Hibiclens [chlorhexidine]   Social History:  The patient  reports that she has quit smoking.  She has never used smokeless tobacco. She reports that she does not drink alcohol or use drugs.   Family History:  The patient's  family history includes Arthritis in her brother and mother; Edema in her brother; Heart attack in her father; Heart disease in her brother and brother; Hypertension in her mother; Kidney failure in her brother; Prostate cancer in her brother; Stroke in her brother and mother.    ROS:  Please see the history of present illness.  Otherwise, review of systems is positive for Balance problems.  All other systems are reviewed and negative.    PHYSICAL EXAM: VS:  BP (!) 150/60   Pulse 80   Ht 5\' 2"   (1.575 m)   Wt 133 lb 9.6 oz (60.6 kg)   SpO2 98%   BMI 24.44 kg/m  , BMI Body mass index is 24.44 kg/m. GEN: Well nourished, well developed, pleasant elderly woman in no acute distress  HEENT: normal  Neck: no JVD, no masses.  Cardiac: RRR 3/6 harsh systolic crescendo decrescendo murmur best heard at the right upper sternal border              Respiratory:  clear to auscultation bilaterally, normal work of breathing GI: soft, nontender, nondistended, + BS MS: no deformity or atrophy  Ext: Trace pretibial edema, pedal pulses 2+= bilaterally Skin: warm and dry, no rash Neuro:  Strength and sensation are intact Psych: euthymic mood, full affect  EKG:  EKG is not ordered today.  Recent Labs: 11/03/2015: Brain Natriuretic Peptide 355.0 11/10/2015: BUN 25; Creat 1.34; Potassium 3.5; Sodium 144   Lipid Panel  No results found for: CHOL, TRIG, HDL, CHOLHDL, VLDL, LDLCALC, LDLDIRECT    Wt Readings from Last 3 Encounters:  04/22/16 133 lb 9.6 oz (60.6 kg)  03/25/16 136 lb (61.7 kg)  02/15/16 137 lb (62.1 kg)     Cardiac Studies Reviewed: 2D Echo 04-03-2016: Study Conclusions  - Left ventricle: The cavity size was normal. There was moderate   focal basal hypertrophy of the septum. Systolic function was   mildly to moderately reduced. The estimated ejection fraction was   in the range of 40% to 45%. Diffuse hypokinesis. - Ventricular septum: Septal motion showed abnormal function and   dyssynergy. - Aortic valve: Valve mobility was restricted. There was moderate   to severe stenosis. There was moderate regurgitation. Peak   velocity (S): 397 cm/s. Mean gradient (S): 31 mm Hg. - Mitral valve: There was moderate regurgitation. - Left atrium: The atrium was severely dilated. - Tricuspid valve: There was mild-moderate regurgitation. - Pulmonic valve: There was moderate regurgitation. - Pulmonary arteries: PA peak pressure: 63 mm Hg (S).  Impressions:  - LVEF may be  underestimated due to frequent ectopy and LBBB.   Unable to view images from 11/2015, but by report systolic   function has worsened since that time.  ASSESSMENT AND PLAN: This is a 82 year old woman with suspected stage D, severe symptomatic aortic stenosis. The patient's transaortic valve gradients are in the moderate range, but there is some decline in left ventricular function which could possibly be related to pacing or aortic stenosis. I have reviewed her echo images and I think she clearly has progressive aortic stenosis as her valve is severely calcified and restricted with a dimensionless index of 0.18 which is highly suggestive of severe aortic stenosis (<0.25). She certainly has symptoms suggestive of progressive aortic stenosis with associated chronic diastolic heart failure New York Heart Association functional class III shortness  of breath.  I have reviewed the natural history of aortic stenosis with the patient and her daughter who is present today. We have discussed the limitations of medical therapy and the poor prognosis associated with symptomatic aortic stenosis. We have reviewed potential treatment options, including palliative medical therapy, conventional surgical aortic valve replacement, and transcatheter aortic valve replacement. We discussed treatment options in the context of this patient's specific comorbid medical conditions as well as her advanced. She obviously is not a candidate for conventional surgery at age 5. However, it would be reasonable to consider transcatheter aortic valve replacement. I reviewed the typical evaluation and treatment course. The patient is very clear that she is not interested in undergoing any invasive testing such as cardiac catheterization and she is also not interested in further treatment for aortic stenosis. She inquires about medical therapy and I have discussed the typical progression of worsening shortness of breath and heart failure with  her. She understands that treatment is limited to diuretic therapy and control of blood pressure. A palliative approach to her care is in line with her wishes. I would be happy to see her back in the future if I can help.  Current medicines are reviewed with the patient today.  The patient does not have concerns regarding medicines.  Labs/ tests ordered today include:  No orders of the defined types were placed in this encounter.  Disposition:   FU as needed  Signed, [name], MD  04/22/2016 2:36 PM    [facility] [address] [place], [place], [place]  [address] Phone: [phone]; Fax: [phone]

## 2016-04-22 NOTE — Patient Instructions (Signed)
Medication Instructions:  Your physician recommends that you continue on your current medications as directed. Please refer to the Current Medication list given to you today.  Labwork: No new orders.   Testing/Procedures: No new orders.   Follow-Up: Your physician recommends that you schedule a follow-up appointment as needed with  Dr Cooper.    Any Other Special Instructions Will Be Listed Below (If Applicable).     If you need a refill on your cardiac medications before your next appointment, please call your pharmacy.   

## 2016-04-25 ENCOUNTER — Encounter: Payer: Self-pay | Admitting: Cardiovascular Disease

## 2016-05-17 ENCOUNTER — Ambulatory Visit (INDEPENDENT_AMBULATORY_CARE_PROVIDER_SITE_OTHER): Payer: Medicare Other | Admitting: *Deleted

## 2016-05-17 ENCOUNTER — Telehealth: Payer: Self-pay | Admitting: Cardiology

## 2016-05-17 DIAGNOSIS — I442 Atrioventricular block, complete: Secondary | ICD-10-CM | POA: Diagnosis not present

## 2016-05-17 NOTE — Progress Notes (Signed)
Remote pacemaker transmission.   

## 2016-05-17 NOTE — Telephone Encounter (Signed)
LMOVM reminding pt to send remote transmission.   

## 2016-05-22 ENCOUNTER — Encounter: Payer: Self-pay | Admitting: Cardiology

## 2016-06-18 LAB — CUP PACEART REMOTE DEVICE CHECK
Battery Voltage: 3.01 V
Brady Statistic AP VP Percent: 43.13 %
Brady Statistic AS VP Percent: 54.61 %
Brady Statistic RA Percent Paced: 43.26 %
Brady Statistic RV Percent Paced: 97.74 %
Implantable Lead Implant Date: 20160210
Implantable Lead Location: 753860
Implantable Lead Model: 5076
Implantable Lead Model: 5076
Implantable Pulse Generator Implant Date: 20160210
Lead Channel Impedance Value: 323 Ohm
Lead Channel Impedance Value: 342 Ohm
Lead Channel Impedance Value: 437 Ohm
Lead Channel Pacing Threshold Pulse Width: 0.4 ms
Lead Channel Sensing Intrinsic Amplitude: 7.625 mV
Lead Channel Setting Pacing Amplitude: 2 V
Lead Channel Setting Pacing Pulse Width: 0.4 ms
Lead Channel Setting Sensing Sensitivity: 2 mV
MDC IDC LEAD IMPLANT DT: 20160210
MDC IDC LEAD LOCATION: 753859
MDC IDC MSMT BATTERY REMAINING LONGEVITY: 88 mo
MDC IDC MSMT LEADCHNL RA IMPEDANCE VALUE: 475 Ohm
MDC IDC MSMT LEADCHNL RA PACING THRESHOLD AMPLITUDE: 0.875 V
MDC IDC MSMT LEADCHNL RA SENSING INTR AMPL: 1 mV
MDC IDC MSMT LEADCHNL RA SENSING INTR AMPL: 1 mV
MDC IDC MSMT LEADCHNL RV PACING THRESHOLD AMPLITUDE: 0.625 V
MDC IDC MSMT LEADCHNL RV PACING THRESHOLD PULSEWIDTH: 0.4 ms
MDC IDC MSMT LEADCHNL RV SENSING INTR AMPL: 7.625 mV
MDC IDC SESS DTM: 20171013133943
MDC IDC SET LEADCHNL RV PACING AMPLITUDE: 2.5 V
MDC IDC STAT BRADY AP VS PERCENT: 0.13 %
MDC IDC STAT BRADY AS VS PERCENT: 2.12 %

## 2016-07-08 ENCOUNTER — Telehealth: Payer: Self-pay | Admitting: Internal Medicine

## 2016-07-08 DIAGNOSIS — I350 Nonrheumatic aortic (valve) stenosis: Secondary | ICD-10-CM

## 2016-07-08 MED ORDER — FUROSEMIDE 40 MG PO TABS: 40.0000 mg | ORAL_TABLET | Freq: Every day | ORAL | 1 refills | Status: DC

## 2016-07-08 NOTE — Telephone Encounter (Signed)
Pt states she does not weigh daily, denies puffiness other areas of her body.  Pt states she is taking lasix 20mg  daily (1/2 of a 40mg  tablet) and an OTC potassium supplement. Pt advised I will review with Dr Excell Seltzerooper.

## 2016-07-08 NOTE — Telephone Encounter (Signed)
Pt advised, verbalized understanding, BMET scheduled 07/23/16

## 2016-07-08 NOTE — Telephone Encounter (Signed)
Reviewed with Dr Cooper-increase lasix to 40mg  daily, increase OTC potassium from 1 tablet daily to 2 tablets daily, BMET in 2 weeks.

## 2016-07-08 NOTE — Telephone Encounter (Signed)
Patient states she is taking Lasix 20 mg daily.  She states her legs, ankles and feet are swollen.   Please call.

## 2016-07-08 NOTE — Telephone Encounter (Signed)
Pt states she has lower extremity edema for about 10 days. Pt states swelling is up to knees, pt denies increase in shortness of breath.

## 2016-07-23 ENCOUNTER — Other Ambulatory Visit: Payer: Medicare Other | Admitting: *Deleted

## 2016-07-23 ENCOUNTER — Encounter: Payer: Self-pay | Admitting: Cardiology

## 2016-07-23 ENCOUNTER — Other Ambulatory Visit: Payer: Medicare Other

## 2016-07-23 ENCOUNTER — Ambulatory Visit (INDEPENDENT_AMBULATORY_CARE_PROVIDER_SITE_OTHER): Payer: Medicare Other | Admitting: Interventional Cardiology

## 2016-07-23 ENCOUNTER — Encounter (INDEPENDENT_AMBULATORY_CARE_PROVIDER_SITE_OTHER): Payer: Self-pay

## 2016-07-23 ENCOUNTER — Inpatient Hospital Stay (HOSPITAL_COMMUNITY)
Admission: AD | Admit: 2016-07-23 | Discharge: 2016-07-30 | DRG: 291 | Disposition: A | Discharge status: Home / Self Care | Payer: Medicare Other | Source: Ambulatory Visit | Attending: Interventional Cardiology | Admitting: Interventional Cardiology

## 2016-07-23 VITALS — BP 138/78 | HR 53 | Ht 62.0 in | Wt 152.0 lb

## 2016-07-23 DIAGNOSIS — N183 Chronic kidney disease, stage 3 (moderate): Secondary | ICD-10-CM | POA: Diagnosis present

## 2016-07-23 DIAGNOSIS — I5023 Acute on chronic systolic (congestive) heart failure: Secondary | ICD-10-CM | POA: Diagnosis present

## 2016-07-23 DIAGNOSIS — Z885 Allergy status to narcotic agent status: Secondary | ICD-10-CM | POA: Diagnosis not present

## 2016-07-23 DIAGNOSIS — E876 Hypokalemia: Secondary | ICD-10-CM | POA: Diagnosis present

## 2016-07-23 DIAGNOSIS — Z881 Allergy status to other antibiotic agents status: Secondary | ICD-10-CM | POA: Diagnosis not present

## 2016-07-23 DIAGNOSIS — Z950 Presence of cardiac pacemaker: Secondary | ICD-10-CM | POA: Diagnosis not present

## 2016-07-23 DIAGNOSIS — Z91040 Latex allergy status: Secondary | ICD-10-CM

## 2016-07-23 DIAGNOSIS — Z91048 Other nonmedicinal substance allergy status: Secondary | ICD-10-CM | POA: Diagnosis not present

## 2016-07-23 DIAGNOSIS — I5033 Acute on chronic diastolic (congestive) heart failure: Secondary | ICD-10-CM | POA: Diagnosis not present

## 2016-07-23 DIAGNOSIS — I10 Essential (primary) hypertension: Secondary | ICD-10-CM | POA: Diagnosis present

## 2016-07-23 DIAGNOSIS — Z888 Allergy status to other drugs, medicaments and biological substances status: Secondary | ICD-10-CM

## 2016-07-23 DIAGNOSIS — I442 Atrioventricular block, complete: Secondary | ICD-10-CM | POA: Diagnosis present

## 2016-07-23 DIAGNOSIS — N179 Acute kidney failure, unspecified: Secondary | ICD-10-CM | POA: Diagnosis present

## 2016-07-23 DIAGNOSIS — I5043 Acute on chronic combined systolic (congestive) and diastolic (congestive) heart failure: Secondary | ICD-10-CM | POA: Diagnosis not present

## 2016-07-23 DIAGNOSIS — E039 Hypothyroidism, unspecified: Secondary | ICD-10-CM | POA: Diagnosis present

## 2016-07-23 DIAGNOSIS — I130 Hypertensive heart and chronic kidney disease with heart failure and stage 1 through stage 4 chronic kidney disease, or unspecified chronic kidney disease: Principal | ICD-10-CM | POA: Diagnosis present

## 2016-07-23 DIAGNOSIS — I2721 Secondary pulmonary arterial hypertension: Secondary | ICD-10-CM

## 2016-07-23 DIAGNOSIS — I480 Paroxysmal atrial fibrillation: Secondary | ICD-10-CM | POA: Diagnosis present

## 2016-07-23 DIAGNOSIS — I350 Nonrheumatic aortic (valve) stenosis: Secondary | ICD-10-CM | POA: Diagnosis present

## 2016-07-23 DIAGNOSIS — I272 Pulmonary hypertension, unspecified: Secondary | ICD-10-CM | POA: Diagnosis present

## 2016-07-23 HISTORY — DX: Unspecified macular degeneration: H35.30

## 2016-07-23 HISTORY — DX: Acute on chronic systolic (congestive) heart failure: I50.23

## 2016-07-23 LAB — BASIC METABOLIC PANEL
Anion gap: 10 (ref 5–15)
BUN: 37 mg/dL — AB (ref 6–20)
CO2: 23 mmol/L (ref 22–32)
CREATININE: 1.37 mg/dL — AB (ref 0.44–1.00)
Calcium: 9.3 mg/dL (ref 8.9–10.3)
Chloride: 106 mmol/L (ref 101–111)
GFR calc Af Amer: 36 mL/min — ABNORMAL LOW (ref >60)
GFR calc non Af Amer: 31 mL/min — ABNORMAL LOW (ref >60)
Glucose, Bld: 104 mg/dL — ABNORMAL HIGH (ref 65–99)
Potassium: 3.2 mmol/L — ABNORMAL LOW (ref 3.5–5.1)
Sodium: 139 mmol/L (ref 135–145)

## 2016-07-23 LAB — CBC
HCT: 37.5 % (ref 36.0–46.0)
Hemoglobin: 12 g/dL (ref 12.0–15.0)
MCH: 26.6 pg (ref 26.0–34.0)
MCHC: 32 g/dL (ref 30.0–36.0)
MCV: 83.1 fL (ref 78.0–100.0)
Platelets: 197 10*3/uL (ref 150–400)
RBC: 4.51 MIL/uL (ref 3.87–5.11)
RDW: 16.4 % — AB (ref 11.5–15.5)
WBC: 5.2 10*3/uL (ref 4.0–10.5)

## 2016-07-23 LAB — TSH: TSH: 13.438 u[IU]/mL — ABNORMAL HIGH (ref 0.350–4.500)

## 2016-07-23 MED ORDER — SODIUM CHLORIDE 0.9 % IV SOLN: 250.0000 mL | INTRAVENOUS | Status: DC | PRN

## 2016-07-23 MED ORDER — SODIUM CHLORIDE 0.9% FLUSH: 3.0000 mL | INTRAVENOUS | Status: DC | PRN

## 2016-07-23 MED ORDER — LUTEIN 20 MG PO TABS: 20.0000 mg | ORAL_TABLET | Freq: Every day | ORAL | Status: DC

## 2016-07-23 MED ORDER — LOSARTAN POTASSIUM 50 MG PO TABS
100.0000 mg | ORAL_TABLET | Freq: Every day | ORAL | Status: DC
  Administered 2016-07-24: 100 mg via ORAL
  Filled 2016-07-23: qty 2

## 2016-07-23 MED ORDER — ALLOPURINOL 100 MG PO TABS
100.0000 mg | ORAL_TABLET | Freq: Every day | ORAL | Status: DC
  Administered 2016-07-24 – 2016-07-30 (×7): 100 mg via ORAL
  Filled 2016-07-23 (×7): qty 1

## 2016-07-23 MED ORDER — FUROSEMIDE 10 MG/ML IJ SOLN
40.0000 mg | Freq: Three times a day (TID) | INTRAMUSCULAR | Status: AC
  Administered 2016-07-23 – 2016-07-24 (×3): 40 mg via INTRAVENOUS
  Filled 2016-07-23 (×3): qty 4

## 2016-07-23 MED ORDER — SODIUM CHLORIDE 0.9% FLUSH
3.0000 mL | Freq: Two times a day (BID) | INTRAVENOUS | Status: DC
  Administered 2016-07-23 – 2016-07-29 (×12): 3 mL via INTRAVENOUS

## 2016-07-23 MED ORDER — ACETAMINOPHEN 325 MG PO TABS: 650.0000 mg | ORAL_TABLET | ORAL | Status: DC | PRN

## 2016-07-23 MED ORDER — LEVOTHYROXINE SODIUM 100 MCG PO TABS: 100.0000 ug | ORAL_TABLET | Freq: Every day | ORAL | Status: DC

## 2016-07-23 MED ORDER — ONDANSETRON HCL 4 MG/2ML IJ SOLN: 4.0000 mg | Freq: Four times a day (QID) | INTRAMUSCULAR | Status: DC | PRN

## 2016-07-23 MED ORDER — LEVOTHYROXINE SODIUM 100 MCG PO TABS
100.0000 ug | ORAL_TABLET | ORAL | Status: DC
  Administered 2016-07-24: 100 ug via ORAL
  Filled 2016-07-23: qty 1

## 2016-07-23 MED ORDER — POTASSIUM BICARBONATE 99 MG PO CAPS: 99.0000 mg | ORAL_CAPSULE | Freq: Two times a day (BID) | ORAL | Status: DC

## 2016-07-23 MED ORDER — HEPARIN SODIUM (PORCINE) 5000 UNIT/ML IJ SOLN
5000.0000 [IU] | Freq: Three times a day (TID) | INTRAMUSCULAR | Status: DC
  Administered 2016-07-23 – 2016-07-30 (×11): 5000 [IU] via SUBCUTANEOUS
  Filled 2016-07-23 (×12): qty 1

## 2016-07-23 MED ORDER — MECLIZINE HCL 25 MG PO TABS: 25.0000 mg | ORAL_TABLET | Freq: Every day | ORAL | Status: DC | PRN

## 2016-07-23 NOTE — Patient Instructions (Signed)
  [facility] [address] [place] Go to the Main Entrance A[facility], you will be directed to Admitting.

## 2016-07-23 NOTE — Progress Notes (Signed)
07/23/2016 Debe CoderAnne S Asby   06/02/1918  [id number]  Primary Physician Garlan FillersPATERSON,DANIEL G, MD Primary Cardiologist: Dr. Excell Seltzerooper  Electrophysiologist: Dr. Graciela HusbandsKlein   Reason for Visit/CC: Lower Extremity Edema  HPI:  The patient is a 82 y/o female, followed by Dr. Excell Seltzerooper and Dr. Graciela HusbandsKlein, with a h/o severe stage D aortic stenosis, who recently refused intervention (TAVR offered). She also has chronic systolic HF with echo 04/03/16 showing an EF of 40-45%. She also has a PPM given h/o CHB. She also has HTN.   She has had recent issues with worsening LEE and weight gain. She also has exertional dyspnea but denies orthopnea and PND. No chest pain, syncope/ near syncope. She recently was advised by Dr. Excell Seltzerooper to increase her Lasix to 40 mg daily, however she has had no improvement with this. Her edema has progressed to the level of her thighs.      Current Meds  Medication Sig  . allopurinol (ZYLOPRIM) 100 MG tablet Take 100 mg by mouth daily.  . cholecalciferol (VITAMIN D) 1000 UNITS tablet Take 1,000 Units by mouth daily.  . furosemide (LASIX) 40 MG tablet Take 1 tablet (40 mg total) by mouth daily.  Marland Kitchen. levothyroxine (SYNTHROID, LEVOTHROID) 100 MCG tablet Take 100 mcg by mouth daily before breakfast. Monday - Friday ONLY  . losartan (COZAAR) 100 MG tablet Take 1 tablet (100 mg total) by mouth daily.  Marland Kitchen. losartan (COZAAR) 50 MG tablet Take by mouth daily as needed. TAKE 1 TAB BY MOUTH ONLY IF BP IS HIGH EARLY IN THE DAY  . Lutein 20 MG TABS Take 20 mg by mouth daily.   . meclizine (ANTIVERT) 25 MG tablet Take 25 mg by mouth daily as needed for dizziness (or vertigo).   . Multiple Vitamins-Minerals (PRESERVISION AREDS 2 PO) Take 1 capsule by mouth 2 (two) times daily.  . Polyvinyl Alcohol-Povidone (REFRESH OP) Place 1 drop into both eyes at bedtime.  . Potassium Bicarbonate 99 MG CAPS Take 1 capsule (99 mg total) by mouth 2 (two) times daily.   Allergies  Allergen Reactions  . Labetalol Swelling   Blisters around lips Swollen lips & eyes, blistering lips, dry mouth  . Prednisone Other (See Comments)    REACTION: SOB, difficulty swallowing  . Latex Itching  . Moxifloxacin Other (See Comments)    Doesn't remember  . Pseudoephedrine Hcl Other (See Comments)  . Codeine Sulfate Other (See Comments)    REACTION: "out of body experience"--"floats"  . Diphenhydramine Hcl Other (See Comments)    REACTION: headache, vertigo  . Doxycycline Hyclate Other (See Comments)    REACTION: nausea, vomiting  . Hibiclens [Chlorhexidine] Rash    wipes   Past Medical History:  Diagnosis Date  . Aortic stenosis    a. moderate  . AV heart block    a. 09/2014 Medtronic MRI compatible dual chamber pacemaker.  . Frequent headaches   . Gout   . HTN (hypertension)   . Hypothyroidism   . Osteoarthritis   . Paroxysmal atrial fibrillation (HCC)    a. identified by device interrogation   Family History  Problem Relation Age of Onset  . Hypertension Mother   . Arthritis Mother   . Stroke Mother   . Heart attack Father   . Stroke Brother   . Prostate cancer Brother   . Heart disease Brother   . Kidney failure Brother   . Heart disease Brother   . Arthritis Brother   . Edema Brother    Past  Surgical History:  Procedure Laterality Date  . DILATATION & CURETTAGE/HYSTEROSCOPY WITH MYOSURE    . euflexa into knees    . eye injections    . PERMANENT PACEMAKER INSERTION N/A 09/14/2014   MDT MRI compatible dual chamber pacemaker implanted by [name]  . TONSILLECTOMY     1928   Social History   Social History  . Marital status: Widowed    Spouse name: N/A  . Number of children: N/A  . Years of education: N/A   Occupational History  . Not on file.   Social History Main Topics  . Smoking status: Former Games developermoker  . Smokeless tobacco: Never Used  . Alcohol use No  . Drug use: No  . Sexual activity: No   Other Topics Concern  . Not on file   Social History Narrative  . No narrative on  file     Review of Systems: General: negative for chills, fever, night sweats or weight changes.  Cardiovascular: negative for chest pain, dyspnea on exertion, edema, orthopnea, palpitations, paroxysmal nocturnal dyspnea or shortness of breath Dermatological: negative for rash Respiratory: negative for cough or wheezing Urologic: negative for hematuria Abdominal: negative for nausea, vomiting, diarrhea, bright red blood per rectum, melena, or hematemesis Neurologic: negative for visual changes, syncope, or dizziness All other systems reviewed and are otherwise negative except as noted above.   Physical Exam:  Blood pressure 138/78, pulse (!) 53, height 5\' 2"  (1.575 m), weight 152 lb (68.9 kg), SpO2 97 %.  General appearance: alert, cooperative and no distress Neck: + elevated JVD Lungs: clear to auscultation bilaterally Heart: regular rate and rhythm and AS murmur present Extremities: 3+ bilateral LE to the level of her thighs  Pulses: 2+ and symmetric Skin: Skin color, texture, turgor normal. No rashes or lesions Neurologic: Grossly normal  EKG not performed.   ASSESSMENT AND PLAN:   1. Acute on Chronic Systolic HF: Pt massively volume overloaded with 3+ bilateral pitting edema to the level of her thighs bilaterally with exertional dyspnea. She has failed outpatient diuretics. [facility]. Discussed with Dr. Katrinka BlazingSmith, DOD, who has also seen and evaluated the patient. Will will order IV Lasix q8H x 3 doses, then reassess response in the am and order additional lasix if needed. Will need to use caution with BP to prevent hypotension given severe AS. Will set parameters to hold Lasix if SBP is < 100 mmHg. Low sodium diet, strict I/Os and daily weights. Will follow renal function and K closely.   2. Severe Stage D Aortic Stenosis: Pt has been seen by Dr. Excell Seltzerooper. TAVR recommended however patient is refusing invasive treatment due to age of 82. She denies angina. No  syncope/ near syncope. Monitor BP closely in the hospital to avoid hypotension.     Robbie LisBrittainy Simmons PA-C 07/23/2016 4:24 PM

## 2016-07-23 NOTE — H&P (Signed)
The patient has been seen in conjunction with Robbie LisBrittainy Simmons,. All aspects of care have been considered and discussed. The patient has been personally interviewed, examined, and all clinical data has been reviewed.   The patient has known critical aortic stenosis. A 82 years of age, she has declined TAVR. Recently she has developed progressive lower extremity swelling and fatigue that has not responded to diuretic therapy. She has not experienced syncope, angina, or palpitations. There is dyspnea and swelling is noted.  On exam she is pleasant. She is in no acute distress but has significant dyspnea with walking. Her neck veins are noted to be of angle of the jaw while lying at 45. Faint basilar crackles are heard. She is dyspneic when she tries to talk. 3+ edema is noted from the ankles to the mid thigh bilaterally. The cardiac exam reveals a muted crescendo decrescendo systolic murmur at the right upper sternal border and left midsternal border. Carotid upstroke is somewhat diminished bilaterally.  Aortic stenosis with volume overload, clinical evidence of pulmonary hypertension, and prior history of complete heart block requiring permanent pacemaker.  Will admit to the hospital for IV diuresis. We will initially order only 3 doses of furosemide, 40 mg every 8 hours. We will specifically hold furosemide of the systolic pressure is below 100 mmHg. Additional furosemide will need to be ordered based upon her clinical course.  Overall prognosis is relatively poor given her age and aortic valve disease. Suspect there has been some further decline in LV systolic function.

## 2016-07-24 ENCOUNTER — Encounter (HOSPITAL_COMMUNITY): Payer: Self-pay | Admitting: General Practice

## 2016-07-24 LAB — BASIC METABOLIC PANEL
ANION GAP: 8 (ref 5–15)
BUN: 36 mg/dL — AB (ref 6–20)
CHLORIDE: 108 mmol/L (ref 101–111)
CO2: 24 mmol/L (ref 22–32)
Calcium: 8.9 mg/dL (ref 8.9–10.3)
Creatinine, Ser: 1.52 mg/dL — ABNORMAL HIGH (ref 0.44–1.00)
GFR, EST AFRICAN AMERICAN: 32 mL/min — AB (ref >60)
GFR, EST NON AFRICAN AMERICAN: 28 mL/min — AB (ref >60)
Glucose, Bld: 86 mg/dL (ref 65–99)
POTASSIUM: 3.1 mmol/L — AB (ref 3.5–5.1)
SODIUM: 140 mmol/L (ref 135–145)

## 2016-07-24 MED ORDER — LOSARTAN POTASSIUM 50 MG PO TABS
50.0000 mg | ORAL_TABLET | Freq: Every day | ORAL | Status: DC
  Administered 2016-07-25 – 2016-07-30 (×6): 50 mg via ORAL
  Filled 2016-07-24 (×6): qty 1

## 2016-07-24 MED ORDER — PROSIGHT PO TABS
1.0000 | ORAL_TABLET | Freq: Every day | ORAL | Status: DC
  Administered 2016-07-24 – 2016-07-30 (×7): 1 via ORAL
  Filled 2016-07-24 (×7): qty 1

## 2016-07-24 MED ORDER — OCUVITE-LUTEIN PO CAPS
1.0000 | ORAL_CAPSULE | Freq: Every day | ORAL | Status: DC
  Filled 2016-07-24: qty 1

## 2016-07-24 MED ORDER — FUROSEMIDE 10 MG/ML IJ SOLN
40.0000 mg | Freq: Two times a day (BID) | INTRAMUSCULAR | Status: DC
  Administered 2016-07-24 – 2016-07-29 (×10): 40 mg via INTRAVENOUS
  Filled 2016-07-24 (×10): qty 4

## 2016-07-24 MED ORDER — LUTEIN 20 MG PO TABS
20.0000 mg | ORAL_TABLET | Freq: Every day | ORAL | Status: DC
Start: 2016-07-24 — End: 2016-07-24

## 2016-07-24 MED ORDER — POTASSIUM CHLORIDE CRYS ER 20 MEQ PO TBCR
40.0000 meq | EXTENDED_RELEASE_TABLET | Freq: Every day | ORAL | Status: DC
  Administered 2016-07-24 – 2016-07-30 (×7): 40 meq via ORAL
  Filled 2016-07-24 (×7): qty 2

## 2016-07-24 MED ORDER — PRESERVISION AREDS 2 PO CAPS: ORAL_CAPSULE | Freq: Two times a day (BID) | ORAL | Status: DC

## 2016-07-24 MED ORDER — LEVOTHYROXINE SODIUM 75 MCG PO TABS
150.0000 ug | ORAL_TABLET | ORAL | Status: DC
  Administered 2016-07-25 – 2016-07-30 (×4): 150 ug via ORAL
  Filled 2016-07-24 (×4): qty 2

## 2016-07-24 MED ORDER — POTASSIUM CHLORIDE CRYS ER 20 MEQ PO TBCR
40.0000 meq | EXTENDED_RELEASE_TABLET | Freq: Once | ORAL | Status: AC
  Administered 2016-07-24: 40 meq via ORAL
  Filled 2016-07-24: qty 2

## 2016-07-24 NOTE — Progress Notes (Signed)
Patient Name: Kara Cruz Date of Encounter: 07/24/2016  Primary Cardiologist: Dr. Excell Seltzerooper  Electrophysiologist: Dr. Beverly SessionsKlein    Hospital Problem List     Principal Problem:   Acute on chronic systolic HF (heart failure) [facility](HCC) Active Problems:   Hypothyroidism   Essential hypertension   Complete heart block (HCC)   Aortic stenosis, severe     Subjective   Already feeling a lot better and swelling down to below the knees. Dyspnea improved. Wants to know if she can get home before christmas  Inpatient Medications    Scheduled Meds: . allopurinol  100 mg Oral Daily  . furosemide  40 mg Intravenous Q8H  . heparin  5,000 Units Subcutaneous Q8H  . [START ON 07/25/2016] levothyroxine  150 mcg Oral Once per day on Mon Tue Wed Thu Fri  . losartan  100 mg Oral Daily  . potassium chloride  40 mEq Oral Once  . potassium chloride  40 mEq Oral Daily  . sodium chloride flush  3 mL Intravenous Q12H   Continuous Infusions:  PRN Meds: sodium chloride, acetaminophen, meclizine, ondansetron (ZOFRAN) IV, sodium chloride flush   Vital Signs    Vitals:   07/23/16 1715 07/23/16 1959 07/24/16 0407 07/24/16 0916  BP: 118/83 (!) 117/49 (!) 125/54 (!) 141/72  Pulse: 83 78 61 66  Resp: (!) 22 20 20 20   Temp: 98.1 F (36.7 C) 97.9 F (36.6 C) 97.8 F (36.6 C) 97.4 F (36.3 C)  TempSrc: Oral Oral Oral Oral  SpO2: 96% 96% 94% 100%  Weight: 150 lb 11.2 oz (68.4 kg)  149 lb (67.6 kg)   Height: 5' 2.5" (1.588 m)       Intake/Output Summary (Last 24 hours) at 07/24/16 0949 Last data filed at 07/24/16 0900  Gross per 24 hour  Intake              240 ml  Output             1450 ml  Net            -1210 ml   Filed Weights   07/23/16 1715 07/24/16 0407  Weight: 150 lb 11.2 oz (68.4 kg) 149 lb (67.6 kg)    Physical Exam   GEN: Well nourished, well developed, in no acute distress. Appears younger than stated age. HEENT: Grossly normal.  Neck: Supple, +JVD, carotid bruits, or  masses. Cardiac: RRR, + SEM, rubs, or gallops. No clubbing, cyanosis, 3+ LE edema to just below the knees bilaterally  Radials/DP/PT 2+ and equal bilaterally.  Respiratory:  Respirations regular and unlabored, crackles at bases GI: Soft, nontender, nondistended, BS + x 4. MS: no deformity or atrophy. Skin: warm and dry, no rash. Neuro:  Strength and sensation are intact. Psych: AAOx3.  Normal affect.  Labs    CBC  Recent Labs  07/23/16 1851  WBC 5.2  HGB 12.0  HCT 37.5  MCV 83.1  PLT 197   Basic Metabolic Panel  Recent Labs  07/23/16 1851 07/24/16 0439  NA 139 140  K 3.2* 3.1*  CL 106 108  CO2 23 24  GLUCOSE 104* 86  BUN 37* 36*  CREATININE 1.37* 1.52*  CALCIUM 9.3 8.9   Liver Function Tests No results for input(s): AST, ALT, ALKPHOS, BILITOT, PROT, ALBUMIN in the last 72 hours. No results for input(s): LIPASE, AMYLASE in the last 72 hours. Cardiac Enzymes No results for input(s): CKTOTAL, CKMB, CKMBINDEX, TROPONINI in the last 72 hours. BNP Invalid input(s):  POCBNP D-Dimer No results for input(s): DDIMER in the last 72 hours. Hemoglobin A1C No results for input(s): HGBA1C in the last 72 hours. Fasting Lipid Panel No results for input(s): CHOL, HDL, LDLCALC, TRIG, CHOLHDL, LDLDIRECT in the last 72 hours. Thyroid Function Tests  Recent Labs  07/23/16 1851  TSH 13.438*    Telemetry    AV paced with PVCs, IVR and SVT - Personally Reviewed   Radiology    No results found.  Cardiac Studies   2D ECHO: 04/03/2016 LV EF: 40% -   45% Study Conclusions - Left ventricle: The cavity size was normal. There was moderate   focal basal hypertrophy of the septum. Systolic function was   mildly to moderately reduced. The estimated ejection fraction was   in the range of 40% to 45%. Diffuse hypokinesis. - Ventricular septum: Septal motion showed abnormal function and   dyssynergy. - Aortic valve: Valve mobility was restricted. There was moderate   to severe  stenosis. There was moderate regurgitation. Peak   velocity (S): 397 cm/s. Mean gradient (S): 31 mm Hg. - Mitral valve: There was moderate regurgitation. - Left atrium: The atrium was severely dilated. - Tricuspid valve: There was mild-moderate regurgitation. - Pulmonic valve: There was moderate regurgitation. - Pulmonary arteries: PA peak pressure: 63 mm Hg (S). Impressions: - LVEF may be underestimated due to frequent ectopy and LBBB.   Unable to view images from 11/2015, but by report systolic   function has worsened since that time.   Patient Profile    Kara Codernne S Laflamme is a 82 y.o. female with a history of HTN, hypothyroidism, gout, CHB s/p Medtronic PPM, PAF (declined NOAC in past) chronic systolic CHF (EF 16-10%40-45%) and severe AS (declined TAVR) who was admitted from the office yesterday for acute systolic CHF with massive volume overload.  Assessment & Plan    Acute on Chronic Systolic HF: Pt massively volume overloaded with 3+ bilateral pitting edema to the level of her thighs bilaterally with exertional dyspnea. She has failed outpatient diuretics and admitted for IV diuretics. Started on IV Lasix q8H x 3 doses. Will need to use caution with BP to prevent hypotension given severe AS. Hold parameters to hold Lasix if SBP is < 100 mmHg.  -- She has already had a big improvement. Neg 1300 overnight and weigh down 1 lbs. Still due for 2 more doses of IV lasix 40mg . Will continue this and reassess tomorrow AM. -- Low sodium diet, strict I/Os and daily weights.   Severe Stage D Aortic Stenosis: Pt has been seen by Dr. Excell Seltzerooper. TAVR recommended however patient is refusing invasive treatment due to age of 82. She denies angina. No syncope/ near syncope. Monitor BP closely in the hospital to avoid hypotension.   CHB s/p PPM: Followed closely by Dr. Graciela HusbandsKlein and Gypsy BalsamAmber Seiler NP. Normal PPM function  HTN: BP with moderate control  Hypokalemia: will supplement  Hypothyroidism: TSH 13.4. Will  increase Synthroid from 100--> 150mcg. This will need to be followed by PCP  CKD: creat 1.37--> 1.52. Follow with diuresis  Dispo: per Dr. Katrinka BlazingSmith, overall prognosis is relatively poor given her age and aortic valve disease. Suspect there has been some further decline in LV systolic function. We may want to involve palliative care for a discussion on goals of care.   Signed, Cline CrockKathryn Thompson, PA-C  07/24/2016, 9:49 AM  As above, patient seen and examined. She denies dyspnea or chest pain but remains volume overloaded. Continue Lasix 40 mg IV  twice a day and follow renal function. Otherwise she will be treated medically at her request. She does not want to consider TAVR which is appropriate. Change cozaar to 50 mg daily. Olga Millers, MD

## 2016-07-24 NOTE — Progress Notes (Addendum)
Pt ovided, but urine failed to go into commode hat; therefore unable to measure.  Have now placed 2nd hat in commode to catch output.

## 2016-07-25 DIAGNOSIS — I5033 Acute on chronic diastolic (congestive) heart failure: Secondary | ICD-10-CM

## 2016-07-25 LAB — BASIC METABOLIC PANEL
Anion gap: 6 (ref 5–15)
BUN: 34 mg/dL — ABNORMAL HIGH (ref 6–20)
CO2: 29 mmol/L (ref 22–32)
Calcium: 9 mg/dL (ref 8.9–10.3)
Chloride: 106 mmol/L (ref 101–111)
Creatinine, Ser: 1.4 mg/dL — ABNORMAL HIGH (ref 0.44–1.00)
GFR calc Af Amer: 36 mL/min — ABNORMAL LOW (ref >60)
GFR calc non Af Amer: 31 mL/min — ABNORMAL LOW (ref >60)
Glucose, Bld: 76 mg/dL (ref 65–99)
Potassium: 3.6 mmol/L (ref 3.5–5.1)
Sodium: 141 mmol/L (ref 135–145)

## 2016-07-25 NOTE — Progress Notes (Addendum)
Patient Name: Kara Cruz Date of Encounter: 07/25/2016  Primary Cardiologist: Dr. Excell Seltzerooper  Electrophysiologist: Dr. Beverly SessionsKlein    Hospital Problem List     Principal Problem:   Acute on chronic systolic HF (heart failure) [facility](HCC) Active Problems:   Hypothyroidism   Essential hypertension   Complete heart block (HCC)   Aortic stenosis, severe     Subjective   Denies dyspnea or chest pain; edema improving  Inpatient Medications    Scheduled Meds: . allopurinol  100 mg Oral Daily  . furosemide  40 mg Intravenous BID  . heparin  5,000 Units Subcutaneous Q8H  . levothyroxine  150 mcg Oral Once per day on Mon Tue Wed Thu Fri  . losartan  50 mg Oral Daily  . multivitamin  1 tablet Oral Daily  . potassium chloride  40 mEq Oral Daily  . sodium chloride flush  3 mL Intravenous Q12H   Continuous Infusions:  PRN Meds: sodium chloride, acetaminophen, meclizine, ondansetron (ZOFRAN) IV, sodium chloride flush   Vital Signs    Vitals:   07/24/16 0916 07/24/16 1200 07/24/16 2031 07/25/16 0420  BP: (!) 141/72 (!) 123/53 (!) 137/59 124/80  Pulse: 66 (!) 59 77 69  Resp: 20 20 20 20   Temp: 97.4 F (36.3 C) 98 F (36.7 C) 98.2 F (36.8 C) 97.5 F (36.4 C)  TempSrc: Oral Oral Oral Oral  SpO2: 100% 98% 96% 96%  Weight:    143 lb 8 oz (65.1 kg)  Height:        Intake/Output Summary (Last 24 hours) at 07/25/16 1103 Last data filed at 07/25/16 0726  Gross per 24 hour  Intake              960 ml  Output             2150 ml  Net            -1190 ml   Filed Weights   07/23/16 1715 07/24/16 0407 07/25/16 0420  Weight: 150 lb 11.2 oz (68.4 kg) 149 lb (67.6 kg) 143 lb 8 oz (65.1 kg)    Physical Exam   GEN: Well nourished, frail in no acute distress. HEENT: Grossly normal.  Neck: Supple Cardiac: RRR, 3/6 systolic murmur, 2 + pedal edema Respiratory:  CTA GI: Soft, nontender, nondistended. MS: no deformity or atrophy. Skin: warm and dry, no rash. Neuro:  Strength and  sensation are intact. Psych: AAOx3.  Normal affect.  Labs    CBC  Recent Labs  07/23/16 1851  WBC 5.2  HGB 12.0  HCT 37.5  MCV 83.1  PLT 197   Basic Metabolic Panel  Recent Labs  07/24/16 0439 07/25/16 0430  NA 140 141  K 3.1* 3.6  CL 108 106  CO2 24 29  GLUCOSE 86 76  BUN 36* 34*  CREATININE 1.52* 1.40*  CALCIUM 8.9 9.0   Thyroid Function Tests  Recent Labs  07/23/16 1851  TSH 13.438*    Telemetry    AV paced with PVCs - Personally Reviewed     Cardiac Studies   2D ECHO: 04/03/2016 LV EF: 40% -   45% Study Conclusions - Left ventricle: The cavity size was normal. There was moderate   focal basal hypertrophy of the septum. Systolic function was   mildly to moderately reduced. The estimated ejection fraction was   in the range of 40% to 45%. Diffuse hypokinesis. - Ventricular septum: Septal motion showed abnormal function and   dyssynergy. - Aortic  valve: Valve mobility was restricted. There was moderate   to severe stenosis. There was moderate regurgitation. Peak   velocity (S): 397 cm/s. Mean gradient (S): 31 mm Hg. - Mitral valve: There was moderate regurgitation. - Left atrium: The atrium was severely dilated. - Tricuspid valve: There was mild-moderate regurgitation. - Pulmonic valve: There was moderate regurgitation. - Pulmonary arteries: PA peak pressure: 63 mm Hg (S). Impressions: - LVEF may be underestimated due to frequent ectopy and LBBB.   Unable to view images from 11/2015, but by report systolic   function has worsened since that time.   Patient Profile    Kara Cruz is a 82 y.o. female with a history of HTN, hypothyroidism, gout, CHB s/p Medtronic PPM, PAF (declined NOAC in past) chronic systolic CHF (EF 14-78%40-45%) and severe AS (declined TAVR) who was admitted from the office yesterday for acute systolic CHF with massive volume overload.  Assessment & Plan    Acute on Chronic diastolic CHF: Pt improving;continue lasix at  present dose and follow renal function; I/O -1935  Severe Stage D Aortic Stenosis: Pt has been seen by Dr. Excell Seltzerooper. TAVR recommended however patient declined invasive treatment due to age of 82.  CHB s/p PPM: Followed closely by Dr. Graciela HusbandsKlein and Gypsy BalsamAmber Seiler NP. Normal PPM function  HTN: BP controlled; continue present meds  Hypothyroidism: TSH 13.4. Will increase Synthroid from 100--> 150mcg. This will need to be followed by PCP  CKD: creat 1.37--> 1.52. Follow with diuresis  Signed, Olga MillersBrian Charnese Federici, MD  07/25/2016, 11:03 AM

## 2016-07-25 NOTE — Care Management Note (Signed)
Case Management Note  Patient Details  Name: Kara Cruz MRN: [id number] Date of Birth: 03/28/1918  Subjective/Objective:      Admitted with CHF              Action/Plan: Patient lives at home with her daughter; PCP is Dr Ivery Qualeaniel Patterson; has private insurance with Medicare / [employer] with prescription drug coverage; pharmacy of choice is [facility]; patient reports no problem getting her medication; DME - cane; she has scales at home and knows to weigh herself daily; her daughter does the majority of the cooking - low sodium; [facility], patient stated " absolutely not." CM will continue to follow for DCP  Expected Discharge Date:    possibly 07/27/2016             Expected Discharge Plan:  Home/Self Care   Discharge planning Services  CM Consult  Status of Service:  In process, will continue to follow  Reola MosherChandler, Lucion Dilger L, RN,MHA,BSN [phone] 07/25/2016, 9:50 AM

## 2016-07-26 LAB — BASIC METABOLIC PANEL
Anion gap: 9 (ref 5–15)
BUN: 33 mg/dL — ABNORMAL HIGH (ref 6–20)
CHLORIDE: 106 mmol/L (ref 101–111)
CO2: 27 mmol/L (ref 22–32)
CREATININE: 1.45 mg/dL — AB (ref 0.44–1.00)
Calcium: 9.3 mg/dL (ref 8.9–10.3)
GFR calc non Af Amer: 29 mL/min — ABNORMAL LOW (ref >60)
GFR, EST AFRICAN AMERICAN: 34 mL/min — AB (ref >60)
Glucose, Bld: 86 mg/dL (ref 65–99)
Potassium: 3.7 mmol/L (ref 3.5–5.1)
Sodium: 142 mmol/L (ref 135–145)

## 2016-07-26 NOTE — Care Management Important Message (Signed)
Important Message  Patient Details  Name: Kara Cruz MRN: [id number] Date of Birth: 06/09/1918   Medicare Important Message Given:  Yes    [name] Stefan ChurchBratton 07/26/2016, 10:28 AM

## 2016-07-26 NOTE — Progress Notes (Addendum)
Patient Name: Kara Codernne S Lady Date of Encounter: 07/26/2016  Primary Cardiologist: Dr. Excell Seltzerooper  Electrophysiologist: Dr. Beverly SessionsKlein    Hospital Problem List     Principal Problem:   Acute on chronic systolic HF (heart failure) [facility](HCC) Active Problems:   Hypothyroidism   Essential hypertension   Complete heart block (HCC)   Aortic stenosis, severe     Subjective   Denies dyspnea or chest pain; edema improving  Inpatient Medications    Scheduled Meds: . allopurinol  100 mg Oral Daily  . furosemide  40 mg Intravenous BID  . heparin  5,000 Units Subcutaneous Q8H  . levothyroxine  150 mcg Oral Once per day on Mon Tue Wed Thu Fri  . losartan  50 mg Oral Daily  . multivitamin  1 tablet Oral Daily  . potassium chloride  40 mEq Oral Daily  . sodium chloride flush  3 mL Intravenous Q12H   Continuous Infusions:  PRN Meds: sodium chloride, acetaminophen, meclizine, ondansetron (ZOFRAN) IV, sodium chloride flush   Vital Signs    Vitals:   07/25/16 0420 07/25/16 1118 07/25/16 1918 07/26/16 0358  BP: 124/80 133/77 (!) 146/88 (!) 145/67  Pulse: 69 66 67 71  Resp: 20 16 18 18   Temp: 97.5 F (36.4 C) 97.7 F (36.5 C) 97.4 F (36.3 C) 97.4 F (36.3 C)  TempSrc: Oral Oral Oral Oral  SpO2: 96% 100% 99% 98%  Weight: 143 lb 8 oz (65.1 kg)   140 lb 6.4 oz (63.7 kg)  Height:        Intake/Output Summary (Last 24 hours) at 07/26/16 1001 Last data filed at 07/26/16 0800  Gross per 24 hour  Intake              520 ml  Output             3625 ml  Net            -3105 ml   Filed Weights   07/24/16 0407 07/25/16 0420 07/26/16 0358  Weight: 149 lb (67.6 kg) 143 lb 8 oz (65.1 kg) 140 lb 6.4 oz (63.7 kg)    Physical Exam   GEN: Well nourished, frail in no acute distress. HEENT: Grossly normal.  Neck: Supple Cardiac: RRR, 3/6 systolic murmur, 1 + pedal edema Respiratory:  CTA GI: Soft, nontender, nondistended. MS: no deformity or atrophy. Skin: warm and dry, no rash. Neuro:   Strength and sensation are intact. Psych: AAOx3.  Normal affect.  Labs    CBC  Recent Labs  07/23/16 1851  WBC 5.2  HGB 12.0  HCT 37.5  MCV 83.1  PLT 197   Basic Metabolic Panel  Recent Labs  07/25/16 0430 07/26/16 0426  NA 141 142  K 3.6 3.7  CL 106 106  CO2 29 27  GLUCOSE 76 86  BUN 34* 33*  CREATININE 1.40* 1.45*  CALCIUM 9.0 9.3   Thyroid Function Tests  Recent Labs  07/23/16 1851  TSH 13.438*    Telemetry    AV paced with PVCs - Personally Reviewed     Cardiac Studies   2D ECHO: 04/03/2016 LV EF: 40% -   45% Study Conclusions - Left ventricle: The cavity size was normal. There was moderate   focal basal hypertrophy of the septum. Systolic function was   mildly to moderately reduced. The estimated ejection fraction was   in the range of 40% to 45%. Diffuse hypokinesis. - Ventricular septum: Septal motion showed abnormal function and  dyssynergy. - Aortic valve: Valve mobility was restricted. There was moderate   to severe stenosis. There was moderate regurgitation. Peak   velocity (S): 397 cm/s. Mean gradient (S): 31 mm Hg. - Mitral valve: There was moderate regurgitation. - Left atrium: The atrium was severely dilated. - Tricuspid valve: There was mild-moderate regurgitation. - Pulmonic valve: There was moderate regurgitation. - Pulmonary arteries: PA peak pressure: 63 mm Hg (S). Impressions: - LVEF may be underestimated due to frequent ectopy and LBBB.   Unable to view images from 11/2015, but by report systolic   function has worsened since that time.   Patient Profile    Kara Cruz is a 82 y.o. female with a history of HTN, hypothyroidism, gout, CHB s/p Medtronic PPM, PAF (declined NOAC in past) chronic systolic CHF (EF 16-10%40-45%) and severe AS (declined TAVR) who was admitted from the office  for acute systolic CHF.  Assessment & Plan    Acute on Chronic diastolic CHF: Pt continues to improve;continue lasix at present dose and  follow renal function; I/O -2465; weight 140 (admission weight 152). Possible DC in AM on lasix 40 mg po BID with close fu of renal function as outpt.  Severe Stage D Aortic Stenosis: Pt has been seen by Dr. Excell Seltzerooper. TAVR recommended however patient declined invasive treatment due to age of 82.  CHB s/p PPM: Followed closely by Dr. Graciela HusbandsKlein. Normal PPM function  HTN: BP controlled; continue present meds  Hypothyroidism: TSH 13.4. Synthroid increased from 100--> 150mcg. This will need to be followed by PCP  Chronic stage 3 CKD: creat 1.37--> 1.52. Follow with diuresis  Signed, Olga MillersBrian Tiandra Swoveland, MD  07/26/2016, 10:01 AM

## 2016-07-26 NOTE — Consult Note (Signed)
   [facility] Inpatient Consult   07/26/2016  Kara Cruz Jul 05, 1918 [id number]  Patient was screened for [facility] Management program as a beneficiary of her Medicare [place].  Patient was admitted with HF exacerbation.   Patient lives with her daughter and denies problems with medications or transportation.  Patient is declining home health services. Patient has been assigned [facility] EMMI HF follow up calls by the inpatient RNCM.  Met with the patient at the bedside.  Patient endorses Dr. Leanna Battles as her primary care provider. She states she will accept the follow up telephone calls but, declines home visits,  and gave the patient a brochure with [place] Management's contact information.  For questions, please contact:    Natividad Brood, RN BSN [facility] Liaison  [phone] business mobile phone Toll free office [phone]

## 2016-07-27 DIAGNOSIS — I442 Atrioventricular block, complete: Secondary | ICD-10-CM

## 2016-07-27 DIAGNOSIS — I5043 Acute on chronic combined systolic (congestive) and diastolic (congestive) heart failure: Secondary | ICD-10-CM

## 2016-07-27 LAB — BASIC METABOLIC PANEL
ANION GAP: 9 (ref 5–15)
BUN: 38 mg/dL — ABNORMAL HIGH (ref 6–20)
CHLORIDE: 106 mmol/L (ref 101–111)
CO2: 24 mmol/L (ref 22–32)
Calcium: 9 mg/dL (ref 8.9–10.3)
Creatinine, Ser: 1.34 mg/dL — ABNORMAL HIGH (ref 0.44–1.00)
GFR calc non Af Amer: 32 mL/min — ABNORMAL LOW (ref >60)
GFR, EST AFRICAN AMERICAN: 37 mL/min — AB (ref >60)
Glucose, Bld: 93 mg/dL (ref 65–99)
POTASSIUM: 3.9 mmol/L (ref 3.5–5.1)
SODIUM: 139 mmol/L (ref 135–145)

## 2016-07-27 NOTE — Progress Notes (Signed)
Patient Name: Kara Cruz Date of Encounter: 07/27/2016  Primary Cardiologist: Dr. Tonny BollmanMichael Cooper EP: Dr. Sherryl MangesSteven Klein  Subjective   Feels better but still complaining of leg edema. No chest pain.  Inpatient Medications    Scheduled Meds: . allopurinol  100 mg Oral Daily  . furosemide  40 mg Intravenous BID  . heparin  5,000 Units Subcutaneous Q8H  . levothyroxine  150 mcg Oral Once per day on Mon Tue Wed Thu Fri  . losartan  50 mg Oral Daily  . multivitamin  1 tablet Oral Daily  . potassium chloride  40 mEq Oral Daily  . sodium chloride flush  3 mL Intravenous Q12H    PRN Meds: sodium chloride, acetaminophen, meclizine, ondansetron (ZOFRAN) IV, sodium chloride flush   Vital Signs    Vitals:   07/26/16 1000 07/26/16 1142 07/26/16 2049 07/27/16 0444  BP: 133/63 129/62 135/63 (!) 128/57  Pulse: 68 66 72 63  Resp:  18 (!) 22 14  Temp: 97.6 F (36.4 C) 97.6 F (36.4 C) 97.9 F (36.6 C) 97.9 F (36.6 C)  TempSrc: Oral Oral Oral Oral  SpO2: 98% 96% 95% 99%  Weight:    138 lb 9.6 oz (62.9 kg)  Height:        Intake/Output Summary (Last 24 hours) at 07/27/16 1135 Last data filed at 07/27/16 1037  Gross per 24 hour  Intake              683 ml  Output             3050 ml  Net            -2367 ml   Filed Weights   07/25/16 0420 07/26/16 0358 07/27/16 0444  Weight: 143 lb 8 oz (65.1 kg) 140 lb 6.4 oz (63.7 kg) 138 lb 9.6 oz (62.9 kg)    Physical Exam    Gen.: Elderly woman, no distress. Neck: Supple, no elevated JVP. Lungs: Clear to auscultation, nonlabored breathing at rest. Cardiac: Regular rate and rhythm, no S3, 2/6 systolic murmur. Abdomen: Soft, nontender, bowel sounds present. Extremities: 2+ leg edema and venous stasis, distal pulses 2+.  Labs    Basic Metabolic Panel  Recent Labs  07/26/16 0426 07/27/16 0252  NA 142 139  K 3.7 3.9  CL 106 106  CO2 27 24  GLUCOSE 86 93  BUN 33* 38*  CREATININE 1.45* 1.34*  CALCIUM 9.3 9.0     Telemetry    I personally reviewed telemetry monitoring which shows ventricular pacing and intermittent PVCs.  Cardiac Studies   Echocardiogram 04/03/2016: Study Conclusions  - Left ventricle: The cavity size was normal. There was moderate   focal basal hypertrophy of the septum. Systolic function was   mildly to moderately reduced. The estimated ejection fraction was   in the range of 40% to 45%. Diffuse hypokinesis. - Ventricular septum: Septal motion showed abnormal function and   dyssynergy. - Aortic valve: Valve mobility was restricted. There was moderate   to severe stenosis. There was moderate regurgitation. Peak   velocity (S): 397 cm/s. Mean gradient (S): 31 mm Hg. - Mitral valve: There was moderate regurgitation. - Left atrium: The atrium was severely dilated. - Tricuspid valve: There was mild-moderate regurgitation. - Pulmonic valve: There was moderate regurgitation. - Pulmonary arteries: PA peak pressure: 63 mm Hg (S).  Impressions:  - LVEF may be underestimated due to frequent ectopy and LBBB.   Unable to view images from 11/2015, but by  report systolic   function has worsened since that time.  Patient Profile     82 year old woman with history of hypertension, hypothyroidism, complete heart block status post Medtronic pacemaker, PAF, systolic heart failure, and severe aortic stenosis (declined TAVR). She is being managed for acute on chronic systolic heart failure.  Assessment & Plan    1. Acute on chronic combined heart failure, LVEF 40-45%. She continues to diurese on IV Lasix, another 1800 cc out last 24 hours. Feels better but still complaining of leg edema and not quite back to baseline. Creatinine down to 1.3.  2. Severe aortic stenosis, managing conservatively. Patient declined TAVR with prior workup.  3. Complete heart block status post Medtronic pacemaker, followed by Dr. Graciela HusbandsKlein.  4. CKD stage 3, creatinine down to 1.3.  Discussed with patient  today. We will diurese with IV Lasix another 24 hours and reassess tomorrow. Check BMET a.m.  Signed, Jonelle SidleSamuel G. Lavera Vandermeer, M.D., F.A.C.C.  07/27/2016, 11:35 AM

## 2016-07-28 LAB — BASIC METABOLIC PANEL
Anion gap: 9 (ref 5–15)
BUN: 43 mg/dL — ABNORMAL HIGH (ref 6–20)
CHLORIDE: 105 mmol/L (ref 101–111)
CO2: 25 mmol/L (ref 22–32)
CREATININE: 1.33 mg/dL — AB (ref 0.44–1.00)
Calcium: 9.2 mg/dL (ref 8.9–10.3)
GFR calc non Af Amer: 33 mL/min — ABNORMAL LOW (ref >60)
GFR, EST AFRICAN AMERICAN: 38 mL/min — AB (ref >60)
Glucose, Bld: 87 mg/dL (ref 65–99)
Potassium: 3.9 mmol/L (ref 3.5–5.1)
Sodium: 139 mmol/L (ref 135–145)

## 2016-07-28 NOTE — Progress Notes (Signed)
Pt lying in bed sleeping

## 2016-07-28 NOTE — Progress Notes (Signed)
Patient Name: Kara Cruz Date of Encounter: 07/28/2016  Primary Cardiologist: Dr. Tonny BollmanMichael Cooper  Subjective   No chest pain or SOB, but she is concerned about lower ext edema. Improving but still present.  Inpatient Medications    Scheduled Meds: . allopurinol  100 mg Oral Daily  . furosemide  40 mg Intravenous BID  . heparin  5,000 Units Subcutaneous Q8H  . levothyroxine  150 mcg Oral Once per day on Mon Tue Wed Thu Fri  . losartan  50 mg Oral Daily  . multivitamin  1 tablet Oral Daily  . potassium chloride  40 mEq Oral Daily  . sodium chloride flush  3 mL Intravenous Q12H    PRN Meds: sodium chloride, acetaminophen, meclizine, ondansetron (ZOFRAN) IV, sodium chloride flush   Vital Signs    Vitals:   07/27/16 0444 07/27/16 1300 07/27/16 2015 07/28/16 0611  BP: (!) 128/57 119/61 104/82 (!) 115/59  Pulse: 63 69 67 65  Resp: 14 18 16 18   Temp: 97.9 F (36.6 C) 98.1 F (36.7 C) 98 F (36.7 C) 97.8 F (36.6 C)  TempSrc: Oral Oral Oral Oral  SpO2: 99% 95% 98% 97%  Weight: 138 lb 9.6 oz (62.9 kg)   132 lb 8 oz (60.1 kg)  Height:        Intake/Output Summary (Last 24 hours) at 07/28/16 0955 Last data filed at 07/28/16 0555  Gross per 24 hour  Intake              243 ml  Output             4200 ml  Net            -3957 ml   Filed Weights   07/26/16 0358 07/27/16 0444 07/28/16 0611  Weight: 140 lb 6.4 oz (63.7 kg) 138 lb 9.6 oz (62.9 kg) 132 lb 8 oz (60.1 kg)    Physical Exam   Gen.: Elderly woman, no distress. Neck: Supple, no elevated JVP. Lungs: Clear to auscultation, nonlabored breathing at rest. Cardiac: Regular rate and rhythm, no S3, 2/6 systolic murmur. Abdomen: Soft, nontender, bowel sounds present. Extremities: 2+ leg edema and venous stasis, distal pulses 2+.   Labs    Basic Metabolic Panel  Recent Labs  07/27/16 0252 07/28/16 0332  NA 139 139  K 3.9 3.9  CL 106 105  CO2 24 25  GLUCOSE 93 87  BUN 38* 43*  CREATININE 1.34* 1.33*   CALCIUM 9.0 9.2    Telemetry    A V pacing and sensing, PVCs.  SR  - Personally Reviewed  Cardiac Studies   Echo from 04/03/16 Study Conclusions  - Left ventricle: The cavity size was normal. There was moderate   focal basal hypertrophy of the septum. Systolic function was   mildly to moderately reduced. The estimated ejection fraction was   in the range of 40% to 45%. Diffuse hypokinesis. - Ventricular septum: Septal motion showed abnormal function and   dyssynergy. - Aortic valve: Valve mobility was restricted. There was moderate   to severe stenosis. There was moderate regurgitation. Peak   velocity (S): 397 cm/s. Mean gradient (S): 31 mm Hg. - Mitral valve: There was moderate regurgitation. - Left atrium: The atrium was severely dilated. - Tricuspid valve: There was mild-moderate regurgitation. - Pulmonic valve: There was moderate regurgitation. - Pulmonary arteries: PA peak pressure: 63 mm Hg (S).  Impressions:  - LVEF may be underestimated due to frequent ectopy and LBBB.  Unable to view images from 11/2015, but by report systolic   function has worsened since that time.   Patient Profile     82 year old woman with history of hypertension, hypothyroidism, complete heart block status post Medtronic pacemaker, PAF, systolic heart failure, and severe aortic stenosis (declined TAVR). She is being managed for acute on chronic systolic heart failure.  Assessment & Plan    1. Acute on chronic combined heart failure, LVEF 40-45%. She continues to diurese on IV Lasix, another neg 4017 in 24 hours.  Feels better but still complaining of leg edema and not quite back to baseline. Creatinine down to 1.3 and stable.  Since admit neg 11, 574 and wt is down from 150 to 132.8. Another day of IV and ? home tomorrow on higher dose of po lasix she was on 40 daily.   2. Severe aortic stenosis, managing conservatively. Patient declined TAVR with prior workup.  3. Complete heart  block status post Medtronic pacemaker, followed by Dr. Graciela HusbandsKlein. Now in SR with AV pacing and sensing.    4. CKD stage 3, creatinine down to 1.3.  Signed, Nada BoozerLaura Ingold, NP  07/28/2016, 9:55 AM  [facility] Pager [phone]  After 5 or weekends [phone]   Attending note:  Patient seen and examined. Agree with above assessment by Ms. Ingold NP. Patient continues to diurese quite well on IV Lasix with significant reduction in weight, still has leg edema and not at baseline yet. Renal function tolerating diuresis with creatinine down to 1.3. Plan will be continued IV diuresis another 24 hours, possibly transition to oral Lasix tomorrow with discharge. Follow-up BMET in a.m.  Jonelle SidleSamuel G. McDowell, M.D., F.A.C.C.

## 2016-07-29 LAB — BASIC METABOLIC PANEL
ANION GAP: 7 (ref 5–15)
BUN: 45 mg/dL — AB (ref 6–20)
CALCIUM: 9.2 mg/dL (ref 8.9–10.3)
CO2: 30 mmol/L (ref 22–32)
Chloride: 102 mmol/L (ref 101–111)
Creatinine, Ser: 1.62 mg/dL — ABNORMAL HIGH (ref 0.44–1.00)
GFR calc Af Amer: 30 mL/min — ABNORMAL LOW (ref >60)
GFR, EST NON AFRICAN AMERICAN: 26 mL/min — AB (ref >60)
GLUCOSE: 88 mg/dL (ref 65–99)
POTASSIUM: 4 mmol/L (ref 3.5–5.1)
SODIUM: 139 mmol/L (ref 135–145)

## 2016-07-29 MED ORDER — TORSEMIDE 20 MG PO TABS
20.0000 mg | ORAL_TABLET | Freq: Every day | ORAL | Status: DC
  Administered 2016-07-30: 20 mg via ORAL
  Filled 2016-07-29: qty 1

## 2016-07-29 NOTE — Progress Notes (Signed)
    Subjective:  Feels a little better. Still short of breath with activity. Comfortable at rest. No orthopnea.   Objective:  Vital Signs in the last 24 hours: Temp:  [97.6 F (36.4 C)-97.9 F (36.6 C)] 97.6 F (36.4 C) (12/25 0558) Pulse Rate:  [61-70] 61 (12/25 0558) Resp:  [16-20] 16 (12/25 0558) BP: (113-129)/(43-54) 113/54 (12/25 0558) SpO2:  [95 %-98 %] 95 % (12/25 0558) Weight:  [129 lb 9.6 oz (58.8 kg)] 129 lb 9.6 oz (58.8 kg) (12/25 0558)  Intake/Output from previous day: 12/24 0701 - 12/25 0700 In: 720 [P.O.:720] Out: 3300 [Urine:3300]  Physical Exam: Pt is alert and oriented, pleasant elderly woman in NAD HEENT: normal Neck: JVP - mildly elevated Lungs: CTA bilaterally CV: irregular with 2/6 harsh systolic murmur at the LLSB Abd: soft, NT, Positive BS, no hepatomegaly Ext: diffuse leg edema Skin: warm/dry no rash  Lab Results: No results for input(s): WBC, HGB, PLT in the last 72 hours.  Recent Labs  07/28/16 0332 07/29/16 0344  NA 139 139  K 3.9 4.0  CL 105 102  CO2 25 30  GLUCOSE 87 88  BUN 43* 45*  CREATININE 1.33* 1.62*   No results for input(s): TROPONINI in the last 72 hours.  Invalid input(s): CK, MB  Cardiac Studies: 2D Echo 04-03-2016: Study Conclusions  - Left ventricle: The cavity size was normal. There was moderate   focal basal hypertrophy of the septum. Systolic function was   mildly to moderately reduced. The estimated ejection fraction was   in the range of 40% to 45%. Diffuse hypokinesis. - Ventricular septum: Septal motion showed abnormal function and   dyssynergy. - Aortic valve: Valve mobility was restricted. There was moderate   to severe stenosis. There was moderate regurgitation. Peak   velocity (S): 397 cm/s. Mean gradient (S): 31 mm Hg. - Mitral valve: There was moderate regurgitation. - Left atrium: The atrium was severely dilated. - Tricuspid valve: There was mild-moderate regurgitation. - Pulmonic valve: There  was moderate regurgitation. - Pulmonary arteries: PA peak pressure: 63 mm Hg (S).  Impressions:  - LVEF may be underestimated due to frequent ectopy and LBBB.   Unable to view images from 11/2015, but by report systolic   function has worsened since that time.  Assessment/Plan:  1. Acute on chronic systolic heart failure, NYHA IIIb: improved with no resting symptoms this morning.   Pt has had excellent diuresis with weight down to 129# from admission weight 150#. I/O recorded -13L since admission. Creatinine slowly increasing. Wouldn't push IV diuretics any further. Recommend change oral diuretic to torsemide 20 mg with better bioavailability than furosemide. Can take extra torsemide 20 mg with 3# weight gain from baseline. Consider DC tomorrow if clinically stable. She understands symptoms will likely progress in context of severe aortic stenosis ad worsening LV function.   2. Severe AS: conservative management per patient's wishes  3. CHB s/p PPM  4. CKD 3: with AKI in setting diuresis/heart failure. Switch to oral diuretics today.  Tonny Bollmanooper, Cristie Mckinney, M.D. 07/29/2016, 9:06 AM

## 2016-07-30 ENCOUNTER — Telehealth: Payer: Self-pay | Admitting: Internal Medicine

## 2016-07-30 ENCOUNTER — Other Ambulatory Visit: Payer: Self-pay | Admitting: Cardiology

## 2016-07-30 DIAGNOSIS — N183 Chronic kidney disease, stage 3 unspecified: Secondary | ICD-10-CM

## 2016-07-30 MED ORDER — TORSEMIDE 20 MG PO TABS
20.0000 mg | ORAL_TABLET | Freq: Every day | ORAL | 3 refills | Status: DC
Start: 2016-07-31 — End: 2017-02-26

## 2016-07-30 MED ORDER — POTASSIUM CHLORIDE CRYS ER 20 MEQ PO TBCR: 40.0000 meq | EXTENDED_RELEASE_TABLET | Freq: Every day | ORAL | 6 refills | Status: DC

## 2016-07-30 MED ORDER — LEVOTHYROXINE SODIUM 150 MCG PO TABS: 150.0000 ug | ORAL_TABLET | Freq: Every day | ORAL | 6 refills | Status: AC

## 2016-07-30 NOTE — Progress Notes (Signed)
Call placed to CCMD to notify of telemetry monitoring d/c.  IV discontinued. AVS reviewed with patient.  Vitals stable.  Pt c/a/ox4 at discharge.

## 2016-07-30 NOTE — Progress Notes (Signed)
Patient Name: Kara Codernne S Lambe Date of Encounter: 07/30/2016  Primary Cardiologist: Dr. Excell Seltzerooper  Electrophysiologist: Dr. Beverly SessionsKlein    Hospital Problem List     Principal Problem:   Acute on chronic systolic HF (heart failure) [facility](HCC) Active Problems:   Hypothyroidism   Essential hypertension   Complete heart block (HCC)   Aortic stenosis, severe     Subjective   Denies dyspnea or chest pain  Inpatient Medications    Scheduled Meds: . allopurinol  100 mg Oral Daily  . heparin  5,000 Units Subcutaneous Q8H  . levothyroxine  150 mcg Oral Once per day on Mon Tue Wed Thu Fri  . losartan  50 mg Oral Daily  . multivitamin  1 tablet Oral Daily  . potassium chloride  40 mEq Oral Daily  . sodium chloride flush  3 mL Intravenous Q12H  . torsemide  20 mg Oral Daily   Continuous Infusions:  PRN Meds: sodium chloride, acetaminophen, meclizine, ondansetron (ZOFRAN) IV, sodium chloride flush   Vital Signs    Vitals:   07/29/16 1958 07/29/16 1959 07/30/16 0500 07/30/16 0635  BP: (!) 107/38 (!) 115/40 122/66 (!) 119/49  Pulse: 65  63 (!) 57  Resp: 16  18   Temp: 97.5 F (36.4 C)  97.5 F (36.4 C) 98 F (36.7 C)  TempSrc: Oral  Oral Oral  SpO2: 99%  97% 98%  Weight:    128 lb 11.2 oz (58.4 kg)  Height:        Intake/Output Summary (Last 24 hours) at 07/30/16 0937 Last data filed at 07/30/16 0912  Gross per 24 hour  Intake              963 ml  Output             2000 ml  Net            -1037 ml   Filed Weights   07/28/16 0611 07/29/16 0558 07/30/16 0635  Weight: 132 lb 8 oz (60.1 kg) 129 lb 9.6 oz (58.8 kg) 128 lb 11.2 oz (58.4 kg)    Physical Exam   GEN: Well nourished, frail in no acute distress. HEENT: Grossly normal.  Neck: Supple Cardiac: RRR, 3/6 systolic murmur, trace pedal edema Respiratory:  CTA GI: Soft, nontender, nondistended. MS: no deformity or atrophy. Skin: warm and dry, no rash. Neuro:  Strength and sensation are intact. Psych: AAOx3.  Normal  affect.  Labs    Basic Metabolic Panel  Recent Labs  07/28/16 0332 07/29/16 0344  NA 139 139  K 3.9 4.0  CL 105 102  CO2 25 30  GLUCOSE 87 88  BUN 43* 45*  CREATININE 1.33* 1.62*  CALCIUM 9.2 9.2     Telemetry    AV paced with PVCs - Personally Reviewed     Cardiac Studies   2D ECHO: 04/03/2016 LV EF: 40% -   45% Study Conclusions - Left ventricle: The cavity size was normal. There was moderate   focal basal hypertrophy of the septum. Systolic function was   mildly to moderately reduced. The estimated ejection fraction was   in the range of 40% to 45%. Diffuse hypokinesis. - Ventricular septum: Septal motion showed abnormal function and   dyssynergy. - Aortic valve: Valve mobility was restricted. There was moderate   to severe stenosis. There was moderate regurgitation. Peak   velocity (S): 397 cm/s. Mean gradient (S): 31 mm Hg. - Mitral valve: There was moderate regurgitation. - Left atrium: The  atrium was severely dilated. - Tricuspid valve: There was mild-moderate regurgitation. - Pulmonic valve: There was moderate regurgitation. - Pulmonary arteries: PA peak pressure: 63 mm Hg (S). Impressions: - LVEF may be underestimated due to frequent ectopy and LBBB.   Unable to view images from 11/2015, but by report systolic   function has worsened since that time.   Patient Profile    Kara Cruz is a 82 y.o. female with a history of HTN, hypothyroidism, gout, CHB s/p Medtronic PPM, PAF (declined NOAC in past) chronic systolic CHF (EF 78-29%40-45%) and severe AS (declined TAVR) who was admitted from the office  for acute systolic CHF.  Assessment & Plan    Acute on Chronic diastolic CHF: Much improved; DC today on demadex 20 mg daily with additional 20 mg for weight gain of 2-3 lbs; low NA diet; check BMET Friday 12/29 with results to Dr Excell Seltzerooper.  Severe Stage D Aortic Stenosis: Pt has been seen by Dr. Excell Seltzerooper. TAVR recommended however patient declined invasive  treatment due to age of 82.  CHB s/p PPM: Followed closely by Dr. Graciela HusbandsKlein. Normal PPM function  HTN: BP controlled; continue present meds  Hypothyroidism: TSH 13.4. Synthroid increased from 100--> 150mcg. This will need to be followed by PCP  Chronic stage 3 CKD: Follow closely as outpt.  DC today and FU with TOC appt one week and Dr Excell Seltzerooper 8-12 weeks.  Signed, Olga MillersBrian Crenshaw, MD  07/30/2016, 9:37 AM

## 2016-07-30 NOTE — Discharge Summary (Signed)
Discharge Summary    Patient ID: Kara Cruz,  MRN: [id number], DOB/AGE: 80/04/1918 80 y.o.  Admit date: 07/23/2016 Discharge date: 07/30/2016  Primary Care Provider: Garlan FillersPATERSON,DANIEL G Primary Cardiologist: Dr. Excell Seltzerooper  Discharge Diagnoses    Principal Problem:   Acute on chronic systolic HF (heart failure) [facility](HCC) Active Problems:   Hypothyroidism   Essential hypertension   Complete heart block (HCC)   Aortic stenosis, severe   Allergies Allergies  Allergen Reactions  . Labetalol Swelling    Blisters around lips Swollen lips & eyes, blistering lips, dry mouth  . Prednisone Other (See Comments)    REACTION: SOB, difficulty swallowing  . Latex Itching  . Moxifloxacin Other (See Comments)    Doesn't remember  . Pseudoephedrine Hcl Other (See Comments)  . Codeine Sulfate Other (See Comments)    REACTION: "out of body experience"--"floats"  . Diphenhydramine Hcl Other (See Comments)    REACTION: headache, vertigo  . Doxycycline Hyclate Other (See Comments)    REACTION: nausea, vomiting  . Hibiclens [Chlorhexidine] Rash    wipes    Diagnostic Studies/Procedures    None _____________   History of Present Illness     82 yo female with PMH of severe stage D aortic stenosis, who recently refused intervention (TAVR offered). She also has chronic systolic HF with echo 04/03/16 showing an EF of 40-45%. She also has a PPM given h/o CHB and hx of HTN.   She was seen in the office on 12/19 where she reported recent issues with worsening LEE and weight gain. She also has exertional dyspnea but denies orthopnea and PND. No chest pain, syncope/ near syncope. She recently was advised by Dr. Excell Seltzerooper to increase her Lasix to 40 mg daily, however she has had no improvement with this. Her edema has progressed to the level of her thighs.  Hospital Course     Consultants: None  She was noted to be volume overloaded on admission with 3+ bilateral pitting edema to bilateral lower  extremities up to her thighs with exertional dyspnea. She was admitted with plans for IV lasix starting with 40mg  q8hrs with parameters for her blood pressure with her hx of severe AS. Her TSH was noted at 13.4 this admission and synthroid was increased from 100 to 150mcg. Her breathing improved with IV lasix and was net negative at total of 14L this admission. Weight at the time of discharge was 128lbs down from 150lbs at the time of admission. Her renal function was able to tolerate diuresis.   She was seen by Dr. Jens Somrenshaw on 12/26 and determined stable for discharge home. She will be discharged on demadex 20mg  daily with additional 20mg  for weight gain of 2-3lbs. I have arranged for her to have labs on 12/29 to follow up on her Cr level. She also has a follow up appt in the office with an APP.  _____________  Discharge Vitals Blood pressure (!) 119/49, pulse (!) 57, temperature 98 F (36.7 C), temperature source Oral, resp. rate 18, height 5' 2.5" (1.588 m), weight 128 lb 11.2 oz (58.4 kg), SpO2 98 %.  Filed Weights   07/28/16 0611 07/29/16 0558 07/30/16 0635  Weight: 132 lb 8 oz (60.1 kg) 129 lb 9.6 oz (58.8 kg) 128 lb 11.2 oz (58.4 kg)    Labs & Radiologic Studies    CBC No results for input(s): WBC, NEUTROABS, HGB, HCT, MCV, PLT in the last 72 hours. Basic Metabolic Panel  Recent Labs  07/28/16 [phone]  07/29/16 0344  NA 139 139  K 3.9 4.0  CL 105 102  CO2 25 30  GLUCOSE 87 88  BUN 43* 45*  CREATININE 1.33* 1.62*  CALCIUM 9.2 9.2   Liver Function Tests No results for input(s): AST, ALT, ALKPHOS, BILITOT, PROT, ALBUMIN in the last 72 hours. No results for input(s): LIPASE, AMYLASE in the last 72 hours. Cardiac Enzymes No results for input(s): CKTOTAL, CKMB, CKMBINDEX, TROPONINI in the last 72 hours. BNP Invalid input(s): POCBNP D-Dimer No results for input(s): DDIMER in the last 72 hours. Hemoglobin A1C No results for input(s): HGBA1C in the last 72 hours. Fasting Lipid  Panel No results for input(s): CHOL, HDL, LDLCALC, TRIG, CHOLHDL, LDLDIRECT in the last 72 hours. Thyroid Function Tests No results for input(s): TSH, T4TOTAL, T3FREE, THYROIDAB in the last 72 hours.  Invalid input(s): FREET3 _____________  No results found. Disposition   Pt is being discharged home today in good condition.  Follow-up Plans & Appointments    Follow-up Information    Nada Boozer, NP Follow up on 08/08/2016.   Specialties:  Cardiology, Radiology Why:  at 8:45am for your follow up appt.  Contact information: [address] [place] [address] [phone]        [facility] [address] Office Follow up on 08/02/2016.   Specialty:  Cardiology Why:  Please come to the lab to have your blood work done between the hours of 8:30 and 4pm.  Contact information: [address], [address] [place] [address] [phone]       Garlan Fillers, MD Follow up.   Specialty:  Internal Medicine Why:  you will need to arrange a follow up appt with your PCP to check your thyroid levels in 6 weeks. Thanks Contact information: [address] [place] [address] [phone]          Discharge Instructions    (HEART FAILURE PATIENTS) Call MD:  Anytime you have any of the following symptoms: 1) 3 pound weight gain in 24 hours or 5 pounds in 1 week 2) shortness of breath, with or without a dry hacking cough 3) swelling in the hands, feet or stomach 4) if you have to sleep on extra pillows at night in order to breathe.    Complete by:  As directed    Diet - low sodium heart healthy    Complete by:  As directed    Discharge instructions    Complete by:  As directed    We increased your synthroid this admission so you will need to follow up with your regular doctor to have your thyroid levels rechecked. Also please come to have your labs done at the office this Friday 12/29.   Increase activity slowly    Complete by:  As directed         Discharge Medications   Current Discharge Medication List    START taking these medications   Details  potassium chloride SA (K-DUR,KLOR-CON) 20 MEQ tablet Take 2 tablets (40 mEq total) by mouth daily. Qty: 30 tablet, Refills: 6    torsemide (DEMADEX) 20 MG tablet Take 1 tablet (20 mg total) by mouth daily. Qty: 60 tablet, Refills: 3      CONTINUE these medications which have CHANGED   Details  levothyroxine (SYNTHROID, LEVOTHROID) 150 MCG tablet Take 1 tablet (150 mcg total) by mouth daily before breakfast. Qty: 30 tablet, Refills: 6      CONTINUE these medications which have NOT  CHANGED   Details  allopurinol (ZYLOPRIM) 100 MG tablet Take 100 mg by mouth daily.    calcium carbonate (TUMS EX) 750 MG chewable tablet Chew 2 tablets by mouth daily.    cholecalciferol (VITAMIN D) 1000 UNITS tablet Take 1,000 Units by mouth daily.    losartan (COZAAR) 50 MG tablet Take by mouth daily as needed. TAKE 1 TAB BY MOUTH ONLY IF BP IS HIGH EARLY IN THE DAY Qty: 90 tablet, Refills: 3    Lutein 20 MG TABS Take 20 mg by mouth daily.     meclizine (ANTIVERT) 25 MG tablet Take 25 mg by mouth daily as needed for dizziness (or vertigo).     Multiple Vitamins-Minerals (PRESERVISION AREDS 2 PO) Take 1 capsule by mouth 2 (two) times daily.    Polyvinyl Alcohol-Povidone (REFRESH OP) Place 1 drop into both eyes at bedtime.      STOP taking these medications     furosemide (LASIX) 40 MG tablet      Potassium Bicarbonate 99 MG CAPS           Outstanding Labs/Studies   BMET on 12/29.   Duration of Discharge Encounter   Greater than 30 minutes including physician time.  Signed, Laverda PageLindsay Roberts NP-C 07/30/2016, 10:18 AM

## 2016-07-30 NOTE — Telephone Encounter (Signed)
TCM Phone Call. Appt is 08/08/16 at 9am w/ Nada BoozerLaura Ingold . Thanks

## 2016-07-31 NOTE — Telephone Encounter (Signed)
[facility]. I spoke with her daughter Kara Cruz (DPR on file).   The daughter states that the patient understands to follow up with provider Kara BoozerLaura Ingold, FNP on 08/08/16 at 9:00 am at [address], [place], [place] [id number] . The daughter states that the patient understands discharge instructions? Yes  The daughter states that the patient understands medications and regiment? Yes  The daughter states that the patient understands to bring all medications to this visit? Yes

## 2016-08-02 ENCOUNTER — Other Ambulatory Visit: Payer: Medicare Other | Admitting: *Deleted

## 2016-08-02 DIAGNOSIS — N183 Chronic kidney disease, stage 3 unspecified: Secondary | ICD-10-CM

## 2016-08-02 LAB — BASIC METABOLIC PANEL
BUN: 54 mg/dL — ABNORMAL HIGH (ref 7–25)
CALCIUM: 9.8 mg/dL (ref 8.6–10.4)
CHLORIDE: 100 mmol/L (ref 98–110)
CO2: 24 mmol/L (ref 20–31)
Creat: 1.49 mg/dL — ABNORMAL HIGH (ref 0.60–0.88)
GLUCOSE: 82 mg/dL (ref 65–99)
Potassium: 5 mmol/L (ref 3.5–5.3)
SODIUM: 137 mmol/L (ref 135–146)

## 2016-08-08 ENCOUNTER — Ambulatory Visit: Payer: Medicare Other | Admitting: Cardiology

## 2016-08-12 ENCOUNTER — Encounter: Payer: Self-pay | Admitting: Cardiovascular Disease

## 2016-08-12 NOTE — Telephone Encounter (Signed)
Ms. Kara Cruz is returning your call. Thanks.

## 2016-08-12 NOTE — Telephone Encounter (Signed)
This encounter was created in error - please disregard.

## 2016-08-13 ENCOUNTER — Encounter: Payer: Self-pay | Admitting: Nurse Practitioner

## 2016-08-13 ENCOUNTER — Ambulatory Visit (INDEPENDENT_AMBULATORY_CARE_PROVIDER_SITE_OTHER): Payer: Medicare Other | Admitting: Nurse Practitioner

## 2016-08-13 VITALS — BP 150/70 | HR 90 | Ht 62.5 in | Wt 125.4 lb

## 2016-08-13 DIAGNOSIS — I480 Paroxysmal atrial fibrillation: Secondary | ICD-10-CM | POA: Diagnosis not present

## 2016-08-13 DIAGNOSIS — I5033 Acute on chronic diastolic (congestive) heart failure: Secondary | ICD-10-CM

## 2016-08-13 DIAGNOSIS — I350 Nonrheumatic aortic (valve) stenosis: Secondary | ICD-10-CM | POA: Diagnosis not present

## 2016-08-13 MED ORDER — POTASSIUM CHLORIDE CRYS ER 20 MEQ PO TBCR: 20.0000 meq | EXTENDED_RELEASE_TABLET | Freq: Every day | ORAL | 6 refills | Status: DC

## 2016-08-13 NOTE — Progress Notes (Signed)
CARDIOLOGY OFFICE NOTE  Date:  08/13/2016    [name] Date of Birth: 1917/08/28 Medical Record #[id number]  PCP:  Garlan Fillers, MD  Cardiologist:  Verlon Au  Chief Complaint  Patient presents with  . Aortic Stenosis    TOC - seen for Dr. Excell Seltzer and Graciela Husbands    History of Present Illness: Kara Cruz is a 83 y.o. female who presents today for a follow up visit/post hospital (TOC 14). Seen for Dr. Excell Seltzer and Dr. Graciela Husbands.   She has a h/o severe stage D aortic stenosis, who recently refused intervention (TAVR offered). She also has chronic systolic HF with echo 04/03/16 showing an EF of 40-45%. She also has a PPM given h/o CHB that is followed by Dr. Graciela Husbands. She also has HTN.   Seen as a work in a few weeks ago by Dr. Katrinka Blazing for issues with worsening LEE and weight gain. She also has exertional dyspnea but denies orthopnea and PND. No chest pain, syncope/ near syncope. She had been advised by Dr. Excell Seltzer to increase her Lasix to 40 mg daily, however she had no improvement with this. Her edema progressed to the level of her thighs.  She basically failed on outpatient therapy and was admitted for IV diuresis.   She was admitted with plans for IV lasix starting with 40mg  q8hrs with parameters for her blood pressure with her hx of severe AS. Her TSH was noted at 13.4 this admission and synthroid was increased from 100 to . Her breathing improved with IV lasix and was net negative at total of 14L this admission. Weight at the time of discharge was 128lbs down from 150lbs at the time of admission. Her renal function was able to tolerate diuresis. She was seen by Dr. Jens Som on 12/26 and determined stable for discharge home. She was discharged on demadex 20mg  daily with additional 20mg  for weight gain of 2-3lbs.   Comes in today. Here with her "baby brother". In a wheelchair. Seems comfortable. She says she is "feeling great!" Her breathing is ok. Her swelling has improved. Her  weight is stable. She is on prophylactic Tamiflu - she has a daughter that has bone cancer and has been in and out of numerous health facilities. No chest pain. Not dizzy. She is happy that her swelling has improved. Responds better with the Torsemide. Having trouble swallowing the 2 potassiums - lab last week showed a potassium of 5.0. She is happy with how she is currently doing.   Past Medical History:  Diagnosis Date  . Acute on chronic systolic heart failure (HCC)   . Age-related macular degeneration   . Aortic stenosis    a. moderate  . AV heart block    a. 09/2014 Med[employer]onic MRI compatible dual chamber pacemaker.  . Frequent headaches   . Gout   . HTN (hypertension)   . Hypothyroidism   . Osteoarthritis   . Paroxysmal atrial fibrillation (HCC)    a. identified by device interrogation    Past Surgical History:  Procedure Laterality Date  . DILATATION & CURETTAGE/HYSTEROSCOPY WITH MYOSURE    . euflexa into knees    . eye injections    . PERMANENT PACEMAKER INSERTION N/A 09/14/2014   MDT MRI compatible dual chamber pacemaker implanted by Dr Graciela Husbands  . TONSILLECTOMY     1928     Medications: Current Outpatient Prescriptions  Medication Sig Dispense Refill  . allopurinol (ZYLOPRIM) 100 MG tablet Take 100 mg by  mouth daily.    . calcium carbonate (TUMS EX) 750 MG chewable tablet Chew 2 tablets by mouth daily.    . cholecalciferol (VITAMIN D) 1000 UNITS tablet Take 1,000 Units by mouth daily.    Marland Kitchen. levothyroxine (SYNTHROID, LEVOTHROID) 150 MCG tablet Take 1 tablet (150 mcg total) by mouth daily before breakfast. 30 tablet 6  . losartan (COZAAR) 50 MG tablet Take by mouth daily as needed. TAKE 1 TAB BY MOUTH ONLY IF BP IS HIGH EARLY IN THE DAY 90 tablet 3  . Lutein 20 MG TABS Take 20 mg by mouth daily.     . meclizine (ANTIVERT) 25 MG tablet Take 25 mg by mouth daily as needed for dizziness (or vertigo).     . Multiple Vitamins-Minerals (PRESERVISION AREDS 2 PO) Take 1 capsule by  mouth 2 (two) times daily.    Marland Kitchen. oseltamivir (TAMIFLU) 30 MG capsule Take 30 mg by mouth daily.    . Polyvinyl Alcohol-Povidone (REFRESH OP) Place 1 drop into both eyes at bedtime.    . potassium chloride SA (K-DUR,KLOR-CON) 20 MEQ tablet Take 1 tablet (20 mEq total) by mouth daily. 30 tablet 6  . torsemide (DEMADEX) 20 MG tablet Take 1 tablet (20 mg total) by mouth daily. 60 tablet 3   No current facility-administered medications for this visit.     Allergies: Allergies  Allergen Reactions  . Labetalol Swelling    Blisters around lips Swollen lips & eyes, blistering lips, dry mouth  . Prednisone Other (See Comments)    REACTION: SOB, difficulty swallowing  . Latex Itching  . Moxifloxacin Other (See Comments)    Doesn't remember  . Pseudoephedrine Hcl Other (See Comments)  . Codeine Sulfate Other (See Comments)    REACTION: "out of body experience"--"floats"  . Diphenhydramine Hcl Other (See Comments)    REACTION: headache, vertigo  . Doxycycline Hyclate Other (See Comments)    REACTION: nausea, vomiting  . Hibiclens [Chlorhexidine] Rash    wipes    Social History: The patient  reports that she has quit smoking. She has never used smokeless tobacco. She reports that she does not drink alcohol or use drugs.   Family History: The patient's family history includes Arthritis in her brother and mother; Edema in her brother; Heart attack in her father; Heart disease in her brother and brother; Hypertension in her mother; Kidney failure in her brother; Prostate cancer in her brother; Stroke in her brother and mother.   Review of Systems: Please see the history of present illness.   Otherwise, the review of systems is positive for none.   All other systems are reviewed and negative.   Physical Exam: VS:  BP (!) 150/70   Pulse 90   Ht 5' 2.5" (1.588 m)   Wt 125 lb 6.4 oz (56.9 kg)   SpO2 99%   BMI 22.57 kg/m  .  BMI Body mass index is 22.57 kg/m.  Wt Readings from Last 3  Encounters:  08/13/16 125 lb 6.4 oz (56.9 kg)  07/30/16 128 lb 11.2 oz (58.4 kg)  07/23/16 152 lb (68.9 kg)    General: Pleasant. Elderly female who is alert and in no acute distress. Her weight is down 3 more pounds. She actually seems pretty spry.   HEENT: Normal.  Neck: Supple, no JVD, carotid bruits, or masses noted.  Cardiac: Regular rate and rhythm. Harsh outflow murmur. Trace edema.  Respiratory:  Lungs are clear to auscultation bilaterally with normal work of breathing.  GI: Soft  and nontender.  MS: No deformity or atrophy. Gait not tested Skin: Warm and dry. Color is normal.  Neuro:  Strength and sensation are intact and no gross focal deficits noted.  Psych: Alert, appropriate and with normal affect.   LABORATORY DATA:  EKG:  EKG is not ordered today.  Lab Results  Component Value Date   WBC 5.2 07/23/2016   HGB 12.0 07/23/2016   HCT 37.5 07/23/2016   PLT 197 07/23/2016   GLUCOSE 82 08/02/2016   ALT 28 11/23/2007   AST 35 11/23/2007   NA 137 08/02/2016   K 5.0 08/02/2016   CL 100 08/02/2016   CREATININE 1.49 (H) 08/02/2016   BUN 54 (H) 08/02/2016   CO2 24 08/02/2016   TSH 13.438 (H) 07/23/2016    BNP (last 3 results)  Recent Labs  11/03/15 1222  BNP 355.0*    ProBNP (last 3 results) No results for input(s): PROBNP in the last 8760 hours.   Other Studies Reviewed Today:  Echo Study Conclusions 03/2016  - Left ventricle: The cavity size was normal. There was moderate   focal basal hypertrophy of the septum. Systolic function was   mildly to moderately reduced. The estimated ejection fraction was   in the range of 40% to 45%. Diffuse hypokinesis. - Ventricular septum: Septal motion showed abnormal function and   dyssynergy. - Aortic valve: Valve mobility was restricted. There was moderate   to severe stenosis. There was moderate regurgitation. Peak   velocity (S): 397 cm/s. Mean gradient (S): 31 mm Hg. - Mitral valve: There was moderate  regurgitation. - Left atrium: The atrium was severely dilated. - Tricuspid valve: There was mild-moderate regurgitation. - Pulmonic valve: There was moderate regurgitation. - Pulmonary arteries: PA peak pressure: 63 mm Hg (S).  Impressions:  - LVEF may be underestimated due to frequent ectopy and LBBB.   Unable to view images from 11/2015, but by report systolic   function has worsened since that time.   Assessment/Plan: 1. Acute on chronic systolic HF - weight is down. Looks better clinically. Cutting potassium back to just one. Recheck lab today. Reminded to continue to weigh daily and use extra diuretic prn weight gain over 2 pounds over night. I will see her back.   2. CHB with underlying PPM in place - followed by EP  3. Severe AS - has refused TAVR  4. HTN - BP fair. She seems a little excited today. Would monitor for now.   5. Hypothyroidism with recent increase in replacement therapy - she tells me she has seen Dr. Eloise Harman and he has adjusted her medicine and will be following. I will defer to him.   6. Advanced age  Current medicines are reviewed with the patient today.  The patient does not have concerns regarding medicines other than what has been noted above.  The following changes have been made:  See above.  Labs/ tests ordered today include:    Orders Placed This Encounter  Procedures  . Basic metabolic panel  . CBC     Disposition:   FU with me/Dr. Excell Seltzer in 2 weeks. I will be happy to see going forward.  Patient is agreeable to this plan and will call if any problems develop in the interim.   Signed: Rosalio Macadamia, RN, ANP-C 08/13/2016 3:30 PM  [facility] [address] [place], [place]  [address] Phone: [phone] Fax: [phone]

## 2016-08-13 NOTE — Patient Instructions (Addendum)
We will be checking the following labs today - BMET, CBC   Medication Instructions:    Continue with your current medicines.   I am cutting the potassium back to just one a day - ok to break and let dissolve in water and then drink    Testing/Procedures To Be Arranged:  N/A  Follow-Up:   See me in about 2 weeks.     Other Special Instructions:   Keep weighing daily - use an extra dose of Demadex if your weight goes over 2 pounds over night.     If you need a refill on your cardiac medications before your next appointment, please call your pharmacy.   Call the [facility] office at [phone] if you have any questions, problems or concerns.

## 2016-08-14 LAB — CBC
Hematocrit: 37 % (ref 34.0–46.6)
Hemoglobin: 11.8 g/dL (ref 11.1–15.9)
MCH: 26.9 pg (ref 26.6–33.0)
MCHC: 31.9 g/dL (ref 31.5–35.7)
MCV: 85 fL (ref 79–97)
Platelets: 212 10*3/uL (ref 150–379)
RBC: 4.38 x10E6/uL (ref 3.77–5.28)
RDW: 16.1 % — ABNORMAL HIGH (ref 12.3–15.4)
WBC: 5.3 10*3/uL (ref 3.4–10.8)

## 2016-08-14 LAB — BASIC METABOLIC PANEL
BUN/Creatinine Ratio: 41 — ABNORMAL HIGH (ref 12–28)
BUN: 74 mg/dL — ABNORMAL HIGH (ref 10–36)
CO2: 23 mmol/L (ref 18–29)
Calcium: 9.1 mg/dL (ref 8.7–10.3)
Chloride: 99 mmol/L (ref 96–106)
Creatinine, Ser: 1.79 mg/dL — ABNORMAL HIGH (ref 0.57–1.00)
GFR calc Af Amer: 27 mL/min/{1.73_m2} — ABNORMAL LOW (ref >59)
GFR calc non Af Amer: 24 mL/min/{1.73_m2} — ABNORMAL LOW (ref >59)
Glucose: 90 mg/dL (ref 65–99)
Potassium: 4 mmol/L (ref 3.5–5.2)
Sodium: 139 mmol/L (ref 134–144)

## 2016-08-18 IMAGING — CR DG CHEST 2V
2 series · 2 of 2 positions shown · non-contrast
Comparison: PA and lateral chest 09/14/2007.

CLINICAL DATA: Status post pacemaker placement.

EXAM:
CHEST  2 VIEW

[chest lat]
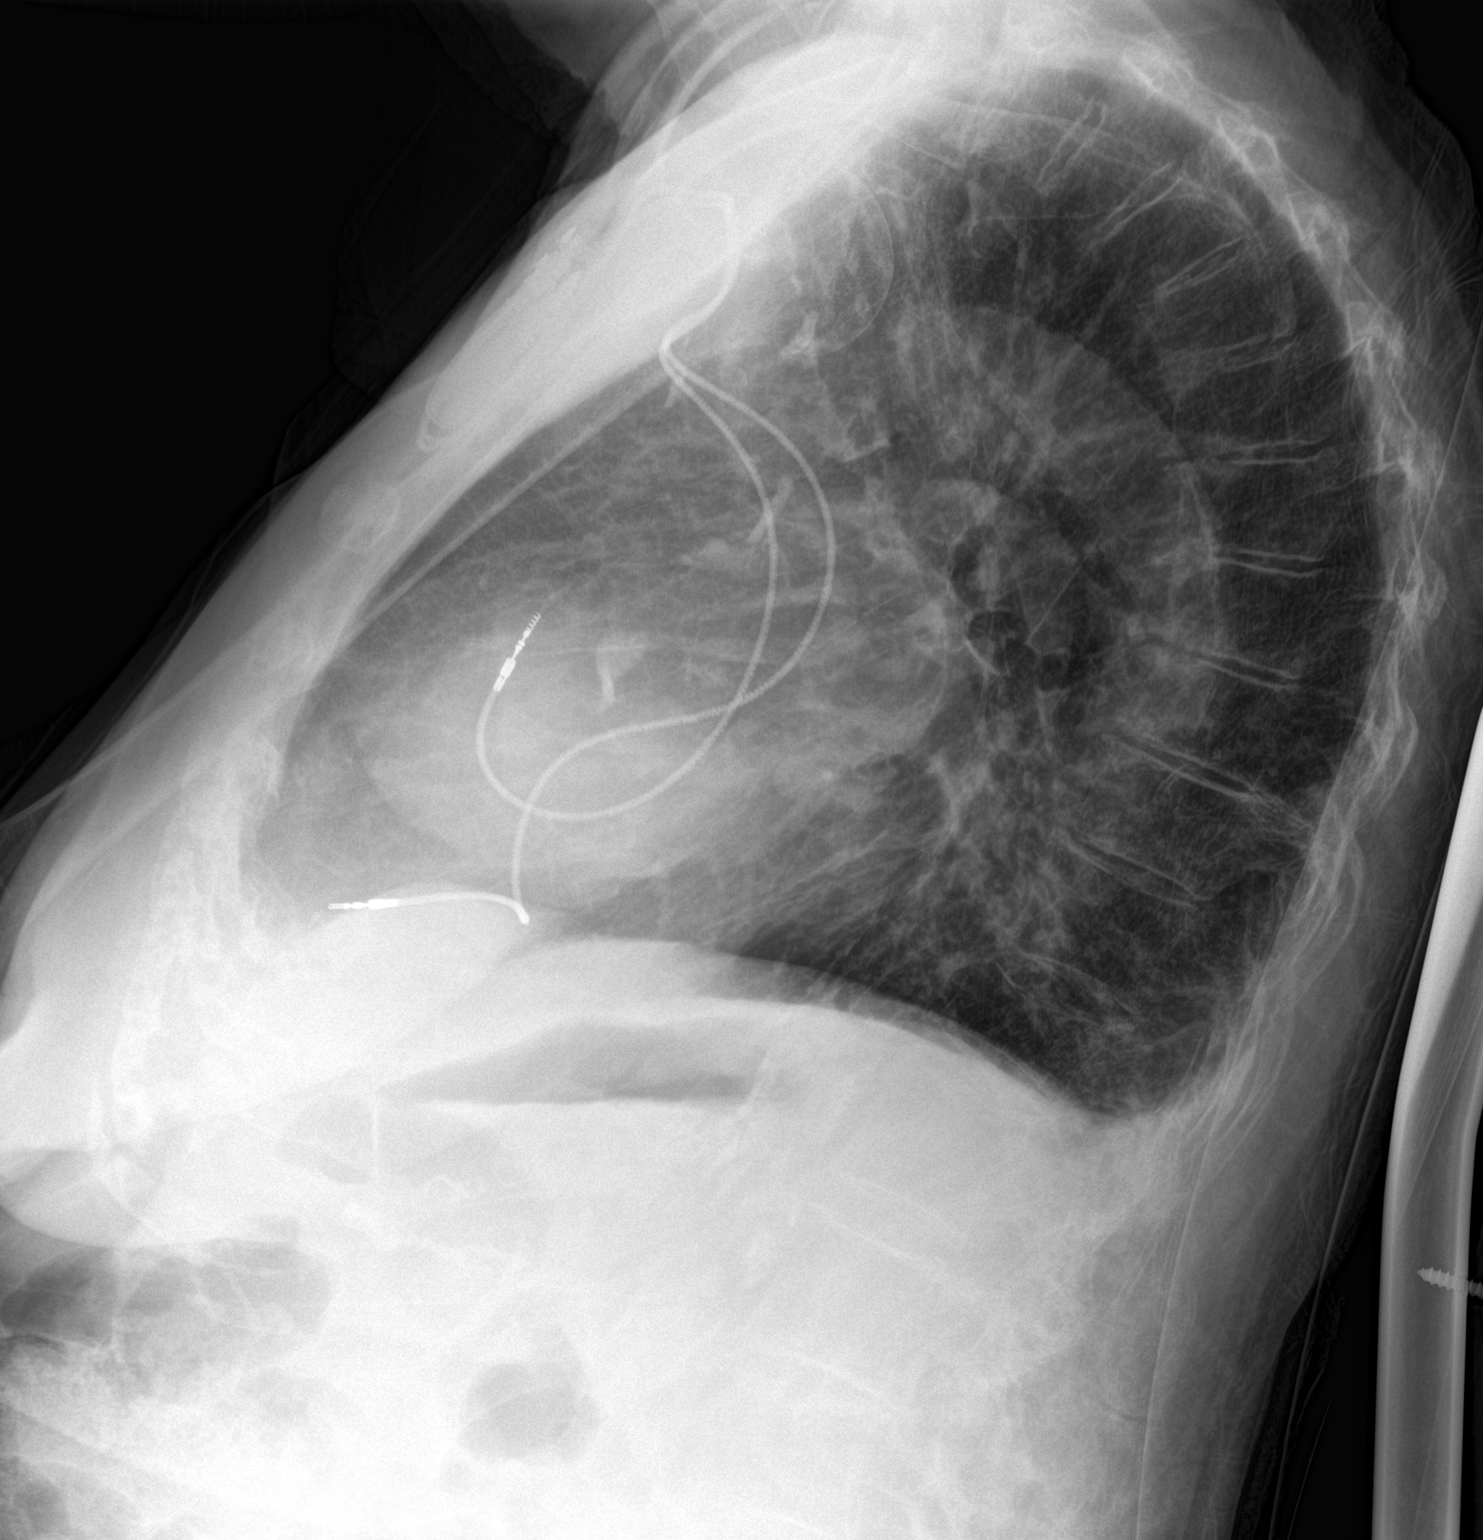

[chest ap]
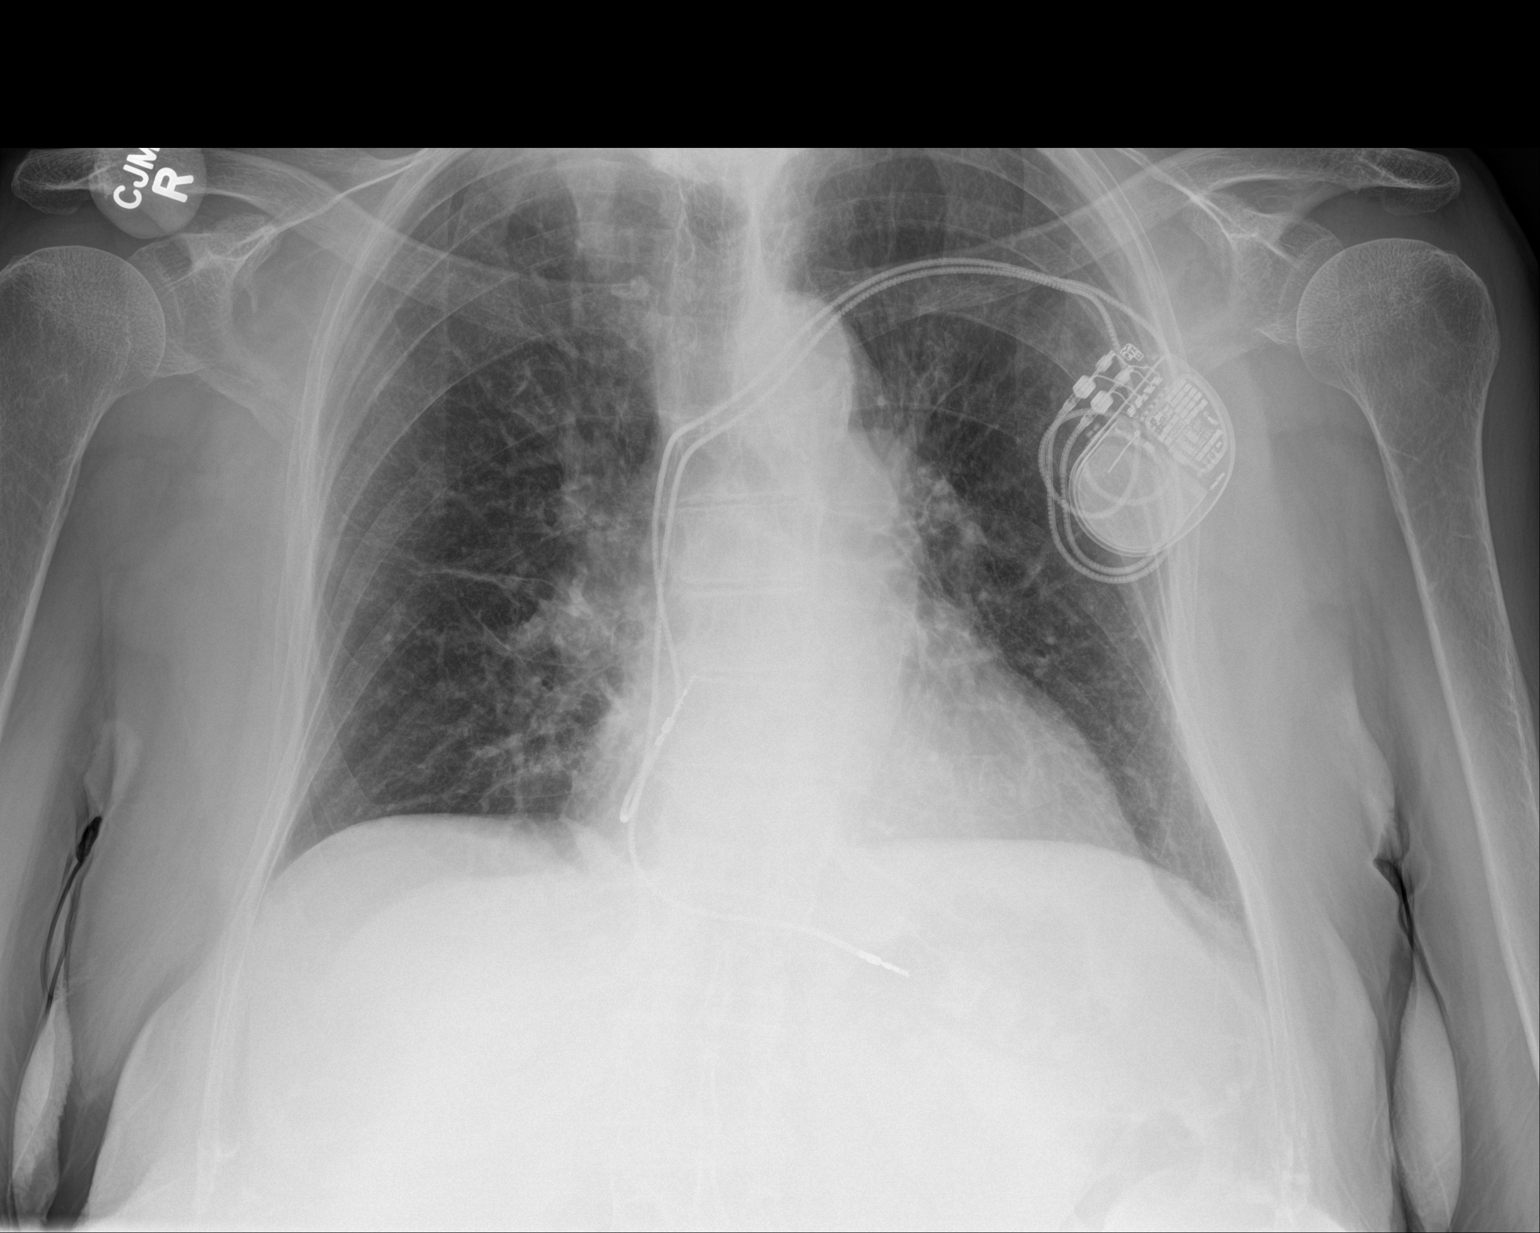

[2 of 2 positions shown; findings below may reference images not displayed]

FINDINGS: The patient has a new two lead pacing device in place with the tip
of the proximal lead in the right atrium and distal lead in the apex
of the right ventricle. There is no pneumothorax. Heart size is
mildly enlarged. No pulmonary edema or focal airspace disease is
seen. Very small bilateral pleural effusions are noted.
IMPRESSION: New pacing device in place without pneumothorax or other
complicating feature.

Cardiomegaly without edema.

Trace bilateral pleural effusions.

## 2016-08-19 ENCOUNTER — Ambulatory Visit (INDEPENDENT_AMBULATORY_CARE_PROVIDER_SITE_OTHER): Payer: Medicare Other | Admitting: *Deleted

## 2016-08-19 DIAGNOSIS — I442 Atrioventricular block, complete: Secondary | ICD-10-CM

## 2016-08-19 NOTE — Progress Notes (Signed)
Remote pacemaker transmission.   

## 2016-08-20 NOTE — Progress Notes (Signed)
Thanks

## 2016-08-23 LAB — CUP PACEART REMOTE DEVICE CHECK
Battery Remaining Longevity: 86 mo
Battery Voltage: 3.01 V
Brady Statistic AS VS Percent: 3.73 %
Brady Statistic RV Percent Paced: 88.49 %
Date Time Interrogation Session: 20180115142013
Implantable Lead Implant Date: 20160210
Implantable Lead Location: 753859
Implantable Lead Model: 5076
Implantable Pulse Generator Implant Date: 20160210
Lead Channel Impedance Value: 456 Ohm
Lead Channel Impedance Value: 513 Ohm
Lead Channel Pacing Threshold Amplitude: 0.625 V
Lead Channel Pacing Threshold Amplitude: 0.875 V
Lead Channel Pacing Threshold Pulse Width: 0.4 ms
Lead Channel Sensing Intrinsic Amplitude: 1.375 mV
Lead Channel Setting Pacing Amplitude: 2.5 V
Lead Channel Setting Sensing Sensitivity: 2 mV
MDC IDC LEAD IMPLANT DT: 20160210
MDC IDC LEAD LOCATION: 753860
MDC IDC MSMT LEADCHNL RA IMPEDANCE VALUE: 342 Ohm
MDC IDC MSMT LEADCHNL RA SENSING INTR AMPL: 1.375 mV
MDC IDC MSMT LEADCHNL RV IMPEDANCE VALUE: 361 Ohm
MDC IDC MSMT LEADCHNL RV PACING THRESHOLD PULSEWIDTH: 0.4 ms
MDC IDC MSMT LEADCHNL RV SENSING INTR AMPL: 6.875 mV
MDC IDC MSMT LEADCHNL RV SENSING INTR AMPL: 6.875 mV
MDC IDC SET LEADCHNL RA PACING AMPLITUDE: 2 V
MDC IDC SET LEADCHNL RV PACING PULSEWIDTH: 0.4 ms
MDC IDC STAT BRADY AP VP PERCENT: 33.99 %
MDC IDC STAT BRADY AP VS PERCENT: 0.34 %
MDC IDC STAT BRADY AS VP PERCENT: 61.94 %
MDC IDC STAT BRADY RA PERCENT PACED: 31.58 %

## 2016-08-26 NOTE — Progress Notes (Signed)
CARDIOLOGY OFFICE NOTE  Date:  08/27/2016    Kara CoderAnne S Beamon Date of Birth: 01/23/1918 Medical Record #[id number]  PCP:  Garlan FillersPATERSON,DANIEL G, MD  Cardiologist:  [facility]    Chief Complaint  Patient presents with  . Congestive Heart Failure    Follow up visit - seen for Dr. Excell Seltzerooper    History of Present Illness: Kara Cruz is a [age] y.o. female who presents today for a 2 week check. Seen for Dr. Excell Seltzerooper and Dr. Graciela HusbandsKlein.   She has a h/o severe stage D aortic stenosis, who recently refused intervention (TAVR offered). She also has chronic systolic HF with echo 04/03/16 showing an EF of 40-45%. She also has a PPM given h/o CHB that is followed by Dr. Graciela HusbandsKlein. She also has HTN.   Seen as a work in a few weeks ago by Dr. Katrinka BlazingSmith for issues with worsening LEE and weight gain. She also has exertional dyspnea but denies orthopnea and PND. No chest pain, syncope/ near syncope. She had been advised by Dr. Excell Seltzerooper to increase her Lasix to 40 mg daily, however she had no improvement with this. Her edema progressed to the level of her thighs. She basically failed on outpatient therapy and was admitted for IV diuresis.   She was admitted with plans for IV lasix starting with 40mg  q8hrs with parameters for her blood pressure with her hx of severe AS. Her TSH was noted at 13.4 this admission and synthroid was increased from 100 to 150mcg. Her breathing improved with IV lasix and was net negative at total of 14L this admission. Weight at the time of discharge was 128lbs down from 150lbs at the time of admission. Her renal function was able to tolerate diuresis. She was seen by Dr. Jens Somrenshaw on 12/26 and determined stable for discharge home. She was discharged on demadex 20mg  daily with additional 20mg  for weight gain of 2-3lbs.   I saw her earlier this month for her post hospital visit. She was comfortable and doing ok. Weight was stable. Was able to cut the potassium back some.   Comes in  today. Here with her "baby brother" again. She says she is doing ok. Gets a little breathless with talking. Elevating her legs. Her weights and BPs have been stable at home. No chest pain. No syncope. She says she is content with her current situation.  Past Medical History:  Diagnosis Date  . Acute on chronic systolic heart failure (HCC)   . Age-related macular degeneration   . Aortic stenosis    a. moderate  . AV heart block    a. 09/2014 Medtronic MRI compatible dual chamber pacemaker.  . Frequent headaches   . Gout   . HTN (hypertension)   . Hypothyroidism   . Osteoarthritis   . Paroxysmal atrial fibrillation (HCC)    a. identified by device interrogation    Past Surgical History:  Procedure Laterality Date  . DILATATION & CURETTAGE/HYSTEROSCOPY WITH MYOSURE    . euflexa into knees    . eye injections    . PERMANENT PACEMAKER INSERTION N/A 09/14/2014   MDT MRI compatible dual chamber pacemaker implanted by [name]  . TONSILLECTOMY     1928     Medications: Current Outpatient Prescriptions  Medication Sig Dispense Refill  . allopurinol (ZYLOPRIM) 100 MG tablet Take 100 mg by mouth daily.    . calcium carbonate (TUMS EX) 750 MG chewable tablet Chew 2 tablets by mouth daily.    .Marland Kitchen  cholecalciferol (VITAMIN D) 1000 UNITS tablet Take 1,000 Units by mouth daily.    Marland Kitchen levothyroxine (SYNTHROID, LEVOTHROID) 150 MCG tablet Take 1 tablet (150 mcg total) by mouth daily before breakfast. 30 tablet 6  . losartan (COZAAR) 50 MG tablet Take by mouth daily as needed. TAKE 1 TAB BY MOUTH ONLY IF BP IS HIGH EARLY IN THE DAY 90 tablet 3  . Lutein 20 MG TABS Take 20 mg by mouth daily.     . meclizine (ANTIVERT) 25 MG tablet Take 25 mg by mouth daily as needed for dizziness (or vertigo).     . Multiple Vitamins-Minerals (PRESERVISION AREDS 2 PO) Take 1 capsule by mouth 2 (two) times daily.    . Polyvinyl Alcohol-Povidone (REFRESH OP) Place 1 drop into both eyes at bedtime.    . potassium  chloride SA (K-DUR,KLOR-CON) 20 MEQ tablet Take 1 tablet (20 mEq total) by mouth daily. 30 tablet 6  . torsemide (DEMADEX) 20 MG tablet Take 1 tablet (20 mg total) by mouth daily. 60 tablet 3   No current facility-administered medications for this visit.     Allergies: Allergies  Allergen Reactions  . Labetalol Swelling    Blisters around lips Swollen lips & eyes, blistering lips, dry mouth  . Prednisone Other (See Comments)    REACTION: SOB, difficulty swallowing  . Latex Itching  . Moxifloxacin Other (See Comments)    Doesn't remember  . Pseudoephedrine Hcl Other (See Comments)  . Codeine Sulfate Other (See Comments)    REACTION: "out of body experience"--"floats"  . Diphenhydramine Hcl Other (See Comments)    REACTION: headache, vertigo  . Doxycycline Hyclate Other (See Comments)    REACTION: nausea, vomiting  . Hibiclens [Chlorhexidine] Rash    wipes    Social History: The patient  reports that she has quit smoking. She has never used smokeless tobacco. She reports that she does not drink alcohol or use drugs.   Family History: The patient's family history includes Arthritis in her brother and mother; Edema in her brother; Heart attack in her father; Heart disease in her brother and brother; Hypertension in her mother; Kidney failure in her brother; Prostate cancer in her brother; Stroke in her brother and mother.   Review of Systems: Please see the history of present illness.   Otherwise, the review of systems is positive for none.   All other systems are reviewed and negative.   Physical Exam: VS:  BP (!) 142/86   Pulse 86   Ht 5' 2.5" (1.588 m)   Wt 129 lb 9.6 oz (58.8 kg)   SpO2 94% Comment: at rest  BMI 23.33 kg/m  .  BMI Body mass index is 23.33 kg/m.  Wt Readings from Last 3 Encounters:  08/27/16 129 lb 9.6 oz (58.8 kg)  08/13/16 125 lb 6.4 oz (56.9 kg)  07/30/16 128 lb 11.2 oz (58.4 kg)    General: Pleasant. Elderly female who is alert and in no acute  distress.  Weight is up 4 pounds from last visit but only 1 pound since discharge. Weights at home are stable.  HEENT: Normal.  Neck: Supple, no JVD, carotid bruits, or masses noted.  Cardiac: Regular rate and rhythm. Harsh outflow murmur.   Trace edema.  Respiratory:  Lungs are clear to auscultation bilaterally with normal work of breathing.  GI: Soft and nontender.  MS: No deformity or atrophy. Gait not tested. She is in a wheelchair.  Skin: Warm and dry. Color is normal.  Neuro:  Strength and sensation are intact and no gross focal deficits noted.  Psych: Alert, appropriate and with normal affect.   LABORATORY DATA:  EKG:  EKG is not ordered today.  Lab Results  Component Value Date   WBC 5.3 08/13/2016   HGB 12.0 07/23/2016   HCT 37.0 08/13/2016   PLT 212 08/13/2016   GLUCOSE 90 08/13/2016   ALT 28 11/23/2007   AST 35 11/23/2007   NA 139 08/13/2016   K 4.0 08/13/2016   CL 99 08/13/2016   CREATININE 1.79 (H) 08/13/2016   BUN 74 (H) 08/13/2016   CO2 23 08/13/2016   TSH 13.438 (H) 07/23/2016    BNP (last 3 results)  Recent Labs  11/03/15 1222  BNP 355.0*    ProBNP (last 3 results) No results for input(s): PROBNP in the last 8760 hours.   Other Studies Reviewed Today:  Echo Study Conclusions 03/2016  - Left ventricle: The cavity size was normal. There was moderate focal basal hypertrophy of the septum. Systolic function was mildly to moderately reduced. The estimated ejection fraction was in the range of 40% to 45%. Diffuse hypokinesis. - Ventricular septum: Septal motion showed abnormal function and dyssynergy. - Aortic valve: Valve mobility was restricted. There was moderate to severe stenosis. There was moderate regurgitation. Peak velocity (S): 397 cm/s. Mean gradient (S): 31 mm Hg. - Mitral valve: There was moderate regurgitation. - Left atrium: The atrium was severely dilated. - Tricuspid valve: There was mild-moderate regurgitation. -  Pulmonic valve: There was moderate regurgitation. - Pulmonary arteries: PA peak pressure: 63 mm Hg (S).  Impressions:  - LVEF may be underestimated due to frequent ectopy and LBBB. Unable to view images from 11/2015, but by report systolic function has worsened since that time.   Assessment/Plan: 1. Acute on chronic systolic HF - weight is stable at home. She seems to be holding her own but with overall tenuous and guarded prognosis going forward.   Recheck lab today. Reminded to continue to weigh daily and use extra diuretic prn weight gain over 2 pounds over night. I will see her back in about a month.   2. CHB with underlying PPM in place - followed by EP  3. Severe AS - has refused TAVR - no real cardinal symptoms noted.   4. HTN - BP fair. She has good control at home. No change with current regimen.   5. Hypothyroidism with recent increase in replacement therapy - she tells me she has seen Dr. Eloise Harman and he has adjusted her medicine and will be following. I will defer to him.   6. Advanced age  Current medicines are reviewed with the patient today.  The patient does not have concerns regarding medicines other than what has been noted above.  The following changes have been made:  See above.  Labs/ tests ordered today include:    Orders Placed This Encounter  Procedures  . Basic metabolic panel     Disposition:   FU with me and Dr. Excell Seltzer in one month.    Patient is agreeable to this plan and will call if any problems develop in the interim.   SignedNorma Fredrickson, NP  08/27/2016 3:50 PM  [facility] [address] [place], [place]  [address] Phone: [phone] Fax: [phone]

## 2016-08-27 ENCOUNTER — Ambulatory Visit (INDEPENDENT_AMBULATORY_CARE_PROVIDER_SITE_OTHER): Payer: Medicare Other | Admitting: Nurse Practitioner

## 2016-08-27 ENCOUNTER — Encounter: Payer: Self-pay | Admitting: Nurse Practitioner

## 2016-08-27 VITALS — BP 142/86 | HR 86 | Ht 62.5 in | Wt 129.6 lb

## 2016-08-27 DIAGNOSIS — I5023 Acute on chronic systolic (congestive) heart failure: Secondary | ICD-10-CM | POA: Diagnosis not present

## 2016-08-27 DIAGNOSIS — I5033 Acute on chronic diastolic (congestive) heart failure: Secondary | ICD-10-CM

## 2016-08-27 DIAGNOSIS — I350 Nonrheumatic aortic (valve) stenosis: Secondary | ICD-10-CM

## 2016-08-27 NOTE — Patient Instructions (Addendum)
We will be checking the following labs today - BMET   Medication Instructions:    Continue with your current medicines.   Ok to take an extra dose of your Lasix for weight gain of 2 pounds overnight.     Testing/Procedures To Be Arranged:  N/A  Follow-Up:   See me and Dr. Excell Seltzerooper in one month    Other Special Instructions:   N/A    If you need a refill on your cardiac medications before your next appointment, please call your pharmacy.   Call the [facility] office at [phone] if you have any questions, problems or concerns.

## 2016-08-28 ENCOUNTER — Telehealth: Payer: Self-pay | Admitting: Nurse Practitioner

## 2016-08-28 LAB — BASIC METABOLIC PANEL
BUN/Creatinine Ratio: 35 — ABNORMAL HIGH (ref 12–28)
BUN: 50 mg/dL — ABNORMAL HIGH (ref 10–36)
CO2: 22 mmol/L (ref 18–29)
Calcium: 8.9 mg/dL (ref 8.7–10.3)
Chloride: 98 mmol/L (ref 96–106)
Creatinine, Ser: 1.44 mg/dL — ABNORMAL HIGH (ref 0.57–1.00)
GFR calc Af Amer: 35 mL/min/{1.73_m2} — ABNORMAL LOW (ref >59)
GFR calc non Af Amer: 31 mL/min/{1.73_m2} — ABNORMAL LOW (ref >59)
Glucose: 96 mg/dL (ref 65–99)
Potassium: 4 mmol/L (ref 3.5–5.2)
Sodium: 138 mmol/L (ref 134–144)

## 2016-08-28 NOTE — Telephone Encounter (Signed)
F/u message ° °Pt returning RN call. Please call back to discuss  °

## 2016-08-28 NOTE — Progress Notes (Signed)
Pt aware of lab results 

## 2016-09-17 ENCOUNTER — Encounter: Payer: Self-pay | Admitting: Internal Medicine

## 2016-09-17 ENCOUNTER — Ambulatory Visit (INDEPENDENT_AMBULATORY_CARE_PROVIDER_SITE_OTHER): Payer: Medicare Other | Admitting: Internal Medicine

## 2016-09-17 VITALS — BP 126/86 | HR 55 | Ht 62.0 in | Wt 120.6 lb

## 2016-09-17 DIAGNOSIS — I442 Atrioventricular block, complete: Secondary | ICD-10-CM

## 2016-09-17 DIAGNOSIS — I480 Paroxysmal atrial fibrillation: Secondary | ICD-10-CM

## 2016-09-17 DIAGNOSIS — Z950 Presence of cardiac pacemaker: Secondary | ICD-10-CM

## 2016-09-17 NOTE — Patient Instructions (Addendum)
Medication Instructions: - Your physician recommends that you continue on your current medications as directed. Please refer to the Current Medication list given to you today.  Labwork: - none ordered  Procedures/Testing: - none ordered  Follow-Up: - Remote monitoring is used to monitor your Pacemaker of ICD from home. This monitoring reduces the number of office visits required to check your device to one time per year. It allows us to keep an eye on the functioning of your device to ensure it is working properly. You are scheduled for a device check from home on 12/17/16. You may send your transmission at any time that day. If you have a wireless device, the transmission will be sent automatically. After your physician reviews your transmission, you will receive a postcard with your next transmission date.  - Your physician wants you to follow-up in: 1 year with Francis Dowseenee Ursuy, PA for Dr. Graciela HusbandsKlein. You will receive a reminder letter in the mail two months in advance. If you don't receive a letter, please call our office to schedule the follow-up appointment.   Any Additional Special Instructions Will Be Listed Below (If Applicable).     If you need a refill on your cardiac medications before your next appointment, please call your pharmacy.

## 2016-09-17 NOTE — Progress Notes (Signed)
Patient Care Team: Jarome Matinaniel Paterson, MD as PCP - General (Internal Medicine)   HPI  Kara Cruz is a 83 y.o. female Seen in follow-up for pacemaker implanted for bradycardia i.e. 2-1 heart block in the setting of bifascicular block.   Overall she feels better.  She had some atrial fib earlier this year, but has been disinclined to use anticoagulation  Metoprolol was started and she has noted an decrease in energy and some increased breathlessness  She also has aortic stenosis  mean gradients are in the 40 range, however they decreased with pacing--33  Past Medical History:  Diagnosis Date  . Acute on chronic systolic heart failure (HCC)   . Age-related macular degeneration   . Aortic stenosis    a. moderate  . AV heart block    a. 09/2014 Medtronic MRI compatible dual chamber pacemaker.  . Frequent headaches   . Gout   . HTN (hypertension)   . Hypothyroidism   . Osteoarthritis   . Paroxysmal atrial fibrillation (HCC)    a. identified by device interrogation    Past Surgical History:  Procedure Laterality Date  . DILATATION & CURETTAGE/HYSTEROSCOPY WITH MYOSURE    . euflexa into knees    . eye injections    . PERMANENT PACEMAKER INSERTION N/A 09/14/2014   MDT MRI compatible dual chamber pacemaker implanted by [name]  . TONSILLECTOMY     1928    Current Outpatient Prescriptions  Medication Sig Dispense Refill  . allopurinol (ZYLOPRIM) 100 MG tablet Take 100 mg by mouth daily.    . calcium carbonate (TUMS EX) 750 MG chewable tablet Chew 2 tablets by mouth daily.    . cholecalciferol (VITAMIN D) 1000 UNITS tablet Take 1,000 Units by mouth daily.    Marland Kitchen. levothyroxine (SYNTHROID, LEVOTHROID) 150 MCG tablet Take 1 tablet (150 mcg total) by mouth daily before breakfast. 30 tablet 6  . losartan (COZAAR) 50 MG tablet Take by mouth daily as needed. TAKE 1 TAB BY MOUTH ONLY IF BP IS HIGH EARLY IN THE DAY 90 tablet 3  . Lutein 20 MG TABS Take 20 mg by mouth daily.       . meclizine (ANTIVERT) 25 MG tablet Take 25 mg by mouth daily as needed for dizziness (or vertigo).     . Multiple Vitamins-Minerals (PRESERVISION AREDS 2 PO) Take 1 capsule by mouth 2 (two) times daily.    . Polyvinyl Alcohol-Povidone (REFRESH OP) Place 1 drop into both eyes at bedtime.    . potassium chloride SA (K-DUR,KLOR-CON) 20 MEQ tablet Take 1 tablet (20 mEq total) by mouth daily. 30 tablet 6  . torsemide (DEMADEX) 20 MG tablet Take 1 tablet (20 mg total) by mouth daily. 60 tablet 3   No current facility-administered medications for this visit.     Allergies  Allergen Reactions  . Labetalol Swelling    Blisters around lips Swollen lips & eyes, blistering lips, dry mouth  . Prednisone Other (See Comments)    REACTION: SOB, difficulty swallowing  . Latex Itching  . Moxifloxacin Other (See Comments)    Doesn't remember  . Pseudoephedrine Hcl Other (See Comments)  . Codeine Sulfate Other (See Comments)    REACTION: "out of body experience"--"floats"  . Diphenhydramine Hcl Other (See Comments)    REACTION: headache, vertigo  . Doxycycline Hyclate Other (See Comments)    REACTION: nausea, vomiting  . Hibiclens [Chlorhexidine] Rash    wipes    Review of Systems negative except  from HPI and PMH  Physical Exam BP 126/86   Pulse (!) 55   Ht 5\' 2"  (1.575 m)   Wt 120 lb 9.6 oz (54.7 kg)   SpO2 99%   BMI 22.06 kg/m  Well developed and well nourished in no acute distress HENT normal E scleral and icterus clear Neck Supple JVP flat; carotids brisk and full Clear to ausculation  Device pocket well healed; without  hematoma or erythema.  There is no tethering  Regular rate and rhythm, 3/6 murmur  Soft with active bowel sounds No clubbing cyanosis  Edema Alert and oriented, grossly normal motor and sensory function Skin Warm and Dry  ECG demonstrates P synchronous pacing  Assessment and  Plan  High-grade heart block  Pacemaker-Medtronic  The patient's device was  interrogated.  The information was reviewed. No changes were made in the programming.     Atrial fibrillation paroxysmal  Aortic stenosis-moderate-severe  Hypertension    Blood pressure well controlled Surprisingly few symptoms. Ongoing episodes of atrial fibrillation Reviewing old notes they outlined that she has declined anticoagulation.

## 2016-09-18 LAB — CUP PACEART INCLINIC DEVICE CHECK
Battery Remaining Longevity: 87 mo
Brady Statistic AP VP Percent: 36.31 %
Brady Statistic AP VS Percent: 0.3 %
Brady Statistic AS VS Percent: 3.49 %
Brady Statistic RV Percent Paced: 89.07 %
Date Time Interrogation Session: 20180213203725
Implantable Lead Implant Date: 20160210
Implantable Lead Implant Date: 20160210
Implantable Lead Location: 753859
Implantable Lead Model: 5076
Lead Channel Impedance Value: 380 Ohm
Lead Channel Impedance Value: 456 Ohm
Lead Channel Impedance Value: 456 Ohm
Lead Channel Pacing Threshold Amplitude: 0.75 V
Lead Channel Sensing Intrinsic Amplitude: 1.625 mV
Lead Channel Sensing Intrinsic Amplitude: 6 mV
Lead Channel Sensing Intrinsic Amplitude: 7.625 mV
Lead Channel Setting Pacing Amplitude: 2 V
Lead Channel Setting Pacing Amplitude: 2.5 V
Lead Channel Setting Pacing Pulse Width: 0.4 ms
Lead Channel Setting Sensing Sensitivity: 2 mV
MDC IDC LEAD LOCATION: 753860
MDC IDC MSMT BATTERY VOLTAGE: 3.01 V
MDC IDC MSMT LEADCHNL RA IMPEDANCE VALUE: 342 Ohm
MDC IDC MSMT LEADCHNL RA PACING THRESHOLD AMPLITUDE: 0.75 V
MDC IDC MSMT LEADCHNL RA PACING THRESHOLD PULSEWIDTH: 0.4 ms
MDC IDC MSMT LEADCHNL RA SENSING INTR AMPL: 1.875 mV
MDC IDC MSMT LEADCHNL RV PACING THRESHOLD PULSEWIDTH: 0.4 ms
MDC IDC PG IMPLANT DT: 20160210
MDC IDC STAT BRADY AS VP PERCENT: 59.89 %
MDC IDC STAT BRADY RA PERCENT PACED: 33.82 %

## 2016-09-30 NOTE — Progress Notes (Signed)
CARDIOLOGY OFFICE NOTE  Date:  10/01/2016    Kara Cruz Date of Birth: [birth year] Medical Record #[id number]  PCP:  Garlan Fillers, MD  Cardiologist:  [facility]   Chief Complaint  Patient presents with  . Congestive Heart Failure    Follow up visit - seen for Dr. Verlon Au    History of Present Illness: Kara Cruz is a 83 y.o. female who presents today for a follow up visit. Seen for Dr. Excell Seltzer and Dr. Graciela Husbands.   She has a h/o severe stage D aortic stenosis, who recently refused intervention (TAVR offered). She also has chronic systolic HF with echo 04/03/16 showing an EF of 40-45%. She also has a PPM given h/o CHB that is followed by Dr. Graciela Husbands. She also has HTN.   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 Katrinka Blazing for issues with worsening LEE and weight gain. She also has exertional dyspnea but denies orthopnea and PND. No chest pain, syncope/ near syncope. She had beenadvised by Dr. Excell Seltzer to increase her Lasix to 40 mg daily, however she had no improvement with this. Her edema progressed to the level of her thighs. She basically failed on outpatient therapy and was admitted for IV diuresis.   She was admitted with plans for IV lasix starting with 40mg  q8hrs with parameters for her blood pressure with her hx of severe AS. Her TSH was noted at 13.4 this admission and synthroid was increased from 100 to . Her breathing improved with IV lasix and was net negative at total of 14L this admission. Weight at the time of discharge was 128lbs down from 150lbs at the time of admission. Her renal function was able to tolerate diuresis. She was seen by Dr. Jens Som on 12/26 and determined stable for discharge home. She was discharged on demadex 20mg  daily with additional 20mg  for weight gain of 2-3lbs.   I have seen her back a few times - she has done well. She has continued to opt for conservative management. We've been able to cut her potassium back some.   Saw  Dr. Graciela Husbands earlier this month - looks like that visit was ok.   Comes in today. Here with her "baby brother" again. She is doing well. Actually looks stronger. No chest pain. Breathing is fair for her. Swelling is stable. Weight is stable - actually down a little. She has had some vertigo for about a day - that tends to happen to her - now resolved. She is pleased with how she is doing and has no real concerns.   Past Medical History:  Diagnosis Date  . Acute on chronic systolic heart failure (HCC)   . Age-related macular degeneration   . Aortic stenosis    a. moderate  . AV heart block    a. 09/2014 Medtronic MRI compatible dual chamber pacemaker.  . Frequent headaches   . Gout   . HTN (hypertension)   . Hypothyroidism   . Osteoarthritis   . Paroxysmal atrial fibrillation (HCC)    a. identified by device interrogation    Past Surgical History:  Procedure Laterality Date  . DILATATION & CURETTAGE/HYSTEROSCOPY WITH MYOSURE    . euflexa into knees    . eye injections    . PERMANENT PACEMAKER INSERTION N/A 09/14/2014   MDT MRI compatible dual chamber pacemaker implanted by Dr Graciela Husbands  . TONSILLECTOMY     1928     Medications: Current Outpatient Prescriptions  Medication Sig Dispense  Refill  . allopurinol (ZYLOPRIM) 100 MG tablet Take 100 mg by mouth daily.    . calcium carbonate (TUMS EX) 750 MG chewable tablet Chew 2 tablets by mouth daily.    . cholecalciferol (VITAMIN D) 1000 UNITS tablet Take 1,000 Units by mouth daily.    Marland Kitchen levothyroxine (SYNTHROID, LEVOTHROID) 150 MCG tablet Take 1 tablet (150 mcg total) by mouth daily before breakfast. 30 tablet 6  . Lutein 20 MG TABS Take 20 mg by mouth daily.     . meclizine (ANTIVERT) 25 MG tablet Take 25 mg by mouth daily as needed for dizziness (or vertigo).     . Multiple Vitamins-Minerals (PRESERVISION AREDS 2 PO) Take 1 capsule by mouth 2 (two) times daily.    . Polyvinyl Alcohol-Povidone (REFRESH OP) Place 1 drop into both eyes at  bedtime.    . potassium chloride SA (K-DUR,KLOR-CON) 20 MEQ tablet Take 1 tablet (20 mEq total) by mouth daily. 30 tablet 6  . torsemide (DEMADEX) 20 MG tablet Take 1 tablet (20 mg total) by mouth daily. 60 tablet 3   No current facility-administered medications for this visit.     Allergies: Allergies  Allergen Reactions  . Labetalol Swelling    Blisters around lips Swollen lips & eyes, blistering lips, dry mouth  . Prednisone Other (See Comments)    REACTION: SOB, difficulty swallowing  . Latex Itching  . Moxifloxacin Other (See Comments)    Doesn't remember  . Pseudoephedrine Hcl Other (See Comments)  . Codeine Sulfate Other (See Comments)    REACTION: "out of body experience"--"floats"  . Diphenhydramine Hcl Other (See Comments)    REACTION: headache, vertigo  . Doxycycline Hyclate Other (See Comments)    REACTION: nausea, vomiting  . Hibiclens [Chlorhexidine] Rash    wipes    Social History: The patient  reports that she has quit smoking. She has never used smokeless tobacco. She reports that she does not drink alcohol or use drugs.   Family History: The patient's family history includes Arthritis in her brother and mother; Edema in her brother; Heart attack in her father; Heart disease in her brother and brother; Hypertension in her mother; Kidney failure in her brother; Prostate cancer in her brother; Stroke in her brother and mother.   Review of Systems: Please see the history of present illness.   Otherwise, the review of systems is positive for none.   All other systems are reviewed and negative.   Physical Exam: VS:  BP 140/68   Pulse 80   Ht 5' 2.5" (1.588 m)   Wt 122 lb 12.8 oz (55.7 kg)   SpO2 95% Comment: at rest  BMI 22.10 kg/m  .  BMI Body mass index is 22.1 kg/m.  Wt Readings from Last 3 Encounters:  10/01/16 122 lb 12.8 oz (55.7 kg)  09/17/16 120 lb 9.6 oz (54.7 kg)  08/27/16 129 lb 9.6 oz (58.8 kg)    General: Elderly female who is alert and  in no acute distress.   HEENT: Normal.  Neck: Supple, no JVD, carotid bruits, or masses noted.  Cardiac: Irregular irregular rhythm today. Rate is fine. Outflow murmur noted. Trace edema. Legs full. Respiratory:  Lungs are clear to auscultation bilaterally with normal work of breathing.  GI: Soft and nontender.  MS: No deformity or atrophy. Gait and ROM intact.  Skin: Warm and dry. Color is normal.  Neuro:  Strength and sensation are intact and no gross focal deficits noted.  Psych: Alert, appropriate and  with normal affect.   LABORATORY DATA:  EKG:  EKG is not ordered today.  Lab Results  Component Value Date   WBC 5.3 08/13/2016   HGB 12.0 07/23/2016   HCT 37.0 08/13/2016   PLT 212 08/13/2016   GLUCOSE 96 08/27/2016   ALT 28 11/23/2007   AST 35 11/23/2007   NA 138 08/27/2016   K 4.0 08/27/2016   CL 98 08/27/2016   CREATININE 1.44 (H) 08/27/2016   BUN 50 (H) 08/27/2016   CO2 22 08/27/2016   TSH 13.438 (H) 07/23/2016    BNP (last 3 results)  Recent Labs  11/03/15 1222  BNP 355.0*    ProBNP (last 3 results) No results for input(s): PROBNP in the last 8760 hours.   Other Studies Reviewed Today:  Echo Study Conclusions 03/2016  - Left ventricle: The cavity size was normal. There was moderate focal basal hypertrophy of the septum. Systolic function was mildly to moderately reduced. The estimated ejection fraction was in the range of 40% to 45%. Diffuse hypokinesis. - Ventricular septum: Septal motion showed abnormal function and dyssynergy. - Aortic valve: Valve mobility was restricted. There was moderate to severe stenosis. There was moderate regurgitation. Peak velocity (S): 397 cm/s. Mean gradient (S): 31 mm Hg. - Mitral valve: There was moderate regurgitation. - Left atrium: The atrium was severely dilated. - Tricuspid valve: There was mild-moderate regurgitation. - Pulmonic valve: There was moderate regurgitation. - Pulmonary arteries: PA  peak pressure: 63 mm Hg (S).  Impressions:  - LVEF may be underestimated due to frequent ectopy and LBBB. Unable to view images from 11/2015, but by report systolic function has worsened since that time.   Assessment/Plan:  1. Chronic systolic HF - weight is stable at home. She seems to be holding her own and actually looks better today. I have left her on her current regimen. Still with tenuous and guarded prognosis going forward.     2. CHB with underlying PPM in place - followed by EP - saw earlier this month.   3. Severe AS - has refused TAVR - no real cardinal symptoms noted.   4. HTN - BP fair. She has good control at home. No change with current regimen.   5. Hypothyroidism with recent increase in replacement therapy -Dr. Eloise HarmanPaterson has adjusted her medicine and will be following.   6. Advanced age  897. PAF - managed conservatively.   Current medicines are reviewed with the patient today.  The patient does not have concerns regarding medicines other than what has been noted above.  The following changes have been made:  See above.  Labs/ tests ordered today include:   No orders of the defined types were placed in this encounter.    Disposition:   FU with Dr. Excell Seltzerooper (brother is seeing Dr. Excell Seltzerooper in May - will get her seen then too) I will be happy to see back and continue to follow.   Patient is agreeable to this plan and will call if any problems develop in the interim.   SignedNorma Fredrickson: Mikkel Charrette, NP  10/01/2016 3:28 PM  [facility] [address] [place], [place]  [address] Phone: [phone] Fax: [phone]

## 2016-10-01 ENCOUNTER — Encounter: Payer: Self-pay | Admitting: Nurse Practitioner

## 2016-10-01 ENCOUNTER — Ambulatory Visit (INDEPENDENT_AMBULATORY_CARE_PROVIDER_SITE_OTHER): Payer: Medicare Other | Admitting: Nurse Practitioner

## 2016-10-01 VITALS — BP 140/68 | HR 80 | Ht 62.5 in | Wt 122.8 lb

## 2016-10-01 DIAGNOSIS — I10 Essential (primary) hypertension: Secondary | ICD-10-CM | POA: Diagnosis not present

## 2016-10-01 DIAGNOSIS — I350 Nonrheumatic aortic (valve) stenosis: Secondary | ICD-10-CM | POA: Diagnosis not present

## 2016-10-01 DIAGNOSIS — Z950 Presence of cardiac pacemaker: Secondary | ICD-10-CM

## 2016-10-01 DIAGNOSIS — R0602 Shortness of breath: Secondary | ICD-10-CM

## 2016-10-01 NOTE — Patient Instructions (Addendum)
We will be checking the following labs today - NONE   Medication Instructions:    Continue with your current medicines.     Testing/Procedures To Be Arranged:  N/A  Follow-Up:   See Dr. Excell Seltzerooper or me in May    Other Special Instructions:   N/A    If you need a refill on your cardiac medications before your next appointment, please call your pharmacy.   Call the [facility] office at [phone] if you have any questions, problems or concerns.

## 2016-12-17 ENCOUNTER — Encounter: Payer: Medicare Other | Admitting: *Deleted

## 2016-12-19 ENCOUNTER — Encounter: Payer: Self-pay | Admitting: Cardiology

## 2016-12-24 ENCOUNTER — Ambulatory Visit (INDEPENDENT_AMBULATORY_CARE_PROVIDER_SITE_OTHER): Payer: Medicare Other | Admitting: *Deleted

## 2016-12-24 DIAGNOSIS — I442 Atrioventricular block, complete: Secondary | ICD-10-CM

## 2016-12-24 NOTE — Progress Notes (Signed)
Remote pacemaker transmission.   

## 2016-12-25 ENCOUNTER — Encounter: Payer: Self-pay | Admitting: Cardiology

## 2016-12-25 ENCOUNTER — Ambulatory Visit (INDEPENDENT_AMBULATORY_CARE_PROVIDER_SITE_OTHER): Payer: Medicare Other | Admitting: Cardiovascular Disease

## 2016-12-25 ENCOUNTER — Encounter: Payer: Self-pay | Admitting: Cardiovascular Disease

## 2016-12-25 VITALS — BP 130/62 | HR 86 | Ht 62.5 in | Wt 123.6 lb

## 2016-12-25 DIAGNOSIS — I5023 Acute on chronic systolic (congestive) heart failure: Secondary | ICD-10-CM

## 2016-12-25 DIAGNOSIS — I350 Nonrheumatic aortic (valve) stenosis: Secondary | ICD-10-CM

## 2016-12-25 NOTE — Progress Notes (Signed)
Cardiology Office Note Date:  12/25/2016   ID:  Kara Cruz, DOB 04/10/1918, MRN [id number]  PCP:  Jarome MatinPaterson, Daniel, MD  Cardiologist:  Tonny Bollmanooper, Tonianne Fine, MD    Chief Complaint  Patient presents with  . Aortic Stenosis    follow up     History of Present Illness: Kara Codernne S Revell is a 83 y.o. female who presents for Follow-up of severe aortic stenosis. The patient has symptomatic aortic stenosis with progressive LV systolic dysfunction. She has declined TAVR. She was last seen in September 2017 by me, but has been seen several times in the office since then.  The patient is here alone today. Her daughter is also in the office but she is having an echocardiogram done. The patient has no resting symptoms. She does complain of progressive dyspnea with low-level exertion. She denies chest discomfort, lightheadedness, or syncope. Her chronic leg swelling is unchanged. She's been compliant with her medications.  Past Medical History:  Diagnosis Date  . Acute on chronic systolic heart failure (HCC)   . Age-related macular degeneration   . Aortic stenosis    a. moderate  . AV heart block    a. 09/2014 Medtronic MRI compatible dual chamber pacemaker.  . Frequent headaches   . Gout   . HTN (hypertension)   . Hypothyroidism   . Osteoarthritis   . Paroxysmal atrial fibrillation (HCC)    a. identified by device interrogation    Past Surgical History:  Procedure Laterality Date  . DILATATION & CURETTAGE/HYSTEROSCOPY WITH MYOSURE    . euflexa into knees    . eye injections    . PERMANENT PACEMAKER INSERTION N/A 09/14/2014   MDT MRI compatible dual chamber pacemaker implanted by [name]  . TONSILLECTOMY     1928    Current Outpatient Prescriptions  Medication Sig Dispense Refill  . calcium carbonate (TUMS EX) 750 MG chewable tablet Chew 2 tablets by mouth daily.    . cholecalciferol (VITAMIN D) 1000 UNITS tablet Take 1,000 Units by mouth daily.    Marland Kitchen. levothyroxine (SYNTHROID,  LEVOTHROID) 150 MCG tablet Take 1 tablet (150 mcg total) by mouth daily before breakfast. 30 tablet 6  . Lutein 20 MG TABS Take 20 mg by mouth daily.     . meclizine (ANTIVERT) 25 MG tablet Take 25 mg by mouth daily as needed for dizziness (or vertigo).     . Polyvinyl Alcohol-Povidone (REFRESH OP) Place 1 drop into both eyes at bedtime.    . potassium chloride SA (K-DUR,KLOR-CON) 20 MEQ tablet Take 1 tablet (20 mEq total) by mouth daily. 30 tablet 6  . torsemide (DEMADEX) 20 MG tablet Take 1 tablet (20 mg total) by mouth daily. 60 tablet 3   No current facility-administered medications for this visit.     Allergies:   Labetalol; Prednisone; Latex; Moxifloxacin; Pseudoephedrine hcl; Codeine sulfate; Diphenhydramine hcl; Doxycycline hyclate; and Hibiclens [chlorhexidine]   Social History:  The patient  reports that she has quit smoking. She has never used smokeless tobacco. She reports that she does not drink alcohol or use drugs.   Family History:  The patient's  family history includes Arthritis in her brother and mother; Edema in her brother; Heart attack in her father; Heart disease in her brother and brother; Hypertension in her mother; Kidney failure in her brother; Prostate cancer in her brother; Stroke in her brother and mother.    ROS:  Please see the history of present illness.  Otherwise, review of systems is positive  for Weight loss, macular degeneration with visual changes, balance problems, and excessive fatigue.  All other systems are reviewed and negative.    PHYSICAL EXAM: VS:  BP 130/62   Pulse 86   Ht 5' 2.5" (1.588 m)   Wt 123 lb 9.6 oz (56.1 kg)   BMI 22.25 kg/m  , BMI Body mass index is 22.25 kg/m. GEN: Thin elderly woman, in no acute distress  HEENT: normal  Neck: no JVD, no masses.  Cardiac: Irregular with grade 3/6 harsh systolic murmur at the left sternal border           Respiratory:  clear to auscultation bilaterally, normal work of breathing GI: soft,  nontender, nondistended, + BS MS: no deformity or atrophy  Ext: 1+ bilateral pretibial edema Skin: warm and dry, no rash Neuro:  Strength and sensation are intact Psych: euthymic mood, full affect  EKG:  EKG is not ordered today.  Recent Labs: 07/23/2016: Hemoglobin 12.0; TSH 13.438 08/13/2016: Platelets 212 08/27/2016: BUN 50; Creatinine, Ser 1.44; Potassium 4.0; Sodium 138   Lipid Panel  No results found for: CHOL, TRIG, HDL, CHOLHDL, VLDL, LDLCALC, LDLDIRECT    Wt Readings from Last 3 Encounters:  12/25/16 123 lb 9.6 oz (56.1 kg)  10/01/16 122 lb 12.8 oz (55.7 kg)  09/17/16 120 lb 9.6 oz (54.7 kg)     Cardiac Studies Reviewed: 2D Echo 04-03-2016: Study Conclusions  - Left ventricle: The cavity size was normal. There was moderate   focal basal hypertrophy of the septum. Systolic function was   mildly to moderately reduced. The estimated ejection fraction was   in the range of 40% to 45%. Diffuse hypokinesis. - Ventricular septum: Septal motion showed abnormal function and   dyssynergy. - Aortic valve: Valve mobility was restricted. There was moderate   to severe stenosis. There was moderate regurgitation. Peak   velocity (S): 397 cm/s. Mean gradient (S): 31 mm Hg. - Mitral valve: There was moderate regurgitation. - Left atrium: The atrium was severely dilated. - Tricuspid valve: There was mild-moderate regurgitation. - Pulmonic valve: There was moderate regurgitation. - Pulmonary arteries: PA peak pressure: 63 mm Hg (S).  Impressions:  - LVEF may be underestimated due to frequent ectopy and LBBB.   Unable to view images from 11/2015, but by report systolic   function has worsened since that time.  ASSESSMENT AND PLAN: 1.  Severe, stage D aortic stenosis 2. Chronic combined systolic and diastolic heart failure, New York Heart Association functional class III 3. Paroxysmal atrial fibrillation 4. Complete heart block status post permanent pacemaker  The patient has  progressive functional limitation related to heart failure and aortic stenosis. Based on her wishes we are treating her with palliative medical therapy. I did not change her regimen today. She understands the natural history of aortic stenosis with an expectation for progressive symptoms. I will arrange follow-up with Norma Fredrickson in 3 months on a day I'm in the office. Fortunately she is having no resting symptoms at this time.  Current medicines are reviewed with the patient today.  The patient does not have concerns regarding medicines.  Labs/ tests ordered today include:  No orders of the defined types were placed in this encounter.   Disposition:   FU 3 months with Norma Fredrickson, NP.   Enzo Bi, MD  12/25/2016 5:52 PM    [facility] [address] [place], [place], [place]  [address] Phone: [phone]; Fax: [phone]

## 2016-12-25 NOTE — Patient Instructions (Signed)
Medication Instructions:  Your physician recommends that you continue on your current medications as directed. Please refer to the Current Medication list given to you today.  Labwork: No new orders.   Testing/Procedures: No new orders.   Follow-Up: Your physician recommends that you schedule a follow-up appointment in: 3 MONTHS with Lori Gerhardt NP   Any Other Special Instructions Will Be Listed Below (If Applicable).     If you need a refill on your cardiac medications before your next appointment, please call your pharmacy.   

## 2016-12-26 LAB — CUP PACEART REMOTE DEVICE CHECK
Battery Remaining Longevity: 79 mo
Brady Statistic AP VS Percent: 0.04 %
Brady Statistic RA Percent Paced: 26.41 %
Brady Statistic RV Percent Paced: 92.39 %
Date Time Interrogation Session: 20180522124627
Implantable Lead Implant Date: 20160210
Implantable Lead Location: 753860
Implantable Lead Model: 5076
Lead Channel Impedance Value: 437 Ohm
Lead Channel Pacing Threshold Pulse Width: 0.4 ms
Lead Channel Pacing Threshold Pulse Width: 0.4 ms
Lead Channel Sensing Intrinsic Amplitude: 0.5 mV
Lead Channel Sensing Intrinsic Amplitude: 6.125 mV
Lead Channel Sensing Intrinsic Amplitude: 6.125 mV
Lead Channel Setting Pacing Amplitude: 2.5 V
Lead Channel Setting Pacing Pulse Width: 0.4 ms
Lead Channel Setting Sensing Sensitivity: 2 mV
MDC IDC LEAD IMPLANT DT: 20160210
MDC IDC LEAD LOCATION: 753859
MDC IDC MSMT BATTERY VOLTAGE: 3 V
MDC IDC MSMT LEADCHNL RA IMPEDANCE VALUE: 323 Ohm
MDC IDC MSMT LEADCHNL RA IMPEDANCE VALUE: 437 Ohm
MDC IDC MSMT LEADCHNL RA PACING THRESHOLD AMPLITUDE: 0.875 V
MDC IDC MSMT LEADCHNL RA SENSING INTR AMPL: 0.5 mV
MDC IDC MSMT LEADCHNL RV IMPEDANCE VALUE: 342 Ohm
MDC IDC MSMT LEADCHNL RV PACING THRESHOLD AMPLITUDE: 0.625 V
MDC IDC PG IMPLANT DT: 20160210
MDC IDC SET LEADCHNL RA PACING AMPLITUDE: 2 V
MDC IDC STAT BRADY AP VP PERCENT: 28.42 %
MDC IDC STAT BRADY AS VP PERCENT: 67.94 %
MDC IDC STAT BRADY AS VS PERCENT: 3.6 %

## 2016-12-31 ENCOUNTER — Ambulatory Visit: Payer: Medicare Other | Admitting: Sports Medicine

## 2017-02-06 ENCOUNTER — Telehealth: Payer: Self-pay | Admitting: Cardiovascular Disease

## 2017-02-06 NOTE — Telephone Encounter (Signed)
I spoke with the pt's daughter Britta MccreedyBarbara and she spoke with the pt and they agree with referral for palliative care.  [facility] and was transferred to the voicemail of Corning Incorporatedmber Poole (referral coordinator) and left a voicemail for her to contact the office to discuss patient.

## 2017-02-06 NOTE — Telephone Encounter (Signed)
Pt c/o Shortness Of Breath: STAT if SOB developed within the last 24 hours or pt is noticeably SOB on the phone  1. Are you currently SOB (can you hear that pt is SOB on the phone)? yes  2. How long have you been experiencing SOB? Week or 10 days   3. Are you SOB when sitting or when up moving around? both  4. Are you currently experiencing any other symptoms? no

## 2017-02-06 NOTE — Telephone Encounter (Signed)
I spoke with the pt and she is anxious on the phone and gasping for air. The pt's SOB worsened about 10 days ago but she has been putting off calling the office and she refuses to go to the ER.  She would like to know if we can call in a medication to the pharmacy to help her breath better.  I made her aware that the worsening in SOB is related to the progression of aortic stenosis and that we cannot just prescribe a medication to treat her symptoms. I advised her that Dr Excell Seltzerooper recommends Palliative Care evaluation.  The pt then placed her daughter on the line.  I made her aware of Dr Earmon Phoenixooper's recommendation and she asked that I call them back in 15-20 minutes so that she can discuss this with her mother.

## 2017-02-06 NOTE — Telephone Encounter (Signed)
Pt c/o Shortness Of Breath: STAT if SOB developed within the last 24 hours or pt is noticeably SOB on the phone  1. Are you currently SOB (can you hear that pt is SOB on the phone)?yes   2. How long have you been experiencing SOB? 1week and has gotten worse   3. Are you SOB when sitting or when up moving around? Sitting and moving .   4. Are you currently experiencing any other symptoms? no

## 2017-02-06 NOTE — Telephone Encounter (Addendum)
Received call. Pt reports SOB "ever since we learned about the valve problem" She's reporting progressive worsening of her shortness of breath x1-2 weeks. She reports no chest pain, lightheadedness, fatigue. States dyspnea is occurrent at rest as well as with activity. She reports no increase in weight or lower extremity swelling.  Pt requesting appt. She refused the option of ER for evaluation. States she would like to see Dr. Excell Seltzerooper or Norma FredricksonLori Gerhardt only.  I have checked schedule and do not see upcoming openings for providers (there are on-hold slots for both). [facility]on/work-in.

## 2017-02-07 NOTE — Telephone Encounter (Signed)
I spoke with Kara Cruz in referrals with Kara Cruz and information given for Palliative Care referral per Dr Excell Seltzerooper.

## 2017-02-12 ENCOUNTER — Telehealth: Payer: Self-pay | Admitting: Cardiovascular Disease

## 2017-02-12 NOTE — Telephone Encounter (Signed)
I spoke with Kathie RhodesBetty and she said the NP Synetta Failnita saw the pt yesterday and felt like Hospice was a better plan than palliative care.  I advised her that this is okay with Dr Excell Seltzerooper and she will forward an order to our office tomorrow to address who will be signing orders.

## 2017-02-12 NOTE — Telephone Encounter (Signed)
Left message on machine for Synetta Failnita to contact the office.

## 2017-02-12 NOTE — Telephone Encounter (Signed)
New message    Kara Cruz is calling because she verbalized that pt qualifies for   hospice care and could help pt 02 stats currently at 88% of room air   Or pt will need an order for oxygen   Fax number [phone]

## 2017-02-12 NOTE — Telephone Encounter (Signed)
New message    Kara RhodesBetty is calling about pt and being on hospice. Please call.

## 2017-02-21 NOTE — Telephone Encounter (Signed)
Reviewed with Dr. Okey DupreEnd who recommends pt increase torsemide to 40 mg in the AM and 20 mg in the PM over the weekend and keep legs elevated as much as possible.  I spoke with Mardella LaymanLindsey and gave her this information.  Will forward to Dr.Cooper for review when he returns to office next week.

## 2017-02-21 NOTE — Telephone Encounter (Signed)
I spoke with pt's Hospice nurse Mardella LaymanLindsey.  Pt was admitted to hospice this week. Family is concerned about lower extremity swelling. Pt has been on torsemide 20 mg daily and had been instructed to increase to BID if she had increased swelling. She started BID dosing on 7/16 but swelling has not improved.  Pt does not weigh daily. Mardella LaymanLindsey reports shortness of breath seems to be the same. Pt is on home oxygen as needed.  Oxygen sat was 96% on room air yesterday. Had abdominal swelling earlier this week but this is slightly improved today.  Will review with DOD

## 2017-02-21 NOTE — Telephone Encounter (Signed)
New Message     Pt c/o swelling: STAT is pt has developed SOB within 24 hours  1. How long have you been experiencing swelling? Before she was admitted to hospice   2. Where is the swelling located?  Lower legs swelling   3.  Are you currently taking a "fluid pill"?yes   4.  Are you currently SOB? Severe all the time -she is using oxygen  5.  Have you traveled recently? No  The increase in the  torsemide (DEMADEX) 20 MG tablet Take 1 tablet (20 mg total) by mouth daily.   Twice a day is not helping they have seen no difference in the swelling.

## 2017-02-24 NOTE — Telephone Encounter (Signed)
Agree with plan and ok to stay on this dose of of torsemide if needed

## 2017-02-26 MED ORDER — TORSEMIDE 20 MG PO TABS: ORAL_TABLET | ORAL | 3 refills | Status: DC

## 2017-02-26 NOTE — Telephone Encounter (Signed)
F/U Call:  Kara Cruz calling with hospice, she would like an update on the matter.  Kara Cruz also states that patient did take the extra dose torsemide and her edema is unchanged and is still significant.

## 2017-02-26 NOTE — Telephone Encounter (Signed)
I spoke with Mardella LaymanLindsey and made her aware that Dr Excell Seltzerooper did review the information from last week in regards to increased dosage of Torsemide.  Per Dr Excell Seltzerooper the pt can continue this dosage if needed.  Per Mardella LaymanLindsey the pt still has edema with increased dosage over the weekend.  At this time the pt will go to the higher dosage of Torsemide 40mg  in the morning and 20mg  in the evening as her maintenance dosage.  Mardella LaymanLindsey will follow-up with the pt and daughter in regards to these instructions and Hospice will continue to follow the pt and notify our office with any additional questions or concerns. Medication list updated.

## 2017-03-05 ENCOUNTER — Telehealth: Payer: Self-pay | Admitting: Cardiovascular Disease

## 2017-03-05 NOTE — Telephone Encounter (Signed)
New message    [facility].   Pt c/o swelling: STAT is pt has developed SOB within 24 hours  1. How long have you been experiencing swelling? A few weeks  2. Where is the swelling located? Lower extremeties  3.  Are you currently taking a "fluid pill"? Does was increased last week. Pt has also had 4 lb weight gain in the last week.   4.  Are you currently SOB? Yes-all the night, nothing new.  5.  Have you traveled recently? No

## 2017-03-05 NOTE — Telephone Encounter (Signed)
Per record the pt should be taking Torsemide 40mg  in the morning and 20mg  in the evening. I attempted to reach Kara Cruz by phone but her mailbox is full and will not accept any messages. I will try to reach her tomorrow.

## 2017-03-06 NOTE — Telephone Encounter (Signed)
I spoke with the Hospice nurse Lillia AbedLindsay and she has been following the pt.  Per Lillia AbedLindsay the pt continues to have swelling, weight gain and SOB.  The pt's abdomen and right leg swelling seems to be slightly improved but the left leg remains very swollen. The pt is SOB but her O2 sats are 96-97% and the pt is rarely using oxygen. Per Lillia AbedLindsay the pt does gasp for air and this seems like her baseline but when Crowley[place]say monitors the pt for 30 minutes the pt does have periods of normal breathing. Lungs are clear and the pt is urinating frequently due to increased Torsemide. Per Lillia AbedLindsay the pt's weight has increased and the daughter feels like this is related to the pt's increased appetite and food intake.  Weight in July stayed consistent around 120 lbs then increased to 126 and 128. Yesterday the pt's weight was 132 lbs.  The pt has a pending appointment in our office 03/10/17 but Lillia AbedLindsay wanted to give an update and see if any additional orders need to be given.

## 2017-03-07 MED ORDER — TORSEMIDE 20 MG PO TABS: ORAL_TABLET | ORAL | 3 refills | Status: DC

## 2017-03-07 NOTE — Telephone Encounter (Signed)
I spoke with Kara Cruz and made her aware of Dr Earmon Phoenixooper's recommendation. Kara Cruz will contact the pt's daughter with these instructions.

## 2017-03-07 NOTE — Telephone Encounter (Signed)
Would increase torsemide to 40 mg BID until she is seen 8/6 thanks

## 2017-03-10 ENCOUNTER — Ambulatory Visit (INDEPENDENT_AMBULATORY_CARE_PROVIDER_SITE_OTHER): Admitting: Nurse Practitioner

## 2017-03-10 ENCOUNTER — Ambulatory Visit: Payer: Medicare Other | Admitting: Nurse Practitioner

## 2017-03-10 VITALS — BP 118/60 | HR 87 | Wt 125.0 lb

## 2017-03-10 DIAGNOSIS — R0602 Shortness of breath: Secondary | ICD-10-CM | POA: Diagnosis not present

## 2017-03-10 DIAGNOSIS — I350 Nonrheumatic aortic (valve) stenosis: Secondary | ICD-10-CM | POA: Diagnosis not present

## 2017-03-10 MED ORDER — TORSEMIDE 20 MG PO TABS: 40.0000 mg | ORAL_TABLET | Freq: Two times a day (BID) | ORAL | 6 refills | Status: DC

## 2017-03-10 NOTE — Progress Notes (Signed)
CARDIOLOGY OFFICE NOTE  Date:  03/10/2017    Kara Cruz Date of Birth: 26-Nov-1917 Medical Record #[id number]  PCP:  Jarome Matin, MD  Cardiologist:  Kirt Boys  Chief Complaint  Patient presents with  . Aortic Stenosis    Follow up visit - seen for Dr. Excell Seltzer    History of Present Illness: Kara Cruz is a 83 y.o. female who presents today for a follow up visit. Seen for Dr. Excell Seltzer.   She has severe aortic stenosis. The patient has symptomatic severe stage D aortic stenosis with progressive LV systolic dysfunction. She has declined TAVR. She was last seen in May of 2018 by Dr. Excell Seltzer.  She also has a PPM given h/o CHB that is followed by Dr. Graciela Husbands. She also has HTN and PAF   Comes in today. Here with her sister. She is turning 89 later this week. She says she is doing ok. Clearly declining though. More swelling in her legs recently - torsemide was increased just this past weekend. This has helped. Not really any more short of breath but short of breath with minimal activity/even talking. More bloating in her belly when she wears her oxygen. She says she is "ready to go". She has a sensation of her mother and husband are around her. Some intermittent pain in the right chest. Overall, seems ok.   Past Medical History:  Diagnosis Date  . Acute on chronic systolic heart failure (HCC)   . Age-related macular degeneration   . Aortic stenosis    a. moderate  . AV heart block    a. 09/2014 Medtronic MRI compatible dual chamber pacemaker.  . Frequent headaches   . Gout   . HTN (hypertension)   . Hypothyroidism   . Osteoarthritis   . Paroxysmal atrial fibrillation (HCC)    a. identified by device interrogation    Past Surgical History:  Procedure Laterality Date  . DILATATION & CURETTAGE/HYSTEROSCOPY WITH MYOSURE    . euflexa into knees    . eye injections    . PERMANENT PACEMAKER INSERTION N/A 09/14/2014   MDT MRI compatible dual chamber pacemaker implanted  by Dr Graciela Husbands  . TONSILLECTOMY     1928     Medications: Current Meds  Medication Sig  . calcium carbonate (TUMS EX) 750 MG chewable tablet Chew 2 tablets by mouth daily.  . cholecalciferol (VITAMIN D) 1000 UNITS tablet Take 1,000 Units by mouth daily.  Marland Kitchen levothyroxine (SYNTHROID, LEVOTHROID) 150 MCG tablet Take 1 tablet (150 mcg total) by mouth daily before breakfast.  . Lutein 20 MG TABS Take 20 mg by mouth daily.   . meclizine (ANTIVERT) 25 MG tablet Take 25 mg by mouth daily as needed for dizziness (or vertigo).   . Multiple Vitamins-Minerals (PRESERVISION AREDS 2) CAPS Take 2 tablets by mouth daily.  . Polyvinyl Alcohol-Povidone (REFRESH OP) Place 1 drop into both eyes at bedtime.  . Potassium 99 MG TABS Take 198 mg by mouth daily.  Marland Kitchen torsemide (DEMADEX) 20 MG tablet Take 40 mg by mouth daily. (40 mg ) in am (20 mg ) pm.  . [DISCONTINUED] torsemide (DEMADEX) 20 MG tablet Take 2 tablets by mouth every morning and 2 tablet by mouth every evening     Allergies: Allergies  Allergen Reactions  . Labetalol Swelling    Blisters around lips Swollen lips & eyes, blistering lips, dry mouth  . Prednisone Other (See Comments)    REACTION: SOB, difficulty swallowing  .  Latex Itching  . Moxifloxacin Other (See Comments)    Doesn't remember  . Pseudoephedrine Hcl Other (See Comments)  . Codeine Sulfate Other (See Comments)    REACTION: "out of body experience"--"floats"  . Diphenhydramine Hcl Other (See Comments)    REACTION: headache, vertigo  . Doxycycline Hyclate Other (See Comments)    REACTION: nausea, vomiting  . Hibiclens [Chlorhexidine] Rash    wipes    Social History: The patient  reports that she has quit smoking. She has never used smokeless tobacco. She reports that she does not drink alcohol or use drugs.   Family History: The patient's family history includes Arthritis in her brother and mother; Edema in her brother; Heart attack in her father; Heart disease in her  brother and brother; Hypertension in her mother; Kidney failure in her brother; Prostate cancer in her brother; Stroke in her brother and mother.   Review of Systems: Please see the history of present illness.   Otherwise, the review of systems is positive for none.   All other systems are reviewed and negative.   Physical Exam: VS:  BP 118/60   Pulse 87   Wt 125 lb (56.7 kg)   SpO2 93%   BMI 22.50 kg/m  .  BMI Body mass index is 22.5 kg/m.  Wt Readings from Last 3 Encounters:  03/10/17 125 lb (56.7 kg)  12/25/16 123 lb 9.6 oz (56.1 kg)  10/01/16 122 lb 12.8 oz (55.7 kg)    General: Elderly female. Chronically ill. Short of breath with minimal activity. She is alert and in no acute distress.   HEENT: Normal.  Neck: Supple, no JVD, carotid bruits, or masses noted.  Cardiac: More irregular rhythm today. Legs are swollen. Heart sounds distant.Respiratory:  Lungs are clear to auscultation bilaterally with increased work of breathing.  GI: Soft and nontender.  MS: No deformity or atrophy. Gait not tested Skin: Warm and dry. Color is normal.  Neuro:  Strength and sensation are intact and no gross focal deficits noted.  Psych: Alert, appropriate and with normal affect.   LABORATORY DATA:  EKG:  EKG is not ordered today.  Lab Results  Component Value Date   WBC 5.3 08/13/2016   HGB 11.8 08/13/2016   HCT 37.0 08/13/2016   PLT 212 08/13/2016   GLUCOSE 96 08/27/2016   ALT 28 11/23/2007   AST 35 11/23/2007   NA 138 08/27/2016   K 4.0 08/27/2016   CL 98 08/27/2016   CREATININE 1.44 (H) 08/27/2016   BUN 50 (H) 08/27/2016   CO2 22 08/27/2016   TSH 13.438 (H) 07/23/2016     BNP (last 3 results) No results for input(s): BNP in the last 8760 hours.  ProBNP (last 3 results) No results for input(s): PROBNP in the last 8760 hours.   Other Studies Reviewed Today:  2D Echo 04-03-2016: Study Conclusions  - Left ventricle: The cavity size was normal. There was  moderate focal basal hypertrophy of the septum. Systolic function was mildly to moderately reduced. The estimated ejection fraction was in the range of 40% to 45%. Diffuse hypokinesis. - Ventricular septum: Septal motion showed abnormal function and dyssynergy. - Aortic valve: Valve mobility was restricted. There was moderate to severe stenosis. There was moderate regurgitation. Peak velocity (S): 397 cm/s. Mean gradient (S): 31 mm Hg. - Mitral valve: There was moderate regurgitation. - Left atrium: The atrium was severely dilated. - Tricuspid valve: There was mild-moderate regurgitation. - Pulmonic valve: There was moderate regurgitation. -  Pulmonary arteries: PA peak pressure: 63 mm Hg (S).  Impressions:  - LVEF may be underestimated due to frequent ectopy and LBBB. Unable to view images from 11/2015, but by report systolic function has worsened since that time.  ASSESSMENT AND PLAN:  1.  Severe, stage D aortic stenosis - comfort care - she is failing. Will leave her on the increased dose of diuretic. Will let home health RN continue to correspond with [place]. She is "ready to meet God" as asked earlier today by Dr. Eloise Harman.   2. Chronic combined systolic and diastolic heart failure, New York Heart Association functional class III/IV now - conservative management. She is ready to die.  3. Paroxysmal atrial fibrillation - sounds like she may be in AF today - rate ok.   4. Complete heart block status post permanent pacemaker  The patient has progressive functional limitation related to heart failure and aortic stenosis. Based on her wishes we are treating her with palliative medical therapy. I am leaving her on the increased dose of diuretic - will try to keep more "dried out" rather than short of breath/edematous. She understands the natural history of aortic stenosis with an expectation for progressive symptoms. Will see back prn and let home health nurse be a liaison  for her care. Seen by Dr. Excell Seltzer today as well.   Current medicines are reviewed with the patient today.  The patient does not have concerns regarding medicines other than what has been noted above.  The following changes have been made:  See above.  Labs/ tests ordered today include:   No orders of the defined types were placed in this encounter.    Disposition:   See above.    Patient is agreeable to this plan and will call if any problems develop in the interim.   SignedNorma Fredrickson, NP  03/10/2017 3:20 PM  [facility] [address] [place], [place]  [address] Phone: [phone] Fax: [phone]

## 2017-03-10 NOTE — Patient Instructions (Addendum)
We will be checking the following labs today - NONE   Medication Instructions:    Continue with your current medicines.     Testing/Procedures To Be Arranged:  N/A  Follow-Up:   Will let Mardella LaymanLindsey be in touch with us by phone.      Other Special Instructions:   Happy Birthday!    If you need a refill on your cardiac medications before your next appointment, please call your pharmacy.   Call the [facility] office at [phone] if you have any questions, problems or concerns.

## 2017-03-25 ENCOUNTER — Ambulatory Visit (INDEPENDENT_AMBULATORY_CARE_PROVIDER_SITE_OTHER): Admitting: *Deleted

## 2017-03-25 DIAGNOSIS — I442 Atrioventricular block, complete: Secondary | ICD-10-CM | POA: Diagnosis not present

## 2017-03-26 LAB — CUP PACEART REMOTE DEVICE CHECK
Battery Remaining Longevity: 71 mo
Brady Statistic AP VS Percent: 0.05 %
Brady Statistic AS VP Percent: 75.44 %
Brady Statistic RA Percent Paced: 20.09 %
Brady Statistic RV Percent Paced: 95.41 %
Date Time Interrogation Session: 20180821130936
Implantable Lead Implant Date: 20160210
Implantable Lead Location: 753859
Implantable Lead Location: 753860
Implantable Lead Model: 5076
Implantable Pulse Generator Implant Date: 20160210
Lead Channel Impedance Value: 437 Ohm
Lead Channel Pacing Threshold Pulse Width: 0.4 ms
Lead Channel Sensing Intrinsic Amplitude: 1 mV
Lead Channel Sensing Intrinsic Amplitude: 1 mV
Lead Channel Sensing Intrinsic Amplitude: 7.625 mV
Lead Channel Sensing Intrinsic Amplitude: 7.625 mV
Lead Channel Setting Pacing Amplitude: 2.5 V
Lead Channel Setting Pacing Pulse Width: 0.4 ms
Lead Channel Setting Sensing Sensitivity: 2 mV
MDC IDC LEAD IMPLANT DT: 20160210
MDC IDC MSMT BATTERY VOLTAGE: 3 V
MDC IDC MSMT LEADCHNL RA IMPEDANCE VALUE: 323 Ohm
MDC IDC MSMT LEADCHNL RA IMPEDANCE VALUE: 494 Ohm
MDC IDC MSMT LEADCHNL RA PACING THRESHOLD AMPLITUDE: 0.875 V
MDC IDC MSMT LEADCHNL RV IMPEDANCE VALUE: 342 Ohm
MDC IDC MSMT LEADCHNL RV PACING THRESHOLD AMPLITUDE: 0.75 V
MDC IDC MSMT LEADCHNL RV PACING THRESHOLD PULSEWIDTH: 0.4 ms
MDC IDC SET LEADCHNL RA PACING AMPLITUDE: 2 V
MDC IDC STAT BRADY AP VP PERCENT: 21.35 %
MDC IDC STAT BRADY AS VS PERCENT: 3.15 %

## 2017-03-26 NOTE — Progress Notes (Signed)
Remote pacemaker transmission.   

## 2017-04-04 ENCOUNTER — Encounter: Payer: Self-pay | Admitting: Cardiology

## 2017-04-09 ENCOUNTER — Telehealth: Payer: Self-pay | Admitting: Nurse Practitioner

## 2017-04-09 NOTE — Telephone Encounter (Signed)
I have spoken to Kara Cruz the nurse with Hospice - she has been with the patient for the last hour. She notes a considerable decline. Ms. Kara Cruz is very short of breath and tachypneic - rate 40 to 50's. Patient is asking if she can take an additional aspirin to "feel better". Explained that I have no problem with her taking an extra aspirin but I do not feel this will be beneficial for her symptom relief. Have advised starting low dose Morphine -can be dosed by the house MD for hospice.

## 2017-04-09 NOTE — Telephone Encounter (Signed)
Kara AbedLindsay [facility] is calling about Kara Cruz. She is having severe shortness of breath and this is day 4. Please call wants to speak with Kara FiscalLori about Symptom Management.

## 2017-04-24 ENCOUNTER — Telehealth: Payer: Self-pay | Admitting: Cardiovascular Disease

## 2017-04-24 NOTE — Telephone Encounter (Signed)
New Message  Lillia Abed call requesting to speak with RN. She states pt low extremity has gotten worse. She states it is swelling and is concerning to the patient. Lillia Abed would like to know if there is anything Dr. Excell Seltzer would like them to do for the patient. Please call back to discuss

## 2017-04-24 NOTE — Telephone Encounter (Signed)
Left message for Lindsay to call back.  

## 2017-04-30 ENCOUNTER — Telehealth: Payer: Self-pay | Admitting: Cardiovascular Disease

## 2017-04-30 NOTE — Telephone Encounter (Signed)
Please call Mardella Layman the nurse with Hospice. Ok to increase the Demadex to 60 mg in the AM and 40 mg in the PM (was on 40 mg BID) and otherwise continue with her current plan of care. Sounds like she is fairly comfortable overall.

## 2017-04-30 NOTE — Telephone Encounter (Signed)
Lillia Abed, RN called to give an update on how the patient is doing.  She states she is "pretty much the same as when she saw Lawson Fiscal last." She takes her Morphine at bedtime and these helps her sleep and with her breathing. Her BP, HR and O2 sats are fine (and the patient does not use her O2 very much). Her respirations are usually 30-40 (this is not new) and she has edema 3-4+ in her legs. She has been taking torsemide as directed.  She states she is eating well (although smaller portions) and watching her salt. She elevates her legs during the day.  Informed Lillia Abed that Lawson Fiscal would be updated, and that Lillia Abed would be called if there are any new recommendations regarding medications. She understands to call if symptoms worsen.  She was grateful for call.

## 2017-04-30 NOTE — Telephone Encounter (Signed)
See 9/26 phone note.

## 2017-04-30 NOTE — Telephone Encounter (Signed)
New message      Talk to the nurse about LE edema.  Please call

## 2017-05-01 NOTE — Telephone Encounter (Signed)
Left message to call back  

## 2017-05-02 MED ORDER — TORSEMIDE 20 MG PO TABS: ORAL_TABLET | ORAL | 11 refills | Status: DC

## 2017-05-02 NOTE — Telephone Encounter (Signed)
Instructed Kara Cruz to have patient INCREASE DEMADEX to 60 mg in the AM and 40 mg in the PM. She understands to call if symptoms worsen. She was grateful for call and agrees with treatment plan.

## 2017-06-24 ENCOUNTER — Ambulatory Visit (INDEPENDENT_AMBULATORY_CARE_PROVIDER_SITE_OTHER): Admitting: *Deleted

## 2017-06-24 DIAGNOSIS — I442 Atrioventricular block, complete: Secondary | ICD-10-CM

## 2017-06-25 LAB — CUP PACEART REMOTE DEVICE CHECK
Battery Remaining Longevity: 71 mo
Battery Voltage: 3 V
Brady Statistic AS VS Percent: 2.31 %
Brady Statistic RA Percent Paced: 38.02 %
Brady Statistic RV Percent Paced: 94.92 %
Implantable Lead Implant Date: 20160210
Implantable Lead Location: 753859
Implantable Lead Model: 5076
Implantable Pulse Generator Implant Date: 20160210
Lead Channel Impedance Value: 437 Ohm
Lead Channel Impedance Value: 475 Ohm
Lead Channel Pacing Threshold Amplitude: 0.625 V
Lead Channel Pacing Threshold Pulse Width: 0.4 ms
Lead Channel Sensing Intrinsic Amplitude: 1.25 mV
Lead Channel Setting Pacing Amplitude: 2.5 V
Lead Channel Setting Sensing Sensitivity: 2 mV
MDC IDC LEAD IMPLANT DT: 20160210
MDC IDC LEAD LOCATION: 753860
MDC IDC MSMT LEADCHNL RA IMPEDANCE VALUE: 304 Ohm
MDC IDC MSMT LEADCHNL RA PACING THRESHOLD AMPLITUDE: 1 V
MDC IDC MSMT LEADCHNL RA SENSING INTR AMPL: 1.25 mV
MDC IDC MSMT LEADCHNL RV IMPEDANCE VALUE: 342 Ohm
MDC IDC MSMT LEADCHNL RV PACING THRESHOLD PULSEWIDTH: 0.4 ms
MDC IDC MSMT LEADCHNL RV SENSING INTR AMPL: 4.625 mV
MDC IDC MSMT LEADCHNL RV SENSING INTR AMPL: 4.625 mV
MDC IDC SESS DTM: 20181120142219
MDC IDC SET LEADCHNL RA PACING AMPLITUDE: 2 V
MDC IDC SET LEADCHNL RV PACING PULSEWIDTH: 0.4 ms
MDC IDC STAT BRADY AP VP PERCENT: 42.32 %
MDC IDC STAT BRADY AP VS PERCENT: 0.13 %
MDC IDC STAT BRADY AS VP PERCENT: 55.23 %

## 2017-06-25 NOTE — Progress Notes (Signed)
Remote pacemaker transmission.   

## 2017-07-03 ENCOUNTER — Encounter: Payer: Self-pay | Admitting: Cardiology

## 2017-07-23 ENCOUNTER — Telehealth: Payer: Self-pay | Admitting: Nurse Practitioner

## 2017-07-23 MED ORDER — TORSEMIDE 20 MG PO TABS: 60.0000 mg | ORAL_TABLET | Freq: Two times a day (BID) | ORAL | 11 refills | Status: DC

## 2017-07-23 NOTE — Telephone Encounter (Signed)
New MEssage  Pt c/o Shortness Of Breath: STAT if SOB developed within the last 24 hours or pt is noticeably SOB on the phone  1. Are you currently SOB (can you hear that pt is SOB on the phone)? Yes   2. How long have you been experiencing SOB?   3. Are you SOB when sitting or when up moving around? Both   4. Are you currently experiencing any other symptoms? Edema up in her sides now, increased fatigued

## 2017-07-23 NOTE — Telephone Encounter (Signed)
Mardella LaymanLindsey notified of instructions from Norma FredricksonLori Gerhardt, NP. Pt does not need new prescription sent in at this time.

## 2017-07-23 NOTE — Telephone Encounter (Signed)
Received call transferred directly from operator and spoke with Mardella LaymanLindsey [facility](pt's Hospice nurse). Mardella LaymanLindsey is calling to update Lawson FiscalLori on pt's condition. Mardella LaymanLindsey reports pt has been stable for last couple of months but for last 2 weeks has had gradual increase in lower extremity edema.  Edema now up to thighs. Pt is taking torsemide 40 mg twice daily. Has previously been told can increase to 60 mg in AM and 40 mg in PM for short time as needed.  Pt has taken this increased dose 3-4 times over the last 2 weeks. Pt complaining of increased fatigue. Mardella LaymanLindsey reports pt is short of breath all the time at baseline.  Pt feels more short of breath at times but other times is not aware her respirations are fast.  Resting respirations are 28-36. Will forward to Norma FredricksonLori Gerhardt, NP for review/recommendations. Call back number for Mardella LaymanLindsey is [phone]

## 2017-07-23 NOTE — Telephone Encounter (Signed)
Would advise increasing Torsemide to 60 mg BID.

## 2017-07-28 ENCOUNTER — Telehealth: Payer: Self-pay | Admitting: Internal Medicine

## 2017-07-28 NOTE — Telephone Encounter (Signed)
Received a call from the hospice facility stating patient having increased generalized edema despite being on torsemide 60mg , BID with respiratory distress. I requested they supplement the night dose with an additional 60mg  and check I/O's.  Requested they continue this until they see improvements. Also requested they call if they have any further questions.   [facility]te Cardiology fellow.

## 2017-07-30 ENCOUNTER — Telehealth: Payer: Self-pay

## 2017-07-30 NOTE — Telephone Encounter (Signed)
Lillia AbedLindsay, RN [facility](Hospice) called to report worsening swelling.  She called last week and Lori instructed her to increase Torsemide to 60 mg BID. Lillia AbedLindsay states they are taking Torsemide as directed and urinary output is good, but her swelling is worse.  She states her swelling has now "spread" to the groin area, abdomen and L arm. She estimates she has gained 10-20 pounds in 4-6 weeks. Her weight is now 138 lbs. She reports her resting RR is 30-40 (which is baseline- it is no worse).  Her HR =60s, BP= 130/86, O2 sats "fine."  She requests recommendations because she is so uncomfortable.  Discussed with Norma FredricksonLori Gerhardt, NP. Per Lawson FiscalLori, informed Lillia AbedLindsay that at this point the option is to take her to the hospital for IV diuresis for symptom management. She was grateful for call and stated she'd speak with the family to make a plan.

## 2017-08-01 ENCOUNTER — Telehealth: Payer: Self-pay | Admitting: *Deleted

## 2017-08-01 NOTE — Telephone Encounter (Signed)
Faxing order for hospice for torsemide (20 mg ) 3 tablets ( 60 mg ) bid.

## 2017-08-06 ENCOUNTER — Telehealth: Payer: Self-pay | Admitting: Cardiovascular Disease

## 2017-08-06 ENCOUNTER — Emergency Department (HOSPITAL_COMMUNITY)

## 2017-08-06 ENCOUNTER — Inpatient Hospital Stay (HOSPITAL_COMMUNITY)
Admission: EM | Admit: 2017-08-06 | Discharge: 2017-08-13 | DRG: 291 | Disposition: A | Discharge status: Home Health | Attending: Internal Medicine | Admitting: Internal Medicine

## 2017-08-06 ENCOUNTER — Encounter (HOSPITAL_COMMUNITY): Payer: Self-pay

## 2017-08-06 ENCOUNTER — Other Ambulatory Visit: Payer: Self-pay

## 2017-08-06 ENCOUNTER — Inpatient Hospital Stay (HOSPITAL_COMMUNITY)

## 2017-08-06 DIAGNOSIS — Z8042 Family history of malignant neoplasm of prostate: Secondary | ICD-10-CM

## 2017-08-06 DIAGNOSIS — N179 Acute kidney failure, unspecified: Secondary | ICD-10-CM | POA: Diagnosis present

## 2017-08-06 DIAGNOSIS — Z881 Allergy status to other antibiotic agents status: Secondary | ICD-10-CM | POA: Diagnosis not present

## 2017-08-06 DIAGNOSIS — Z8249 Family history of ischemic heart disease and other diseases of the circulatory system: Secondary | ICD-10-CM

## 2017-08-06 DIAGNOSIS — M109 Gout, unspecified: Secondary | ICD-10-CM | POA: Diagnosis present

## 2017-08-06 DIAGNOSIS — Z841 Family history of disorders of kidney and ureter: Secondary | ICD-10-CM | POA: Diagnosis not present

## 2017-08-06 DIAGNOSIS — Z8261 Family history of arthritis: Secondary | ICD-10-CM

## 2017-08-06 DIAGNOSIS — Z87891 Personal history of nicotine dependence: Secondary | ICD-10-CM | POA: Diagnosis not present

## 2017-08-06 DIAGNOSIS — I700 Atherosclerosis of aorta: Secondary | ICD-10-CM | POA: Diagnosis present

## 2017-08-06 DIAGNOSIS — R0602 Shortness of breath: Secondary | ICD-10-CM | POA: Diagnosis not present

## 2017-08-06 DIAGNOSIS — N183 Chronic kidney disease, stage 3 (moderate): Secondary | ICD-10-CM | POA: Diagnosis present

## 2017-08-06 DIAGNOSIS — I5043 Acute on chronic combined systolic (congestive) and diastolic (congestive) heart failure: Secondary | ICD-10-CM | POA: Diagnosis present

## 2017-08-06 DIAGNOSIS — I248 Other forms of acute ischemic heart disease: Secondary | ICD-10-CM | POA: Diagnosis present

## 2017-08-06 DIAGNOSIS — Z950 Presence of cardiac pacemaker: Secondary | ICD-10-CM | POA: Diagnosis not present

## 2017-08-06 DIAGNOSIS — I480 Paroxysmal atrial fibrillation: Secondary | ICD-10-CM | POA: Diagnosis present

## 2017-08-06 DIAGNOSIS — I4430 Unspecified atrioventricular block: Secondary | ICD-10-CM | POA: Diagnosis not present

## 2017-08-06 DIAGNOSIS — I130 Hypertensive heart and chronic kidney disease with heart failure and stage 1 through stage 4 chronic kidney disease, or unspecified chronic kidney disease: Secondary | ICD-10-CM | POA: Diagnosis not present

## 2017-08-06 DIAGNOSIS — Z79890 Hormone replacement therapy (postmenopausal): Secondary | ICD-10-CM | POA: Diagnosis not present

## 2017-08-06 DIAGNOSIS — Z91040 Latex allergy status: Secondary | ICD-10-CM

## 2017-08-06 DIAGNOSIS — Z823 Family history of stroke: Secondary | ICD-10-CM | POA: Diagnosis not present

## 2017-08-06 DIAGNOSIS — R600 Localized edema: Secondary | ICD-10-CM | POA: Insufficient documentation

## 2017-08-06 DIAGNOSIS — Z66 Do not resuscitate: Secondary | ICD-10-CM | POA: Diagnosis present

## 2017-08-06 DIAGNOSIS — I509 Heart failure, unspecified: Secondary | ICD-10-CM

## 2017-08-06 DIAGNOSIS — M1990 Unspecified osteoarthritis, unspecified site: Secondary | ICD-10-CM | POA: Diagnosis present

## 2017-08-06 DIAGNOSIS — R609 Edema, unspecified: Secondary | ICD-10-CM | POA: Diagnosis not present

## 2017-08-06 DIAGNOSIS — H3530 Unspecified macular degeneration: Secondary | ICD-10-CM | POA: Diagnosis present

## 2017-08-06 DIAGNOSIS — I350 Nonrheumatic aortic (valve) stenosis: Secondary | ICD-10-CM | POA: Diagnosis present

## 2017-08-06 DIAGNOSIS — I351 Nonrheumatic aortic (valve) insufficiency: Secondary | ICD-10-CM | POA: Diagnosis not present

## 2017-08-06 DIAGNOSIS — R0902 Hypoxemia: Secondary | ICD-10-CM | POA: Diagnosis not present

## 2017-08-06 DIAGNOSIS — J45909 Unspecified asthma, uncomplicated: Secondary | ICD-10-CM | POA: Diagnosis present

## 2017-08-06 DIAGNOSIS — E039 Hypothyroidism, unspecified: Secondary | ICD-10-CM | POA: Diagnosis present

## 2017-08-06 DIAGNOSIS — I5023 Acute on chronic systolic (congestive) heart failure: Secondary | ICD-10-CM | POA: Diagnosis not present

## 2017-08-06 DIAGNOSIS — Z885 Allergy status to narcotic agent status: Secondary | ICD-10-CM | POA: Diagnosis not present

## 2017-08-06 DIAGNOSIS — Z888 Allergy status to other drugs, medicaments and biological substances status: Secondary | ICD-10-CM | POA: Diagnosis not present

## 2017-08-06 LAB — CBC WITH DIFFERENTIAL/PLATELET
BASOS ABS: 0 10*3/uL (ref 0.0–0.1)
BASOS PCT: 0 %
Eosinophils Absolute: 0.1 10*3/uL (ref 0.0–0.7)
Eosinophils Relative: 1 %
HCT: 39.5 % (ref 36.0–46.0)
HEMOGLOBIN: 12.9 g/dL (ref 12.0–15.0)
Lymphocytes Relative: 17 %
Lymphs Abs: 1 10*3/uL (ref 0.7–4.0)
MCH: 28.8 pg (ref 26.0–34.0)
MCHC: 32.7 g/dL (ref 30.0–36.0)
MCV: 88.2 fL (ref 78.0–100.0)
Monocytes Absolute: 0.6 10*3/uL (ref 0.1–1.0)
Monocytes Relative: 11 %
NEUTROS ABS: 4.1 10*3/uL (ref 1.7–7.7)
NEUTROS PCT: 71 %
Platelets: 195 10*3/uL (ref 150–400)
RBC: 4.48 MIL/uL (ref 3.87–5.11)
RDW: 15.2 % (ref 11.5–15.5)
WBC: 5.8 10*3/uL (ref 4.0–10.5)

## 2017-08-06 LAB — BASIC METABOLIC PANEL
Anion gap: 10 (ref 5–15)
BUN: 61 mg/dL — AB (ref 6–20)
CALCIUM: 8.6 mg/dL — AB (ref 8.9–10.3)
CHLORIDE: 101 mmol/L (ref 101–111)
CO2: 25 mmol/L (ref 22–32)
Creatinine, Ser: 2.27 mg/dL — ABNORMAL HIGH (ref 0.44–1.00)
GFR calc non Af Amer: 17 mL/min — ABNORMAL LOW (ref >60)
GFR, EST AFRICAN AMERICAN: 20 mL/min — AB (ref >60)
Glucose, Bld: 114 mg/dL — ABNORMAL HIGH (ref 65–99)
Potassium: 4.8 mmol/L (ref 3.5–5.1)
SODIUM: 136 mmol/L (ref 135–145)

## 2017-08-06 LAB — TSH: TSH: 1.594 u[IU]/mL (ref 0.350–4.500)

## 2017-08-06 LAB — BRAIN NATRIURETIC PEPTIDE: B NATRIURETIC PEPTIDE 5: 3614.5 pg/mL — AB (ref 0.0–100.0)

## 2017-08-06 LAB — TROPONIN I: Troponin I: 0.22 ng/mL (ref <0.03)

## 2017-08-06 LAB — I-STAT TROPONIN, ED: TROPONIN I, POC: 0.14 ng/mL — AB (ref 0.00–0.08)

## 2017-08-06 MED ORDER — SODIUM CHLORIDE 0.9 % IV SOLN: 250.0000 mL | INTRAVENOUS | Status: DC | PRN

## 2017-08-06 MED ORDER — FUROSEMIDE 10 MG/ML IJ SOLN
60.0000 mg | Freq: Two times a day (BID) | INTRAMUSCULAR | Status: DC
Start: 2017-08-07 — End: 2017-08-08
  Administered 2017-08-07 – 2017-08-08 (×3): 60 mg via INTRAVENOUS
  Filled 2017-08-06 (×3): qty 6

## 2017-08-06 MED ORDER — MECLIZINE HCL 25 MG PO TABS: 25.0000 mg | ORAL_TABLET | Freq: Every day | ORAL | Status: DC | PRN

## 2017-08-06 MED ORDER — ENOXAPARIN SODIUM 60 MG/0.6ML ~~LOC~~ SOLN
1.0000 mg/kg | Freq: Once | SUBCUTANEOUS | Status: AC
  Administered 2017-08-07: 50 mg via SUBCUTANEOUS
  Filled 2017-08-06: qty 0.6

## 2017-08-06 MED ORDER — ACETAMINOPHEN 650 MG RE SUPP: 650.0000 mg | Freq: Four times a day (QID) | RECTAL | Status: DC | PRN

## 2017-08-06 MED ORDER — LEVOTHYROXINE SODIUM 50 MCG PO TABS
150.0000 ug | ORAL_TABLET | Freq: Every day | ORAL | Status: DC
  Administered 2017-08-07 – 2017-08-13 (×7): 150 ug via ORAL
  Filled 2017-08-06 (×7): qty 1

## 2017-08-06 MED ORDER — LUTEIN 20 MG PO TABS: 20.0000 mg | ORAL_TABLET | Freq: Every day | ORAL | Status: DC

## 2017-08-06 MED ORDER — CALCIUM CARBONATE ANTACID 500 MG PO CHEW
3.0000 | CHEWABLE_TABLET | Freq: Every day | ORAL | Status: DC
  Administered 2017-08-07 – 2017-08-13 (×7): 600 mg via ORAL
  Filled 2017-08-06 (×7): qty 3

## 2017-08-06 MED ORDER — SODIUM CHLORIDE 0.9% FLUSH: 3.0000 mL | INTRAVENOUS | Status: DC | PRN

## 2017-08-06 MED ORDER — SODIUM CHLORIDE 0.9% FLUSH
3.0000 mL | Freq: Two times a day (BID) | INTRAVENOUS | Status: DC
  Administered 2017-08-07 – 2017-08-13 (×14): 3 mL via INTRAVENOUS

## 2017-08-06 MED ORDER — FUROSEMIDE 10 MG/ML IJ SOLN
40.0000 mg | Freq: Once | INTRAMUSCULAR | Status: AC
  Administered 2017-08-06: 40 mg via INTRAVENOUS
  Filled 2017-08-06: qty 4

## 2017-08-06 MED ORDER — PROSIGHT PO TABS
1.0000 | ORAL_TABLET | Freq: Every day | ORAL | Status: DC
  Administered 2017-08-07 – 2017-08-13 (×7): 1 via ORAL
  Filled 2017-08-06 (×7): qty 1

## 2017-08-06 MED ORDER — VITAMIN D3 25 MCG (1000 UNIT) PO TABS
1000.0000 [IU] | ORAL_TABLET | Freq: Every day | ORAL | Status: DC
  Administered 2017-08-07 – 2017-08-13 (×7): 1000 [IU] via ORAL
  Filled 2017-08-06 (×7): qty 1

## 2017-08-06 MED ORDER — POTASSIUM CHLORIDE CRYS ER 10 MEQ PO TBCR
5.0000 meq | EXTENDED_RELEASE_TABLET | Freq: Every day | ORAL | Status: DC
  Administered 2017-08-07: 5 meq via ORAL
  Filled 2017-08-06 (×2): qty 1

## 2017-08-06 MED ORDER — ACETAMINOPHEN 325 MG PO TABS
650.0000 mg | ORAL_TABLET | Freq: Four times a day (QID) | ORAL | Status: DC | PRN
  Administered 2017-08-09: 650 mg via ORAL
  Filled 2017-08-06: qty 2

## 2017-08-06 NOTE — Progress Notes (Signed)
PHARMACIST - PHYSICIAN ORDER COMMUNICATION  CONCERNING: P&T Medication Policy on Herbal Medications  DESCRIPTION:  This patient's order for: Lutein has been noted.  This product(s) is classified as an "herbal" or natural product. Due to a lack of definitive safety studies or FDA approval, nonstandard manufacturing practices, plus the potential risk of unknown drug-drug interactions while on inpatient medications, the Pharmacy and Therapeutics Committee does not permit the use of "herbal" or natural products of this type within [facility].   ACTION TAKEN: The pharmacy department is unable to verify this order at this time and your patient has been informed of this safety policy. Please reevaluate patient's clinical condition at discharge and address if the herbal or natural product(s) should be resumed at that time.   Greer PickerelJigna Jaqualyn Juday, PharmD, BCPS Pager: [phone] 08/06/2017 9:52 PM

## 2017-08-06 NOTE — Progress Notes (Signed)
CRITICAL VALUE ALERT  Critical Value:  Troponin 0.22  Date & Time Notied:  08/06/17 22:34  Provider Notified: yes  Orders Received/Actions taken: none

## 2017-08-06 NOTE — Telephone Encounter (Signed)
I reviewed with Norma FredricksonLori Gerhardt, NP and admission [facility].  If pt was concerned about waiting in ED they could contact EMS for transport to hospital. I spoke with Mardella LaymanLindsey and gave her information from [place]. [facility] does not admit patients or give IV lasix at home. I advised Mardella LaymanLindsey they could call EMS for transfer to hospital. Mardella LaymanLindsey will discuss options with pt's family. I explained if pt was seen in office we could not guarantee pt would be directly admitted as it would depend on bed availability

## 2017-08-06 NOTE — ED Triage Notes (Signed)
EMS reports family called from hospice, increased bio lateral lower leg edema and left arm upper edema. Pain in bilateral legs and knees. Hospice Pt. Crackles in Lower lung lobes and wheezing. Hx of CHF, Hypertension. No CPAP or O2 per Pt. DNR  BP 104/56 HR 74 Resp 22 Sp02 94 RA CBG 88  20ga R forearm

## 2017-08-06 NOTE — Telephone Encounter (Signed)
Received call transferred directly from operator and spoke with pt's daughter and then [facility](Hospice nurse). They are asking if pt can be a direct admit to hospital as pt / daughter do not want pt to have to go through emergency room.  They report pt was directly admitted last year when she had a similar episode. Per previous phone note admission for IV diuresis was next option. Mardella LaymanLindsey reports pt is full of fluid. Seems to be the same as it was on 12/26. Swelling in buttocks and thighs. Daughter can transport pt to hospital.

## 2017-08-06 NOTE — ED Provider Notes (Signed)
[facility]-EMERGENCY DEPT Provider Note   CSN: [id number] Arrival date & time: 08/06/17  1614     History   Chief Complaint Chief Complaint  Patient presents with  . Leg Swelling    HPI Kara Cruz is a 84 y.o. female with a PMHx of CHF (EF 40-45% in 03/2016), macular degeneration, aortic stenosis, AV block s/p medtronic pacemaker, gout, HTN, hypothyroidism, pAFib, and other conditions listed below, who presents to the ED accompanied by her brother and her daughter, with complaints of gradually worsening b/l lower extremity, abdomen, and LUE swelling ongoing since July 2018 but gradually worsening since ~November 2018. Per chart review, on 07/23/17 they called her cardiologist regarding some worsening body swelling; they were instructed to increase torsemide from 40mg  BID to 60mg  BID. On 07/30/17 they called again and the NP that day recommended admission for IV diuresis being the next step; the family was given the option. It does not appear they ever brought her in for admission, however today they called her cardiologist's office again and spoke with Dr. Excell Seltzer asking if he could directly admit her for IV diuresis, however he advised that he couldn't do this, and that she would need to be admitted through the ED for IV diuresis.  Pt states that she's been taking the 60mg  torsemide BID (and on weekends she takes an additional 40mg  QHS on top of that) and has had great UOP however hasn't noticed a great difference in her swelling.  She states that if she props up her legs and arm, the swelling seems to improve, and that hanging her extremities with gravity seems to worsen her swelling.  She reports associated shortness of breath and wheezing.  She denies any fevers, chills, CP, new or worsening cough, abdominal pain, nausea, vomiting, diarrhea, constipation, dysuria, hematuria, myalgias, arthralgias, new or worsening numbness or tingling, focal weakness, erythema or warmth of  the extremities, or any other complaints at this time.   The history is provided by the patient, medical records and a relative. No language interpreter was used.    Past Medical History:  Diagnosis Date  . Acute on chronic systolic heart failure (HCC)   . Age-related macular degeneration   . Aortic stenosis    a. moderate  . AV heart block    a. 09/2014 Medtronic MRI compatible dual chamber pacemaker.  . Frequent headaches   . Gout   . HTN (hypertension)   . Hypothyroidism   . Osteoarthritis   . Paroxysmal atrial fibrillation (HCC)    a. identified by device interrogation    Patient Active Problem List   Diagnosis Date Noted  . Acute on chronic systolic HF (heart failure) (HCC) 07/23/2016  . AMD (age-related macular degeneration), wet (HCC) 10/06/2014  . Retinal macular atrophy 10/06/2014  . History of surgical procedure 10/06/2014  . Complete heart block (HCC) 09/14/2014  . Aortic stenosis, severe 09/14/2014  . Degeneration macular 07/16/2011  . GOUT 06/28/2008  . ACTINIC KERATOSIS 06/26/2007  . Hypothyroidism 03/19/2007  . Essential hypertension 03/19/2007  . RIGHT BUNDLE BRANCH BLOCK 03/19/2007  . OSTEOPOROSIS 03/19/2007    Past Surgical History:  Procedure Laterality Date  . DILATATION & CURETTAGE/HYSTEROSCOPY WITH MYOSURE    . euflexa into knees    . eye injections    . PERMANENT PACEMAKER INSERTION N/A 09/14/2014   MDT MRI compatible dual chamber pacemaker implanted by Dr Graciela Husbands  . TONSILLECTOMY     1928    OB History  No data available       Home Medications    Prior to Admission medications   Medication Sig Start Date End Date Taking? Authorizing Provider  calcium carbonate (TUMS EX) 750 MG chewable tablet Chew 2 tablets by mouth daily.    [provider]  cholecalciferol (VITAMIN D) 1000 UNITS tablet Take 1,000 Units by mouth daily.    [provider]  levothyroxine (SYNTHROID, LEVOTHROID) 150 MCG tablet Take 1 tablet (150 mcg  total) by mouth daily before breakfast. 07/30/16   Laverda Page B, NP  Lutein 20 MG TABS Take 20 mg by mouth daily.     [provider]  meclizine (ANTIVERT) 25 MG tablet Take 25 mg by mouth daily as needed for dizziness (or vertigo).     [provider]  Multiple Vitamins-Minerals (PRESERVISION AREDS 2) CAPS Take 2 tablets by mouth daily.    [provider]  Polyvinyl Alcohol-Povidone (REFRESH OP) Place 1 drop into both eyes at bedtime.    [provider]  Potassium 99 MG TABS Take 198 mg by mouth daily.    [provider]  torsemide (DEMADEX) 20 MG tablet Take 3 tablets (60 mg total) by mouth 2 (two) times daily. 07/23/17   Rosalio Macadamia, NP    Family History Family History  Problem Relation Age of Onset  . Hypertension Mother   . Arthritis Mother   . Stroke Mother   . Heart attack Father   . Stroke Brother   . Prostate cancer Brother   . Heart disease Brother   . Kidney failure Brother   . Heart disease Brother   . Arthritis Brother   . Edema Brother     Social History Social History   Tobacco Use  . Smoking status: Former Games developer  . Smokeless tobacco: Never Used  Substance Use Topics  . Alcohol use: No  . Drug use: No     Allergies   Labetalol; Prednisone; Latex; Moxifloxacin; Pseudoephedrine hcl; Codeine sulfate; Diphenhydramine hcl; Doxycycline hyclate; and Hibiclens [chlorhexidine]   Review of Systems Review of Systems  Constitutional: Negative for chills and fever.  Respiratory: Positive for shortness of breath and wheezing. Negative for cough.   Cardiovascular: Positive for leg swelling. Negative for chest pain.  Gastrointestinal: Negative for abdominal pain, constipation, diarrhea, nausea and vomiting.  Genitourinary: Negative for dysuria and hematuria.  Musculoskeletal: Negative for arthralgias and myalgias.  Skin: Negative for color change.  Allergic/Immunologic: Negative for immunocompromised state.    Neurological: Negative for weakness and numbness.  Psychiatric/Behavioral: Negative for confusion.   All other systems reviewed and are negative for acute change except as noted in the HPI.    Physical Exam Updated Vital Signs BP (!) 113/93 (BP Location: Right Arm)   Pulse 75   Temp 97.8 F (36.6 C) (Oral)   Resp 20   Ht 5\' 2"  (1.575 m)   Wt 52.2 kg (115 lb)   SpO2 96%   BMI 21.03 kg/m   Physical Exam  Constitutional: She is oriented to person, place, and time. Vital signs are normal. She appears well-developed and well-nourished.  Non-toxic appearance. No distress.  Afebrile, nontoxic, NAD  HENT:  Head: Normocephalic and atraumatic.  Mouth/Throat: Oropharynx is clear and moist and mucous membranes are normal.  Eyes: Conjunctivae and EOM are normal. Right eye exhibits no discharge. Left eye exhibits no discharge.  Neck: Normal range of motion. Neck supple.  Cardiovascular: Normal rate, regular rhythm and intact distal pulses. Exam reveals no  gallop and no friction rub.  Murmur heard.  Systolic murmur is present. RRR, nl s1/s2, holosystolic murmur best heard at upper sternal borders, no rubs/gallops appreciated, distal pulses intact, 4+ b/l lower extremity pedal edema up to thighs with skin taught but no significant erythema or warmth; 2+ pitting edema to LUE without erythema or warmth  Pulmonary/Chest: No accessory muscle usage. Tachypnea noted. No respiratory distress. She has no decreased breath sounds. She has wheezes. She has no rhonchi. She has rales.  Slightly tachypneic but no respiratory distress, some slight increased WOB, speaking in fragmented sentences. Bibasilar rales and faint ?expiratory wheezing vs rales throughout upper airways; no rhonchi appreciated. No significant hypoxia, SpO2 94-96% on RA   Abdominal: Soft. Normal appearance and bowel sounds are normal. She exhibits no distension. There is no tenderness. There is no rigidity, no rebound, no guarding, no CVA  tenderness, no tenderness at McBurney's point and negative Murphy's sign.  Musculoskeletal: Normal range of motion.  MAE x4 Strength and sensation grossly intact in all extremities Distal pulses intact B/l lower extremities with 4+ pitting edema as mentioned above, LUE with 2+ edema as mentioned above  Neurological: She is alert and oriented to person, place, and time. She has normal strength. No sensory deficit.  Skin: Skin is warm, dry and intact. No rash noted.  Psychiatric: She has a normal mood and affect.  Nursing note and vitals reviewed.    ED Treatments / Results  Labs (all labs ordered are listed, but only abnormal results are displayed) Labs Reviewed  BASIC METABOLIC PANEL - Abnormal; Notable for the following components:      Result Value   Glucose, Bld 114 (*)    BUN 61 (*)    Creatinine, Ser 2.27 (*)    Calcium 8.6 (*)    GFR calc non Af Amer 17 (*)    GFR calc Af Amer 20 (*)    All other components within normal limits  BRAIN NATRIURETIC PEPTIDE - Abnormal; Notable for the following components:   B Natriuretic Peptide 3,614.5 (*)    All other components within normal limits  I-STAT TROPONIN, ED - Abnormal; Notable for the following components:   Troponin i, poc 0.14 (*)    All other components within normal limits  CBC WITH DIFFERENTIAL/PLATELET    EKG  EKG Interpretation None       Radiology Dg Chest 2 View  Result Date: 08/06/2017 CLINICAL DATA:  Shortness of breath, fluid overload. Evaluate for CHF. EXAM: CHEST  2 VIEW COMPARISON:  Chest x-ray dated 09/16/2014. FINDINGS: Cardiomegaly. Central pulmonary vascular congestion without overt alveolar pulmonary edema. Probable small bilateral pleural effusions. Aortic atherosclerosis. Left chest wall pacemaker/ICD appears stable in configuration. No acute or suspicious osseous finding. IMPRESSION: 1. Cardiomegaly with central pulmonary vascular congestion consistent with mild CHF/volume overload. No evidence of  overt alveolar pulmonary edema. 2. Probable small bilateral pleural effusions. 3. Aortic atherosclerosis. Electronically Signed   By: Bary RichardStan  Maynard M.D.   On: 08/06/2017 17:45     Echo 04/03/16: Study Conclusions - Left ventricle: The cavity size was normal. There was moderate   focal basal hypertrophy of the septum. Systolic function was   mildly to moderately reduced. The estimated ejection fraction was   in the range of 40% to 45%. Diffuse hypokinesis. - Ventricular septum: Septal motion showed abnormal function and   dyssynergy. - Aortic valve: Valve mobility was restricted. There was moderate   to severe stenosis. There was moderate regurgitation. Peak  velocity (S): 397 cm/s. Mean gradient (S): 31 mm Hg. - Mitral valve: There was moderate regurgitation. - Left atrium: The atrium was severely dilated. - Tricuspid valve: There was mild-moderate regurgitation. - Pulmonic valve: There was moderate regurgitation. - Pulmonary arteries: PA peak pressure: 63 mm Hg (S).  Impressions: - LVEF may be underestimated due to frequent ectopy and LBBB.   Unable to view images from 11/2015, but by report systolic   function has worsened since that time.    Procedures Procedures (including critical care time)  Medications Ordered in ED Medications  furosemide (LASIX) injection 40 mg (40 mg Intravenous Given 08/06/17 1734)     Initial Impression / Assessment and Plan / ED Course  I have reviewed the triage vital signs and the nursing notes.  Pertinent labs & imaging results that were available during my care of the patient were reviewed by me and considered in my medical decision making (see chart for details).     84 y.o. female here with gradually worsening peripheral edema and SOB x1-2 months, was advised to increase torsemide to 60mg  BID 2wks ago but this has not helped the swelling significantly, despite having great UOP. On exam, tachypneic, speaking in fragmented sentences, bibasilar  rales and faint expiratory wheezing vs rales, SpO2 94-96% on RA, with 4+ pitting edema in b/l legs as well as 2+ edema in LUE, skin taught in legs but no significant erythema or warmth, no erythema or warmth of the LUE. Holosystolic murmur best heard at upper sternal borders c/w aortic stenosis. Exam c/w CHF exacerbation. Pt's cardiologist already recommended admission for IV diuresis, which I think is reasonable. Will get labs and CXR, and start diuresis (40mg  IV lasix for now, until we see what her kidney function is); will reassess shortly. Discussed case with my attending Dr. Fredderick Phenix who agrees with plan.   6:08 PM CBC w/diff WNL. BMP with mild AKI Cr 2.27 and BUN 61 (up from baseline of Cr 1.4-1.7/BUN 40-60). BNP 3614.5. Trop 0.14, mildly elevated, likely from volume overload related cardiac strain. EKG with ventricular paced rhythm. CXR with cardiomegaly and central pulmonary vascular congestion c/w mild CHF/volume overload, no overt alveolar pulmonary edema; also with probable small b/l pleural effusions.  Will proceed with admission for CHF exacerbation/volume overload.   6:23 PM Dr. Selena Batten of [facility] returning page and will admit. Holding orders to be placed by admitting team. Please see their notes for further documentation of care. I appreciate their help with this pleasant pt's care. Pt stable at time of admission.    Final Clinical Impressions(s) / ED Diagnoses   Final diagnoses:  Peripheral edema  Acute congestive heart failure, unspecified heart failure type (HCC)  SOB (shortness of breath)  AKI (acute kidney injury) [facility])    ED Discharge Orders    [address] [place], [place] 08/06/17 1823    Rolan Bucco, MD 08/06/17 [phone]

## 2017-08-06 NOTE — H&P (Signed)
TRH H&P   Patient Demographics:    Kara Cruz, is a 84 y.o. female  MRN: [id number]   DOB - 11/26/1917  Admit Date - 08/06/2017  Outpatient Primary MD for the patient is Jarome MatinPaterson, Daniel, MD  Referring MD/NP/PA: Rhona RaiderMercedes Street   Outpatient Specialists:  Dr. Tonny BollmanMichael Cooper (cardiology) Dr. Graciela HusbandsKlein  Patient coming from: home  Chief Complaint  Patient presents with  . Leg Swelling      HPI:    Kara Cruz  is a 84 y.o. female, w Hypothyroidism, Pafib, AVB s/p pacer, Aortic stenosis (moderate-severe), Moderate MR,   CHF (40-45%)  Presents with c/o left arm swelling for about 7-10 days.  Her bilateral lower ext have been swollen as well.  Pt has gained 15-25lbs over the past 2 weeks.  Pt sometimes has intermittent right sided chest pain for the past 6months.  Pt denies orthopnea, pnd.  ED requested admission for edema.   In ED,   CXR IMPRESSION: 1. Cardiomegaly with central pulmonary vascular congestion consistent with mild CHF/volume overload. No evidence of overt alveolar pulmonary edema. 2. Probable small bilateral pleural effusions. 3. Aortic atherosclerosis.  Wbc 5.8, Hgb 12.9, Plt 195 Bun/ Creatinine 61/2.27  Glucose 114  BNP 3,614.5 Trop I 0.14  Pt will be admitted for LUE and bilateral lower ext edema as well as troponin elevation.     Review of systems:    In addition to the HPI above,   Pt had had torsemide increase from 60mg  po bid to 60mg  po bid plus 40mg  po qhs No Fever-chills, No Headache, No changes with Vision or hearing, No problems swallowing food or Liquids, No Cough, no Dyspnea beyond her baseline No Abdominal pain, No Nausea or Vommitting, Bowel movements are regular, No Blood in stool or Urine, No dysuria, No new skin rashes or bruises, No new joints pains-aches,  No new weakness, tingling, numbness in any extremity, No recent  weight gain or loss, No polyuria, polydypsia or polyphagia, No significant Mental Stressors.  A full 10 point Review of Systems was done, except as stated above, all other Review of Systems were negative.   With Past History of the following :    Past Medical History:  Diagnosis Date  . Acute on chronic systolic heart failure (HCC)   . Age-related macular degeneration   . Aortic stenosis    a. moderate  . AV heart block    a. 09/2014 Medtronic MRI compatible dual chamber pacemaker.  . Frequent headaches   . Gout   . HTN (hypertension)   . Hypothyroidism   . Osteoarthritis   . Paroxysmal atrial fibrillation (HCC)    a. identified by device interrogation      Past Surgical History:  Procedure Laterality Date  . DILATATION & CURETTAGE/HYSTEROSCOPY WITH MYOSURE    . euflexa into knees    . eye injections    .  PERMANENT PACEMAKER INSERTION N/A 09/14/2014   MDT MRI compatible dual chamber pacemaker implanted by Dr Graciela Husbands  . TONSILLECTOMY     1928      Social History:     Social History   Tobacco Use  . Smoking status: Former Games developer  . Smokeless tobacco: Never Used  Substance Use Topics  . Alcohol use: No     Lives - at home w daughter  Mobility - walks with walker at home   Family History :     Family History  Problem Relation Age of Onset  . Hypertension Mother   . Arthritis Mother   . Stroke Mother   . Heart attack Father   . Stroke Brother   . Prostate cancer Brother   . Heart disease Brother   . Kidney failure Brother   . Heart disease Brother   . Arthritis Brother   . Edema Brother      Home Medications:   Prior to Admission medications   Medication Sig Start Date End Date Taking? Authorizing Provider  calcium carbonate (TUMS EX) 750 MG chewable tablet Chew 2 tablets by mouth daily.   Yes [provider]  cholecalciferol (VITAMIN D) 1000 UNITS tablet Take 1,000 Units by mouth daily.   Yes [provider]  levothyroxine  (SYNTHROID, LEVOTHROID) 150 MCG tablet Take 1 tablet (150 mcg total) by mouth daily before breakfast. 07/30/16  Yes Laverda Page B, NP  Lutein 20 MG TABS Take 20 mg by mouth daily.    Yes [provider]  Multiple Vitamins-Minerals (PRESERVISION AREDS 2) CAPS Take 2 tablets by mouth daily.   Yes [provider]  Potassium 99 MG TABS Take 198 mg by mouth daily.   Yes [provider]  torsemide (DEMADEX) 20 MG tablet Take 3 tablets (60 mg total) by mouth 2 (two) times daily. Patient taking differently: Take 40-60 mg by mouth as directed. Mon-Fri: Take 3 tablets (60 mg) in am & Take 3 tablets (60 mg) in the pm    Sat-Sun: Take 3 tablets (60 mg) in the am, Take 3 tablets (60 mg) in the pm and Take 2 tablets (40 mg) at bedtime. 07/23/17  Yes Rosalio Macadamia, NP  meclizine (ANTIVERT) 25 MG tablet Take 25 mg by mouth daily as needed for dizziness (or vertigo).     [provider]  Polyvinyl Alcohol-Povidone (REFRESH OP) Place 1 drop into both eyes at bedtime.    [provider]     Allergies:     Allergies  Allergen Reactions  . Labetalol Swelling    Blisters around lips Swollen lips & eyes, blistering lips, dry mouth  . Prednisone Other (See Comments)    REACTION: SOB, difficulty swallowing  . Latex Itching  . Moxifloxacin Other (See Comments)    Doesn't remember  . Pseudoephedrine Hcl Other (See Comments)  . Codeine Sulfate Other (See Comments)    REACTION: "out of body experience"--"floats"  . Diphenhydramine Hcl Other (See Comments)    REACTION: headache, vertigo  . Doxycycline Hyclate Other (See Comments)    REACTION: nausea, vomiting  . Hibiclens [Chlorhexidine] Rash    wipes     Physical Exam:   Vitals  Blood pressure 131/64, pulse 72, temperature 97.8 F (36.6 C), temperature source Oral, resp. rate 20, height 5\' 2"  (1.575 m), weight 52.2 kg (115 lb), SpO2 94 %.   1. General  lying in bed in NAD,    2. Normal affect and  insight, Not  Suicidal or Homicidal, Awake Alert, Oriented X 3.  3. No F.N deficits, ALL C.Nerves Intact, Strength 5/5 all 4 extremities, Sensation intact all 4 extremities, Plantars down going.  4. Ears and Eyes appear Normal, Conjunctivae clear, PERRLA. Moist Oral Mucosa.  5. Supple Neck, No JVD, No cervical lymphadenopathy appriciated, No Carotid Bruits.  6. Symmetrical Chest wall movement, Good air movement bilaterally, CTAB.  7. rrr s1, s2, 2/6 sem rusb with radiation to the neck  8. Positive Bowel Sounds, Abdomen Soft, No tenderness, No organomegaly appriciated,No rebound -guarding or rigidity.  9.  No Cyanosis, LUE edema, Bilateral lower ext edema, negative homans  10. Good muscle tone,  joints appear normal , no effusions, Normal ROM.  11. No Palpable Lymph Nodes in Neck or Axillae    Data Review:    CBC Recent Labs  Lab 08/06/17 1710  WBC 5.8  HGB 12.9  HCT 39.5  PLT 195  MCV 88.2  MCH 28.8  MCHC 32.7  RDW 15.2  LYMPHSABS 1.0  MONOABS 0.6  EOSABS 0.1  BASOSABS 0.0   ------------------------------------------------------------------------------------------------------------------  Chemistries  Recent Labs  Lab 08/06/17 1710  NA 136  K 4.8  CL 101  CO2 25  GLUCOSE 114*  BUN 61*  CREATININE 2.27*  CALCIUM 8.6*   ------------------------------------------------------------------------------------------------------------------ estimated creatinine clearance is 11.2 mL/min (A) (by C-G formula based on SCr of 2.27 mg/dL (H)). ------------------------------------------------------------------------------------------------------------------ No results for input(s): TSH, T4TOTAL, T3FREE, THYROIDAB in the last 72 hours.  Invalid input(s): FREET3  Coagulation profile No results for input(s): INR, PROTIME in the last 168 hours. ------------------------------------------------------------------------------------------------------------------- No results for  input(s): DDIMER in the last 72 hours. -------------------------------------------------------------------------------------------------------------------  Cardiac Enzymes No results for input(s): CKMB, TROPONINI, MYOGLOBIN in the last 168 hours.  Invalid input(s): CK ------------------------------------------------------------------------------------------------------------------    Component Value Date/Time   BNP 3,614.5 (H) 08/06/2017 1710   BNP 355.0 (H) 11/03/2015 1222     ---------------------------------------------------------------------------------------------------------------  Urinalysis No results found for: COLORURINE, APPEARANCEUR, LABSPEC, PHURINE, GLUCOSEU, HGBUR, BILIRUBINUR, KETONESUR, PROTEINUR, UROBILINOGEN, NITRITE, LEUKOCYTESUR  ----------------------------------------------------------------------------------------------------------------   Imaging Results:    Dg Chest 2 View  Result Date: 08/06/2017 CLINICAL DATA:  Shortness of breath, fluid overload. Evaluate for CHF. EXAM: CHEST  2 VIEW COMPARISON:  Chest x-ray dated 09/16/2014. FINDINGS: Cardiomegaly. Central pulmonary vascular congestion without overt alveolar pulmonary edema. Probable small bilateral pleural effusions. Aortic atherosclerosis. Left chest wall pacemaker/ICD appears stable in configuration. No acute or suspicious osseous finding. IMPRESSION: 1. Cardiomegaly with central pulmonary vascular congestion consistent with mild CHF/volume overload. No evidence of overt alveolar pulmonary edema. 2. Probable small bilateral pleural effusions. 3. Aortic atherosclerosis. Electronically Signed   By: Bary Richard M.D.   On: 08/06/2017 17:45   Paced rhythym    Assessment & Plan:    Principal Problem:   Edema Active Problems:   Hypothyroidism   ARF (acute renal failure) (HCC)    Edema Check ultrasound LUE, bilatera LE r/o DVT lovenox 1mg / kg [place] x1 Lasix 60mg  iv bid  ARF Check urine sodium,  urine creatine, urine eosinophils Renal ultrasound  Trop elevation Tele Trop I q6h x3 Check cardiac echo  Hypothyroidism Cont levothyroxine Check tsh  DVT Prophylaxis  Lovenox - SCDs  AM Labs Ordered, also please review Full Orders  Family Communication: Admission, patients condition and plan of care including tests being ordered have been discussed with the patient and daughter Kara Cruz) who indicate understanding and agree with the plan and Code Status.  Code Status DNR  Likely DC to  home  Condition GUARDED    Consults called: cardiology by email  Admission status: inpatient  Time spent in minutes : 45   Pearson Grippe M.D on 08/06/2017 at 7:02 PM  Between 7am to 7pm - Pager - [phone]. After 7pm go to [email or web address] - password [facility]  [facility] - Office  [phone]

## 2017-08-06 NOTE — ED Notes (Signed)
Delay on EKG technical difficulties with printer

## 2017-08-06 NOTE — Telephone Encounter (Signed)
Pt c/o swelling: STAT is pt has developed SOB within 24 hours  1) How much weight have you gained and in what time span? n/a  2) If swelling, where is the swelling located? Buttocks to feet, left arm swollen to down fingers  3) Are you currently taking a fluid pill? yes  4) Are you currently SOB? yes  5) Do you have a log of your daily weights (if so, list)? n/a  6) Have you gained 3 pounds in a day or 5 pounds in a week? n/a  7) Have you traveled recently? No    Patient daughter Britta Mccreedy([name]) calling, would like to have patient admitted to hospital from Dr. Excell Seltzerooper office, states that whenever they go to the ED, patient picks up a disease and she would like to avoid it.

## 2017-08-06 NOTE — ED Notes (Signed)
Hospice RN called to inform ED patient was coming over due to fluid retention-has been slowly retaining fluid for 2 weeks-

## 2017-08-06 NOTE — Progress Notes (Signed)
RN called critical troponin of .22. NP called RN. Pt has had NO chest pain. This is likely demand ischemia secondary to her heart failure. Continue to cycle enzymes. VSS.  KJKG, NP [facility]

## 2017-08-06 NOTE — Progress Notes (Signed)
[facility] Lia[facility]: RN visit  Patient was brought to ED for evaluation and treatment of worsening edema. Patient's PCP and [facility]  are aware.   Visited patient at bedside in ED. Daughter Britta MccreedyBarbara is at bedside. Patient does not appear in distress. She denies pain or SOB. She is on RA with SpO2 of 96%, BP 113/93, HR 75, Resp 20. She is still being assessed at time of visit and has not seen ED provider.   Hospice will continue to follow and anticipate needs.   Please call with any hospice related questions.  If patient needs ambulance transport at discharge please call [facility] for transport.   Thank you,  Elsie SaasMary Caytlin Robertson, RN, [facility]  Hospital Liaison [phone]  All hospital liaisons are on AMION.

## 2017-08-07 ENCOUNTER — Inpatient Hospital Stay (HOSPITAL_COMMUNITY)

## 2017-08-07 ENCOUNTER — Other Ambulatory Visit: Payer: Self-pay

## 2017-08-07 DIAGNOSIS — R609 Edema, unspecified: Secondary | ICD-10-CM

## 2017-08-07 DIAGNOSIS — I351 Nonrheumatic aortic (valve) insufficiency: Secondary | ICD-10-CM

## 2017-08-07 DIAGNOSIS — I509 Heart failure, unspecified: Secondary | ICD-10-CM

## 2017-08-07 LAB — ECHOCARDIOGRAM COMPLETE
HEIGHTINCHES: 62 in
WEIGHTICAEL: 2380.97 [oz_av]

## 2017-08-07 LAB — COMPREHENSIVE METABOLIC PANEL
ALT: 12 U/L — AB (ref 14–54)
AST: 24 U/L (ref 15–41)
Albumin: 3.1 g/dL — ABNORMAL LOW (ref 3.5–5.0)
Alkaline Phosphatase: 110 U/L (ref 38–126)
Anion gap: 11 (ref 5–15)
BUN: 63 mg/dL — AB (ref 6–20)
CHLORIDE: 103 mmol/L (ref 101–111)
CO2: 23 mmol/L (ref 22–32)
CREATININE: 2.17 mg/dL — AB (ref 0.44–1.00)
Calcium: 8.5 mg/dL — ABNORMAL LOW (ref 8.9–10.3)
GFR calc Af Amer: 21 mL/min — ABNORMAL LOW (ref >60)
GFR calc non Af Amer: 18 mL/min — ABNORMAL LOW (ref >60)
Glucose, Bld: 101 mg/dL — ABNORMAL HIGH (ref 65–99)
POTASSIUM: 3.4 mmol/L — AB (ref 3.5–5.1)
SODIUM: 137 mmol/L (ref 135–145)
Total Bilirubin: 1 mg/dL (ref 0.3–1.2)
Total Protein: 6 g/dL — ABNORMAL LOW (ref 6.5–8.1)

## 2017-08-07 LAB — CBC
HCT: 38.5 % (ref 36.0–46.0)
Hemoglobin: 12.5 g/dL (ref 12.0–15.0)
MCH: 28.4 pg (ref 26.0–34.0)
MCHC: 32.5 g/dL (ref 30.0–36.0)
MCV: 87.5 fL (ref 78.0–100.0)
PLATELETS: 173 10*3/uL (ref 150–400)
RBC: 4.4 MIL/uL (ref 3.87–5.11)
RDW: 15.2 % (ref 11.5–15.5)
WBC: 5.1 10*3/uL (ref 4.0–10.5)

## 2017-08-07 LAB — TROPONIN I: Troponin I: 0.23 ng/mL (ref <0.03)

## 2017-08-07 LAB — SODIUM, URINE, RANDOM: SODIUM UR: 32 mmol/L

## 2017-08-07 MED ORDER — LEVALBUTEROL HCL 1.25 MG/0.5ML IN NEBU
1.2500 mg | INHALATION_SOLUTION | Freq: Four times a day (QID) | RESPIRATORY_TRACT | Status: DC | PRN
Start: 2017-08-07 — End: 2017-08-13
  Administered 2017-08-08 – 2017-08-10 (×4): 1.25 mg via RESPIRATORY_TRACT
  Filled 2017-08-07 (×4): qty 0.5

## 2017-08-07 NOTE — Progress Notes (Addendum)
PROGRESS NOTE [facility] Hospitalist   Kara Cruz   HYQ:[id number] DOB: 22-Jan-1918  DOA: 08/06/2017 PCP: Jarome Matin, MD   Brief Narrative:  Kara Cruz is a 84 year old female with medical history of PAF, severe aortic stenosis, hypothyroidism and CHF who presented to the emergency department complaining about leg and left arm swelling and  SOB for 2 weeks prior to admission.  Upon ED evaluation patient was found to have BNP > 3K, chest x-ray with increasing vascular congestion and volume overload and a slightly elevated troponin.  Patient was admitted for CHF exacerbation and diuresis. Patient on home hospice PTA.   Subjective: Patient seen and examined, her breathing has improved. Denies chest pain and palpitations.   Assessment & Plan: Acute on chronic combine systolic and diastolic CHF  EF 95-28% with incoordinated septal motion, Severe aortic stenosis.  BNP > 3K, CXR with increase in vascular congestion  Elevated troponin likely demand ischemia  Continue IV diuresis  Fluid restriction Low salt diet  Daily weight and strict I&O's  Cards recommendations appreciated - no further workup needed   AKI on CKD stage III  Cardiorenal syndrome - expect Cr to improve with Diuresis  Monitor BMP in AM   Severe Aortic stenosis  Conservative treatment - patient not candidate for surgical repair   Hypothyroidism   Nml TSH  Continue Synthroid   DVT prophylaxis: TED Hose  Code Status: DNR  Family Communication: Family at bedside  Disposition Plan: Home with hospice in the next 2-3 days   Consultants:   Cardiology   Procedures:   ECHO   Antimicrobials:  None    Objective: Vitals:   08/06/17 1915 08/06/17 2139 08/07/17 0550 08/07/17 0700  BP: (!) 142/75 129/88 115/74   Pulse: 72 72 64   Resp: (!) 27 (!) 22 (!) 21   Temp:  98.1 F (36.7 C) 97.6 F (36.4 C)   TempSrc:  Oral Oral   SpO2: 95% 94% 94%   Weight:  67.4 kg (148 lb 9.6 oz)  67.5 kg (148 lb 13 oz)    Height:  5\' 2"  (1.575 m)      Intake/Output Summary (Last 24 hours) at 08/07/2017 1641 Last data filed at 08/07/2017 1618 Gross per 24 hour  Intake 220 ml  Output 800 ml  Net -580 ml   Filed Weights   08/06/17 1630 08/06/17 2139 08/07/17 0700  Weight: 52.2 kg (115 lb) 67.4 kg (148 lb 9.6 oz) 67.5 kg (148 lb 13 oz)    Examination:  General exam: Appears calm and comfortable  HEENT: AC/AT, PERRLA, OP moist and clear Respiratory system: BS diminished, bibasilar crackles.  Cardiovascular system: S1 & S2 heard, RRR. Loud systolic murmur  Gastrointestinal system: Abdomen is nondistended, soft and nontender.  Central nervous system: Alert and oriented. No focal neurological deficits. Extremities: Anasarca Skin: No rashes, lesions or ulcers Psychiatry: Mood & affect appropriate.    Data Reviewed: I have personally reviewed following labs and imaging studies  CBC: Recent Labs  Lab 08/06/17 1710 08/07/17 0343  WBC 5.8 5.1  NEUTROABS 4.1  --   HGB 12.9 12.5  HCT 39.5 38.5  MCV 88.2 87.5  PLT 195 173   Basic Metabolic Panel: Recent Labs  Lab 08/06/17 1710 08/07/17 0343  NA 136 137  K 4.8 3.4*  CL 101 103  CO2 25 23  GLUCOSE 114* 101*  BUN 61* 63*  CREATININE 2.27* 2.17*  CALCIUM 8.6* 8.5*   GFR: Estimated Creatinine Clearance: 13.4 mL/min (  A) (by C-G formula based on SCr of 2.17 mg/dL (H)). Liver Function Tests: Recent Labs  Lab 08/07/17 0343  AST 24  ALT 12*  ALKPHOS 110  BILITOT 1.0  PROT 6.0*  ALBUMIN 3.1*   No results for input(s): LIPASE, AMYLASE in the last 168 hours. No results for input(s): AMMONIA in the last 168 hours. Coagulation Profile: No results for input(s): INR, PROTIME in the last 168 hours. Cardiac Enzymes: Recent Labs  Lab 08/06/17 2145 08/07/17 0343  TROPONINI 0.22* 0.23*   BNP (last 3 results) No results for input(s): PROBNP in the last 8760 hours. HbA1C: No results for input(s): HGBA1C in the last 72 hours. CBG: No results  for input(s): GLUCAP in the last 168 hours. Lipid Profile: No results for input(s): CHOL, HDL, LDLCALC, TRIG, CHOLHDL, LDLDIRECT in the last 72 hours. Thyroid Function Tests: Recent Labs    08/06/17 2145  TSH 1.594   Anemia Panel: No results for input(s): VITAMINB12, FOLATE, FERRITIN, TIBC, IRON, RETICCTPCT in the last 72 hours. Sepsis Labs: No results for input(s): PROCALCITON, LATICACIDVEN in the last 168 hours.  No results found for this or any previous visit (from the past 240 hour(s)).    Radiology Studies: Dg Chest 2 View  Result Date: 08/06/2017 CLINICAL DATA:  Shortness of breath, fluid overload. Evaluate for CHF. EXAM: CHEST  2 VIEW COMPARISON:  Chest x-ray dated 09/16/2014. FINDINGS: Cardiomegaly. Central pulmonary vascular congestion without overt alveolar pulmonary edema. Probable small bilateral pleural effusions. Aortic atherosclerosis. Left chest wall pacemaker/ICD appears stable in configuration. No acute or suspicious osseous finding. IMPRESSION: 1. Cardiomegaly with central pulmonary vascular congestion consistent with mild CHF/volume overload. No evidence of overt alveolar pulmonary edema. 2. Probable small bilateral pleural effusions. 3. Aortic atherosclerosis. Electronically Signed   By: Bary RichardStan  Maynard M.D.   On: 08/06/2017 17:45   [place] Renal  Result Date: 08/06/2017 CLINICAL DATA:  84 y/o  F; acute renal failure. EXAM: RENAL / URINARY TRACT ULTRASOUND COMPLETE COMPARISON:  None. FINDINGS: Right Kidney: Length: 13.9 cm. Thin echogenic cortex. 1.5 cm echogenic focus in lower pole with distal acoustic shadowing, probably a stone. Upper pole simple cyst measuring 1.9 cm. Left Kidney: Left upper quadrant mass measuring 10.8 x 3.9 x 6.0 cm in the left renal fossa, no normal kidney identified. Bladder: Distended bladder with volume of 690 cc.  Small volume of ascites. IMPRESSION: 1. Left upper quadrant mass measuring up to 10.8 cm in the left renal fossa. No normal left kidney  identified. CT or MRI recommended. 2. Atrophic right kidney with echogenic cortex compatible with medical renal disease. 3. Right kidney lower pole 1.5 cm stone and upper pole simple cyst. No hydronephrosis. Electronically Signed   By: Mitzi HansenLance  Furusawa-Stratton M.D.   On: 08/06/2017 21:10      Scheduled Meds: . calcium carbonate  3 tablet Oral Daily  . cholecalciferol  1,000 Units Oral Daily  . furosemide  60 mg Intravenous BID  . levothyroxine  150 mcg Oral QAC breakfast  . multivitamin  1 tablet Oral Daily  . potassium chloride  5 mEq Oral Daily  . sodium chloride flush  3 mL Intravenous Q12H   Continuous Infusions: . sodium chloride       LOS: 1 day    Time spent: Total of 35 minutes spent with pt, greater than 50% of which was spent in discussion of  treatment, counseling and coordination of care  Latrelle DodrillEdwin Silva, MD Pager: Text Page via [email or web address]   If 7PM-7AM, please  contact night-coverage [email or web address] 08/07/2017, 4:41 PM

## 2017-08-07 NOTE — Progress Notes (Signed)
Hospice and [facility]) Social Work (SW) note:   SW met with Pt/[name] who was at [employer] bedside. Pt stated she wasn't feel "great" has still not used the bathroom. RN in the room to provide care to Pt, SW met with [name] in the hallway. [name] stated she hopes Pt gets some relief from the swelling she is having, at this time she plans to take Pt home at hospital DC. [name] stated that "I am ok if we need a hospital bed for mom to have at home but she doesn't want one yet." [name] remains pcg for Pt at home, Pt has hospice RN/SW/chaplain services in place in the home at this time.  SW provided active/reflective listening; counseling to normalize feelings expressed; theraputic life review.  SW will continue to follow for support during this hospital stay. Please call [facility] with any needs or concerns: [place], LCSW

## 2017-08-07 NOTE — Progress Notes (Signed)
[facility]) Chaplain note:  Met with patient and daughter, [name], at bedside. Patient was being assisted back to bed after using the bathroom. Patient was very out of breath and complained that her wheezing felt worse. Patient processed how she does not feel well. She and daughter are hopeful she will feel better soon. Patient and [name] are members of [employer] in [place] and receive strong spiritual support. Patient stated that she has prayed to "every saint she knows for help." Chaplain provided much reassurance of faith. Patient continues to deny fears and her only concern is for her daughter. Chaplain validated feelings and provided reassurance to both patient and daughter, validating how they have cared for each other for many years. Patient reflected upon all of the difficult historical social events she has witnessed in her life, stating that she is tired. Chaplain facilitated this life review and meaning making, providing spiritual counsel to reframe and reflect on the patient's positive, joyful experiences. Patient requested prayer, which Chaplain provided. Will continue to follow patient during admission and once she discharges back home for spiritual and emotional support.  Please call with questions or concerns.  [place], [place] [phone]

## 2017-08-07 NOTE — Progress Notes (Signed)
*  PRELIMINARY RESULTS* Echocardiogram 2D Echocardiogram has been performed.  Jeryl Columbialliott, Thoma Paulsen 08/07/2017, 3:01 PM

## 2017-08-07 NOTE — Progress Notes (Signed)
Pt voided 500 cc, ambulated to BR. SRP, RN

## 2017-08-07 NOTE — Progress Notes (Signed)
[facility] Lia[facility] RN Visit.  This is a related and covered GIP admission of 08/06/2017 with [facility] diagnosis of Aortic stenosis per Dr. Idora FuFeldmann. Code status DNR. [facility] and admitted for increased edema not responding to treatment at home.   Visited with patient at bedside. Dr. Elease HashimotoNahser and daughter Britta MccreedyBarbara at bedside. The patient is alert and oriented. Denies pain and SOB. Complains of discomfort due to edema in left arm and legs.  The plan is to treat with IV Lasix, elevate extremities.     Medications: Lasix 60mg  IV BID. No PRNs have been given.  Dr. Kabrina FuFeldmann and Dr. Eloise HarmanPaterson notified of admission. Transfer Summary and medication list placed on shadow chart.   Will continue to follow while hospitalized and anticipate discharge needs.   If patient needs ambulance transport at discharge please call [facility] for transport.   Thank you  Elsie SaasMary Jayline Robertson RN [facility] Hospital Liaison  [phone]   All Hospital Liaisons are on AMION

## 2017-08-07 NOTE — Progress Notes (Signed)
Called MD concerning urinary output, after Lasix 60 mg administered, Total output 500cc+ after initial dose given this 10 am. Pt with BIL upper leg edema to abd and left arm swelling MD aware, Tedhose as ordered. VS unchanged, pt able to ambulate to BR. Will cont to monitor. SRP , RN

## 2017-08-07 NOTE — Progress Notes (Signed)
BLE and LUE venous duplex: negative for DVT. Farrel DemarkJill Eunice, RDMS, RVT

## 2017-08-07 NOTE — Consult Note (Signed)
Cardiology Consultation:   Patient ID: [name]; [id number]; 1918-01-15   Admit date: 08/06/2017 Date of Consult: 08/07/2017  Primary Care Provider: Jarome Matin, MD Primary Cardiologist: Kara Cruz  Primary Electrophysiologist:     Patient Profile:   Kara Cruz is a 84 y.o. female with a hx of severe aortic stenosis, hypothyroidism, paroxysmal atrial fibrillation, who is being seen today for the evaluation of progressive leg swelling and elevated troponin levels at the request of Kara Cruz.  History of Present Illness:   Kara Cruz is a 84 year old female with a history of severe aortic stenosis.  She has seen Kara Cruz in the past the patient refused TAVR.    She is not a candidate for standard AV replacement .   She has chronic combined systolic and diastolic congestive heart failure  She presents now with about 2 weeks of left arm swelling.  She is also had bilateral lower extremity swelling.  She is gained 15-25 pounds over  the past 2 weeks.  She has had progressive pain and swelling in her legs particularly when she walks.  She has home hospice.  She was brought to the hospital mainly for IV diuretics and comfort care.    Past Medical History:  Diagnosis Date  . Acute on chronic systolic heart failure (HCC)   . Age-related macular degeneration   . Aortic stenosis    a. moderate  . AV heart block    a. 09/2014 Medtronic MRI compatible dual chamber pacemaker.  . Frequent headaches   . Gout   . HTN (hypertension)   . Hypothyroidism   . Osteoarthritis   . Paroxysmal atrial fibrillation (HCC)    a. identified by device interrogation    Past Surgical History:  Procedure Laterality Date  . DILATATION & CURETTAGE/HYSTEROSCOPY WITH MYOSURE    . euflexa into knees    . eye injections    . PERMANENT PACEMAKER INSERTION N/A 09/14/2014   MDT MRI compatible dual chamber pacemaker implanted by Kara Cruz  . TONSILLECTOMY     1928     Home Medications:  Prior to  Admission medications   Medication Sig Start Date End Date Taking? Authorizing Provider  calcium carbonate (TUMS EX) 750 MG chewable tablet Chew 2 tablets by mouth daily.   Yes [provider]  cholecalciferol (VITAMIN D) 1000 UNITS tablet Take 1,000 Units by mouth daily.   Yes [provider]  levothyroxine (SYNTHROID, LEVOTHROID) 150 MCG tablet Take 1 tablet (150 mcg total) by mouth daily before breakfast. 07/30/16  Yes Kara Cruz  Lutein 20 MG TABS Take 20 mg by mouth daily.    Yes [provider]  Multiple Vitamins-Minerals (PRESERVISION AREDS 2) CAPS Take 2 tablets by mouth daily.   Yes [provider]  Potassium 99 MG TABS Take 198 mg by mouth daily.   Yes [provider]  torsemide (DEMADEX) 20 MG tablet Take 3 tablets (60 mg total) by mouth 2 (two) times daily. Patient taking differently: Take 40-60 mg by mouth as directed. Mon-Fri: Take 3 tablets (60 mg) in am & Take 3 tablets (60 mg) in the pm    Sat-Sun: Take 3 tablets (60 mg) in the am, Take 3 tablets (60 mg) in the pm and Take 2 tablets (40 mg) at bedtime. 07/23/17  Yes Kara Cruz  meclizine (ANTIVERT) 25 MG tablet Take 25 mg by mouth daily as needed for dizziness (or vertigo).     [provider]  Polyvinyl Alcohol-Povidone (REFRESH OP) Place 1 drop into both eyes at bedtime.    [provider]    Inpatient Medications: Scheduled Meds: . calcium carbonate  3 tablet Oral Daily  . cholecalciferol  1,000 Units Oral Daily  . furosemide  60 mg Intravenous BID  . levothyroxine  150 mcg Oral QAC breakfast  . multivitamin  1 tablet Oral Daily  . potassium chloride  5 mEq Oral Daily  . sodium chloride flush  3 mL Intravenous Q12H   Continuous Infusions: . sodium chloride     PRN Meds: sodium chloride, acetaminophen **OR** acetaminophen, meclizine, sodium chloride flush  Allergies:    Allergies  Allergen Reactions  . Labetalol Swelling     Blisters around lips Swollen lips & eyes, blistering lips, dry mouth  . Prednisone Other (See Comments)    REACTION: SOB, difficulty swallowing  . Latex Itching  . Moxifloxacin Other (See Comments)    Doesn't remember  . Pseudoephedrine Hcl Other (See Comments)  . Codeine Sulfate Other (See Comments)    REACTION: "out of body experience"--"floats"  . Diphenhydramine Hcl Other (See Comments)    REACTION: headache, vertigo  . Doxycycline Hyclate Other (See Comments)    REACTION: nausea, vomiting  . Hibiclens [Chlorhexidine] Rash    wipes    Social History:   Social History   Socioeconomic History  . Marital status: Widowed    Spouse name: Not on file  . Number of children: Not on file  . Years of education: Not on file  . Highest education level: Not on file  Social Needs  . Financial resource strain: Not on file  . Food insecurity - worry: Not on file  . Food insecurity - inability: Not on file  . Transportation needs - medical: Not on file  . Transportation needs - non-medical: Not on file  Occupational History  . Not on file  Tobacco Use  . Smoking status: Former Games developermoker  . Smokeless tobacco: Never Used  Substance and Sexual Activity  . Alcohol use: No  . Drug use: No  . Sexual activity: No  Other Topics Concern  . Not on file  Social History Narrative  . Not on file    Family History:    Family History  Problem Relation Age of Onset  . Hypertension Mother   . Arthritis Mother   . Stroke Mother   . Heart attack Father   . Stroke Brother   . Prostate cancer Brother   . Heart disease Brother   . Kidney failure Brother   . Heart disease Brother   . Arthritis Brother   . Edema Brother      ROS:  Please see the history of present illness.  ROS  All other ROS reviewed and negative.     Physical Exam/Data:   Vitals:   08/06/17 1915 08/06/17 2139 08/07/17 0550 08/07/17 0700  BP: (!) 142/75 129/88 115/74   Pulse: 72 72 64   Resp: (!) 27 (!) 22 (!) 21    Temp:  98.1 F (36.7 C) 97.6 F (36.4 C)   TempSrc:  Oral Oral   SpO2: 95% 94% 94%   Weight:  148 lb 9.6 oz (67.4 kg)  148 lb 13 oz (67.5 kg)  Height:  5\' 2"  (1.575 m)      Intake/Output Summary (Last 24 hours) at 08/07/2017 0835 Last data filed at 08/07/2017 0211 Gross per 24 hour  Intake 220 ml  Output 300 ml  Net -80 ml  Filed Weights   08/06/17 1630 08/06/17 2139 08/07/17 0700  Weight: 115 lb (52.2 kg) 148 lb 9.6 oz (67.4 kg) 148 lb 13 oz (67.5 kg)   Body mass index is 27.22 kg/m.  General:    Elderly female,  Bright.   NAD  HEENT: normal Lymph: no adenopathy Neck: no JVD Endocrine:  No thryomegaly Vascular: No carotid bruits; FA pulses 2+ bilaterally without bruits  Cardiac:  n distant heart sounds,  Soft systolic murmur  Lungs:  clear to auscultation bilaterally, no wheezing, rhonchi or rales  Abd: soft, nontender, no hepatomegaly  Ext:  Left upper arm is 3+ edematous.  Bilateral lower ext edema 2-3+  Musculoskeletal:  No deformities, BUE and BLE strength normal and equal Skin: warm and dry  Neuro:  CNs 2-12 intact, no focal abnormalities noted Psych:  Normal affect   EKG:  The EKG was personally reviewed and demonstrates:  V paced  Telemetry:  Telemetry was personally reviewed and demonstrates:   V-paced.   Relevant CV Studies:   Laboratory Data:  Chemistry Recent Labs  Lab 08/06/17 1710 08/07/17 0343  NA 136 137  K 4.8 3.4*  CL 101 103  CO2 25 23  GLUCOSE 114* 101*  BUN 61* 63*  CREATININE 2.27* 2.17*  CALCIUM 8.6* 8.5*  GFRNONAA 17* 18*  GFRAA 20* 21*  ANIONGAP 10 11    Recent Labs  Lab 08/07/17 0343  PROT 6.0*  ALBUMIN 3.1*  AST 24  ALT 12*  ALKPHOS 110  BILITOT 1.0   Hematology Recent Labs  Lab 08/06/17 1710 08/07/17 0343  WBC 5.8 5.1  RBC 4.48 4.40  HGB 12.9 12.5  HCT 39.5 38.5  MCV 88.2 87.5  MCH 28.8 28.4  MCHC 32.7 32.5  RDW 15.2 15.2  PLT 195 173   Cardiac Enzymes Recent Labs  Lab 08/06/17 2145 08/07/17 0343    TROPONINI 0.22* 0.23*    Recent Labs  Lab 08/06/17 1714  TROPIPOC 0.14*    BNP Recent Labs  Lab 08/06/17 1710  BNP 3,614.5*    DDimer No results for input(s): DDIMER in the last 168 hours.  Radiology/Studies:  Dg Chest 2 View  Result Date: 08/06/2017 CLINICAL DATA:  Shortness of breath, fluid overload. Evaluate for CHF. EXAM: CHEST  2 VIEW COMPARISON:  Chest x-ray dated 09/16/2014. FINDINGS: Cardiomegaly. Central pulmonary vascular congestion without overt alveolar pulmonary edema. Probable small bilateral pleural effusions. Aortic atherosclerosis. Left chest wall pacemaker/ICD appears stable in configuration. No acute or suspicious osseous finding. IMPRESSION: 1. Cardiomegaly with central pulmonary vascular congestion consistent with mild CHF/volume overload. No evidence of overt alveolar pulmonary edema. 2. Probable small bilateral pleural effusions. 3. Aortic atherosclerosis. Electronically Signed   By: Bary Richard M.D.   On: 08/06/2017 17:45   US Renal  Result Date: 08/06/2017 CLINICAL DATA:  84 y/o  F; acute renal failure. EXAM: RENAL / URINARY TRACT ULTRASOUND COMPLETE COMPARISON:  None. FINDINGS: Right Kidney: Length: 13.9 cm. Thin echogenic cortex. 1.5 cm echogenic focus in lower pole with distal acoustic shadowing, probably a stone. Upper pole simple cyst measuring 1.9 cm. Left Kidney: Left upper quadrant mass measuring 10.8 x 3.9 x 6.0 cm in the left renal fossa, no normal kidney identified. Bladder: Distended bladder with volume of 690 cc.  Small volume of ascites. IMPRESSION: 1. Left upper quadrant mass measuring up to 10.8 cm in the left renal fossa. No normal left kidney identified. CT or MRI recommended. 2. Atrophic right kidney with echogenic cortex compatible with  medical renal disease. 3. Right kidney lower pole 1.5 cm stone and upper pole simple cyst. No hydronephrosis. Electronically Signed   By: Mitzi Hansen M.D.   On: 08/06/2017 21:10    Assessment and  Plan:   1. 1.  Aortic stenosis: Patient has end-stage aortic stenosis.  She is 83 years old and is not a candidate for standard aortic valve replacement and she has also refused TAVR.  2. She She has lots of peripheral edema which is secondary to her combined systolic and diastolic congestive heart failure. Her elevated  troponin levels are due to her congestive heart failure and aortic stenosis. This is not unexpected given her aortic stenosis.  I have advised her to elevate her legs and elevate her arm as much as possible.  She needs IV diuretics.   We will allow her to eat today.  There is no indications for cardiac procedures today.  Will sign off. Further management per Int. Med and hospice team .   For questions or updates, please contact [facility] Please consult [email or web address] for contact info under Cardiology/STEMI.   Signed, Kristeen Miss, MD  08/07/2017 8:35 AM

## 2017-08-08 LAB — COMPREHENSIVE METABOLIC PANEL
ALBUMIN: 3.2 g/dL — AB (ref 3.5–5.0)
ALT: 13 U/L — AB (ref 14–54)
AST: 24 U/L (ref 15–41)
Alkaline Phosphatase: 116 U/L (ref 38–126)
Anion gap: 8 (ref 5–15)
BUN: 64 mg/dL — AB (ref 6–20)
CHLORIDE: 105 mmol/L (ref 101–111)
CO2: 25 mmol/L (ref 22–32)
CREATININE: 2.02 mg/dL — AB (ref 0.44–1.00)
Calcium: 8.7 mg/dL — ABNORMAL LOW (ref 8.9–10.3)
GFR calc Af Amer: 23 mL/min — ABNORMAL LOW (ref >60)
GFR calc non Af Amer: 20 mL/min — ABNORMAL LOW (ref >60)
Glucose, Bld: 92 mg/dL (ref 65–99)
Potassium: 3.4 mmol/L — ABNORMAL LOW (ref 3.5–5.1)
SODIUM: 138 mmol/L (ref 135–145)
Total Bilirubin: 1.4 mg/dL — ABNORMAL HIGH (ref 0.3–1.2)
Total Protein: 6.3 g/dL — ABNORMAL LOW (ref 6.5–8.1)

## 2017-08-08 LAB — CALCIUM / CREATININE RATIO, URINE
Calcium, Ur: 10.6 mg/dL
Calcium/Creat.Ratio: 200 mg/g creat (ref 0–260)
Creatinine, Urine: 53.1 mg/dL

## 2017-08-08 LAB — MAGNESIUM: Magnesium: 2.5 mg/dL — ABNORMAL HIGH (ref 1.7–2.4)

## 2017-08-08 MED ORDER — FUROSEMIDE 10 MG/ML IJ SOLN
60.0000 mg | Freq: Three times a day (TID) | INTRAMUSCULAR | Status: DC
  Administered 2017-08-08 – 2017-08-09 (×3): 60 mg via INTRAVENOUS
  Filled 2017-08-08 (×3): qty 6

## 2017-08-08 MED ORDER — POTASSIUM CHLORIDE CRYS ER 20 MEQ PO TBCR
20.0000 meq | EXTENDED_RELEASE_TABLET | Freq: Every day | ORAL | Status: DC
  Administered 2017-08-08 – 2017-08-13 (×6): 20 meq via ORAL
  Filled 2017-08-08 (×6): qty 1

## 2017-08-08 NOTE — Plan of Care (Addendum)
NT contacted RN indicating that patient was claiming to  have difficulty breathing. Respirations were 30, shallow and crackles noted. SPO2 on 1L was 98%. Familiy informed, "patient just got back from using the restroom, but it's been 30 minutes." Respiratory also contacted to assess, but indicated that a nebs treatment would probably not help, since it appears to be d/t fluid overload.   Patient boosted in bed and repositioned.

## 2017-08-08 NOTE — Plan of Care (Addendum)
1L humidified [place] placed for comfort and Resp contacted to administer PRN Neb treatment when able( per Dr. Jarrett Ablesrders.)

## 2017-08-08 NOTE — Plan of Care (Signed)
Resp contacted to give PRN neb treatment, per patient request.

## 2017-08-08 NOTE — Progress Notes (Signed)
PT Cancellation Note  Patient Details Name: Kara Cruz MRN: [id number] DOB: 04/22/1918   Cancelled Treatment:    Reason Eval/Treat Not Completed: Medical issues which prohibited therapy noted recent increased respiratory distress after mobilizing. Will check back tomorrow.   Rada HayHill, Kara Cruz Elizabeth 08/08/2017, 4:34 PM

## 2017-08-08 NOTE — Progress Notes (Signed)
[facility] RN Visit.  This is a related and covered GIP admission of 08/06/2017 with [facility] diagnosis of Aortic stenosis per Dr. Tarisa FuFeldmann. Code status DNR. [facility] and admitted for increased edema not responding to treatment at home.   Visited with patient at bedside. Patient's daughter at bedside. The patient is alert and oriented. Denies pain and SOB. Patient reports feeling a little better today. The edema has decreased but but is still evident in left arm and upper legs. She was able to be up in chair yesterday evening and ambulated with walker to bathroom today for NA to assist with bath. No physician visits so far today.  Medications: Lasix 60mg  IV BID. No PRNs have been given.  Dr. Zonie FuFeldmann and Dr. Eloise HarmanPaterson notified of admission. Transfer Summary and medication list placed on shadow chart.   Will continue to follow while hospitalized and anticipate discharge needs.   If patient needs ambulance transport at discharge please call [facility] for transport.   Thank you  Elsie SaasMary Myrtis Robertson RN [facility] Liaison  [phone]   All Hospital Liaisons are on AMION

## 2017-08-08 NOTE — Progress Notes (Signed)
PROGRESS NOTE [facility]   Kara Cruz   ZOX:[id number] DOB: 12/18/1917  DOA: 08/06/2017 PCP: Jarome Matin, MD   Brief Narrative:  Kara Cruz is a 84 year old female with medical history of PAF, severe aortic stenosis, hypothyroidism and CHF who presented to the emergency department complaining about leg and left arm swelling and  SOB for 2 weeks prior to admission.  Upon ED evaluation patient was found to have BNP > 3K, chest x-ray with increasing vascular congestion and volume overload and a slightly elevated troponin.  Patient was admitted for CHF exacerbation and diuresis. Patient on home hospice PTA.   Subjective: Patient seen and examined, report breathing feels about the same, although swelling has improved. No chest pain or palpitations   Assessment & Plan: Acute on chronic combine systolic and diastolic CHF  EF 81-19% with incoordinated septal motion, Severe aortic stenosis.  BNP > 3K, CXR with increase in vascular congestion  Elevated troponin likely demand ischemia  Increase IV Lasix to 60 TID  Continue fluid restriction Low salt diet  Daily weight  I&Os not accurate - patient refusing external catheter  Cards recommendations appreciated - no further workup needed   AKI on CKD stage III - improved  Cardiorenal syndrome - expect Cr to continue to improve with Diuresis  Monitor BMP in AM   Severe Aortic stenosis  Conservative treatment - patient not candidate for surgical repair   Hypothyroidism   Nml TSH  Continue Synthroid   DVT prophylaxis: TED Hose  Code Status: DNR  Family Communication: Family at bedside  Disposition Plan: Home with hospice in the next 2-3 days   Consultants:   Cardiology   Procedures:   ECHO   Antimicrobials:  None    Objective: Vitals:   08/07/17 2316 08/08/17 0701 08/08/17 1151 08/08/17 1307  BP: 122/73 140/74  132/64  Pulse: 69 69  72  Resp: 20 (!) 22  (!) 30  Temp: 97.6 F (36.4 C)   97.7 F (36.5 C)   TempSrc: Oral   Oral  SpO2: 93% 92% 96% 98%  Weight:  66.8 kg (147 lb 3.2 oz)    Height:        Intake/Output Summary (Last 24 hours) at 08/08/2017 1707 Last data filed at 08/08/2017 1542 Gross per 24 hour  Intake 630 ml  Output 1350 ml  Net -720 ml   Filed Weights   08/06/17 2139 08/07/17 0700 08/08/17 0701  Weight: 67.4 kg (148 lb 9.6 oz) 67.5 kg (148 lb 13 oz) 66.8 kg (147 lb 3.2 oz)    Examination:  General: Pt is alert, awake, not in acute distress Cardiovascular: RRR, S1/S2 loud systolic murmur  Respiratory: Air entry diminished, diffuse crackles b/l up to mid field   Abdominal: Soft, NT, ND, bowel sounds  Extremities: b/l LE 1-2 + pitting edema, L upper extremities edematous   Data Reviewed: I have personally reviewed following labs and imaging studies  CBC: Recent Labs  Lab 08/06/17 1710 08/07/17 0343  WBC 5.8 5.1  NEUTROABS 4.1  --   HGB 12.9 12.5  HCT 39.5 38.5  MCV 88.2 87.5  PLT 195 173   Basic Metabolic Panel: Recent Labs  Lab 08/06/17 1710 08/07/17 0343 08/08/17 0444  NA 136 137 138  K 4.8 3.4* 3.4*  CL 101 103 105  CO2 25 23 25   GLUCOSE 114* 101* 92  BUN 61* 63* 64*  CREATININE 2.27* 2.17* 2.02*  CALCIUM 8.6* 8.5* 8.7*  MG  --   --  2.5*   GFR: Estimated Creatinine Clearance: 14.3 mL/min (A) (by C-G formula based on SCr of 2.02 mg/dL (H)). Liver Function Tests: Recent Labs  Lab 08/07/17 0343 08/08/17 0444  AST 24 24  ALT 12* 13*  ALKPHOS 110 116  BILITOT 1.0 1.4*  PROT 6.0* 6.3*  ALBUMIN 3.1* 3.2*   No results for input(s): LIPASE, AMYLASE in the last 168 hours. No results for input(s): AMMONIA in the last 168 hours. Coagulation Profile: No results for input(s): INR, PROTIME in the last 168 hours. Cardiac Enzymes: Recent Labs  Lab 08/06/17 2145 08/07/17 0343  TROPONINI 0.22* 0.23*   BNP (last 3 results) No results for input(s): PROBNP in the last 8760 hours. HbA1C: No results for input(s): HGBA1C in the last 72  hours. CBG: No results for input(s): GLUCAP in the last 168 hours. Lipid Profile: No results for input(s): CHOL, HDL, LDLCALC, TRIG, CHOLHDL, LDLDIRECT in the last 72 hours. Thyroid Function Tests: Recent Labs    08/06/17 2145  TSH 1.594   Anemia Panel: No results for input(s): VITAMINB12, FOLATE, FERRITIN, TIBC, IRON, RETICCTPCT in the last 72 hours. Sepsis Labs: No results for input(s): PROCALCITON, LATICACIDVEN in the last 168 hours.  No results found for this or any previous visit (from the past 240 hour(s)).    Radiology Studies: Dg Chest 2 View  Result Date: 08/06/2017 CLINICAL DATA:  Shortness of breath, fluid overload. Evaluate for CHF. EXAM: CHEST  2 VIEW COMPARISON:  Chest x-ray dated 09/16/2014. FINDINGS: Cardiomegaly. Central pulmonary vascular congestion without overt alveolar pulmonary edema. Probable small bilateral pleural effusions. Aortic atherosclerosis. Left chest wall pacemaker/ICD appears stable in configuration. No acute or suspicious osseous finding. IMPRESSION: 1. Cardiomegaly with central pulmonary vascular congestion consistent with mild CHF/volume overload. No evidence of overt alveolar pulmonary edema. 2. Probable small bilateral pleural effusions. 3. Aortic atherosclerosis. Electronically Signed   By: Bary RichardStan  Maynard M.D.   On: 08/06/2017 17:45   [place] Renal  Result Date: 08/06/2017 CLINICAL DATA:  84 y/o  F; acute renal failure. EXAM: RENAL / URINARY TRACT ULTRASOUND COMPLETE COMPARISON:  None. FINDINGS: Right Kidney: Length: 13.9 cm. Thin echogenic cortex. 1.5 cm echogenic focus in lower pole with distal acoustic shadowing, probably a stone. Upper pole simple cyst measuring 1.9 cm. Left Kidney: Left upper quadrant mass measuring 10.8 x 3.9 x 6.0 cm in the left renal fossa, no normal kidney identified. Bladder: Distended bladder with volume of 690 cc.  Small volume of ascites. IMPRESSION: 1. Left upper quadrant mass measuring up to 10.8 cm in the left renal fossa. No  normal left kidney identified. CT or MRI recommended. 2. Atrophic right kidney with echogenic cortex compatible with medical renal disease. 3. Right kidney lower pole 1.5 cm stone and upper pole simple cyst. No hydronephrosis. Electronically Signed   By: Mitzi HansenLance  Furusawa-Stratton M.D.   On: 08/06/2017 21:10    Scheduled Meds: . calcium carbonate  3 tablet Oral Daily  . cholecalciferol  1,000 Units Oral Daily  . furosemide  60 mg Intravenous TID  . levothyroxine  150 mcg Oral QAC breakfast  . multivitamin  1 tablet Oral Daily  . potassium chloride  20 mEq Oral Daily  . sodium chloride flush  3 mL Intravenous Q12H   Continuous Infusions: . sodium chloride       LOS: 2 days   Time spent: Total of 35 minutes spent with pt, greater than 50% of which was spent in discussion of  treatment, counseling and coordination of care  Latrelle Dodrill, MD Pager: Text Page via [email or web address]   If 7PM-7AM, please contact night-coverage [email or web address] 08/08/2017, 5:07 PM

## 2017-08-08 NOTE — Plan of Care (Signed)
Family pressed light to inform that patient was not breathing well again. Assessment revealed respirations 38, shallow and crackles present. SPO2 reading 97% on 1L [place]. Family explained " patient had just moved from the side of the bed to lying down position (just 5 minutes ago). " Respiratory was standing in hall and also made quick assessment. Gave further education.  1600 lasix dose given early and patient repositioned.

## 2017-08-09 LAB — BASIC METABOLIC PANEL
Anion gap: 8 (ref 5–15)
BUN: 67 mg/dL — AB (ref 6–20)
CALCIUM: 8.5 mg/dL — AB (ref 8.9–10.3)
CO2: 25 mmol/L (ref 22–32)
CREATININE: 1.88 mg/dL — AB (ref 0.44–1.00)
Chloride: 107 mmol/L (ref 101–111)
GFR calc Af Amer: 25 mL/min — ABNORMAL LOW (ref >60)
GFR, EST NON AFRICAN AMERICAN: 21 mL/min — AB (ref >60)
GLUCOSE: 93 mg/dL (ref 65–99)
Potassium: 3.6 mmol/L (ref 3.5–5.1)
Sodium: 140 mmol/L (ref 135–145)

## 2017-08-09 LAB — MAGNESIUM: MAGNESIUM: 2.6 mg/dL — AB (ref 1.7–2.4)

## 2017-08-09 MED ORDER — FUROSEMIDE 10 MG/ML IJ SOLN
20.0000 mg | Freq: Once | INTRAMUSCULAR | Status: AC
  Administered 2017-08-09: 20 mg via INTRAVENOUS
  Filled 2017-08-09: qty 2

## 2017-08-09 MED ORDER — FUROSEMIDE 10 MG/ML IJ SOLN
80.0000 mg | Freq: Three times a day (TID) | INTRAMUSCULAR | Status: DC
  Administered 2017-08-09 – 2017-08-13 (×12): 80 mg via INTRAVENOUS
  Filled 2017-08-09 (×12): qty 8

## 2017-08-09 NOTE — Progress Notes (Signed)
BIl Ace wraps applied to legs from feet to thighs. SRP, RN

## 2017-08-09 NOTE — Progress Notes (Signed)
Pt refused thigh high tedhose, MD updated. SRP, RN

## 2017-08-09 NOTE — Evaluation (Signed)
Physical Therapy Evaluation Patient Details Name: Kara Cruz MRN: [id number] DOB: 04/18/1918 Today's Date: 08/09/2017   History of Present Illness  84 year old female with medical history of PAF, severe aortic stenosis, hypothyroidism and CHF who presented to the emergency department complaining about leg and left arm swelling and  SOB for 2 weeks prior to admission.  Upon ED evaluation patient was found to have BNP > 3K, chest x-ray with increasing vascular congestion and volume overload and a slightly elevated troponin.  Patient was admitted for CHF exacerbation and diuresis. Patient on home hospice PTA.   Clinical Impression  Pt admitted with above diagnosis. Pt currently with functional limitations due to the deficits listed below (see PT Problem List). Pt ambulated 15' with RW, with 3/4 dyspnea/wheezing, SaO2 87% on room air. At baseline she walks household distances independently with RW.  Pt will benefit from skilled PT to increase their independence and safety with mobility to allow discharge to the venue listed below.       Follow Up Recommendations Home health PT    Equipment Recommendations  None recommended by PT    Recommendations for Other Services       Precautions / Restrictions Precautions Precautions: Fall Precaution Comments: 1 fall in past year (Sept 2018 reached for something on bathroom floor) Restrictions Weight Bearing Restrictions: No      Mobility  Bed Mobility Overal bed mobility: Modified Independent             General bed mobility comments: HOB up 45*  Transfers Overall transfer level: Needs assistance Equipment used: Rolling walker (2 wheeled) Transfers: Sit to/from Stand Sit to Stand: From elevated surface;Min guard         General transfer comment: min/guard for safety  Ambulation/Gait Ambulation/Gait assistance: Min guard Ambulation Distance (Feet): 15 Feet Assistive device: Rolling walker (2 wheeled) Gait Pattern/deviations:  Step-through pattern;Trunk flexed   Gait velocity interpretation: at or above normal speed for age/gender General Gait Details: steady, no LOB, wheezing and 3/4 dyspnea, SaO2 87% on room air walking, 89% after 2 min seated rest  Stairs            Wheelchair Mobility    Modified Rankin (Stroke Patients Only)       Balance Overall balance assessment: Modified Independent                                           Pertinent Vitals/Pain Pain Assessment: No/denies pain    Home Living Family/patient expects to be discharged to:: Private residence Living Arrangements: Children Available Help at Discharge: Family;Available 24 hours/day   Home Access: Stairs to enter Entrance Stairs-Rails: Right Entrance Stairs-Number of Steps: 5 Home Layout: One level Home Equipment: Walker - 2 wheels;Shower seat      Prior Function Level of Independence: Independent with assistive device(s)         Comments: walks independently with RW, daughter supervises bathing, pt sits in shower to bathe; had home O2 but stopped using it bc it "dried her out"     Hand Dominance        Extremity/Trunk Assessment   Upper Extremity Assessment Upper Extremity Assessment: Overall WFL for tasks assessed    Lower Extremity Assessment Lower Extremity Assessment: Overall WFL for tasks assessed    Cervical / Trunk Assessment Cervical / Trunk Assessment: Kyphotic  Communication   Communication: No difficulties  Cognition  Arousal/Alertness: Awake/alert Behavior During Therapy: WFL for tasks assessed/performed Overall Cognitive Status: Within Functional Limits for tasks assessed                                        General Comments      Exercises     Assessment/Plan    PT Assessment Patient needs continued PT services  PT Problem List Cardiopulmonary status limiting activity;Decreased mobility;Decreased activity tolerance       PT Treatment  Interventions Gait training;Therapeutic activities;Therapeutic exercise    PT Goals (Current goals can be found in the Care Plan section)  Acute Rehab PT Goals Patient Stated Goal: return to prior level of function PT Goal Formulation: With patient/family Time For Goal Achievement: 08/23/17 Potential to Achieve Goals: Fair    Frequency Min 3X/week   Barriers to discharge        Co-evaluation               AM-PAC PT "6 Clicks" Daily Activity  Outcome Measure Difficulty turning over in bed (including adjusting bedclothes, sheets and blankets)?: A Little Difficulty moving from lying on back to sitting on the side of the bed? : A Little Difficulty sitting down on and standing up from a chair with arms (e.g., wheelchair, bedside commode, etc,.)?: A Little Help needed moving to and from a bed to chair (including a wheelchair)?: A Little Help needed walking in hospital room?: A Little Help needed climbing 3-5 steps with a railing? : A Lot 6 Click Score: 17    End of Session Equipment Utilized During Treatment: Gait belt Activity Tolerance: Patient limited by fatigue Patient left: Other (comment);with call bell/phone within reach;with family/visitor present(on toilet) Nurse Communication: Mobility status PT Visit Diagnosis: Difficulty in walking, not elsewhere classified (R26.2)    Time: 1610-9604 PT Time Calculation (min) (ACUTE ONLY): 24 min   Charges:   PT Evaluation $PT Eval Low Complexity: 1 Low PT Treatments $Gait Training: 8-22 mins   PT G Codes:          Tamala Ser 08/09/2017, 11:09 AM [phone]

## 2017-08-09 NOTE — Progress Notes (Signed)
Pt c/o knees aching, tedhose removed and Tylendol given, instant relief will cont to monitor. SRP,RN

## 2017-08-09 NOTE — Progress Notes (Signed)
1000--[facility] [place]--RN GIP Visit  This is a related and covered GIP admission of 08/06/2017 with [facility] diagnosis of Aortic Stenosis per Dr. Jema FuFeldmann. Code status is DNR. [facility] and admitted for increased edema not responding to treatment at home.  Visited with patient and daughter at bedside. [name] is alert and oriented. She denies any pain but feels increased SOB and wheezing today. She reports she does not feel as well as she did yesterday. Edema is improved in left arm, present only in upper portion and primarily dependent edema. Leg swelling improved in lower portion of lower extremities but still present in thighs. Daughter reports she is ambulating to bathroom.  Current medications: Lasix 60 mg IV TID and Xopenex nebulizer solution 1.25 mg Q 6 hours prn. I spoke with Sophia, bedside RN, requesting nebulizer treatment for wheezing.   Will continue to follow while hospitalized and anticipate any discharge needs.  When patient is discharged, if ambulance transport is needed, please call [facility] for transport.  Thank you, Haynes Bastracy Ennis, RN [facility] Liaison [phone]  [facility] Liaisons are on AMION.

## 2017-08-09 NOTE — Progress Notes (Signed)
PROGRESS NOTE [facility]   Kara Cruz   ZOX:[id number] DOB: November 26, 1917  DOA: 08/06/2017 PCP: Jarome Matin, MD   Brief Narrative:  Kara Cruz is a 84 year old female with medical history of PAF, severe aortic stenosis, hypothyroidism and CHF who presented to the emergency department complaining about leg and left arm swelling and  SOB for 2 weeks prior to admission.  Upon ED evaluation patient was found to have BNP > 3K, chest x-ray with increasing vascular congestion and volume overload and a slightly elevated troponin.  Patient was admitted for CHF exacerbation and diuresis. Patient on home hospice PTA.   Subjective: Patient seen and examined her breathing slightly improved, now complaining of knee pain.  She has some accumulation of fluid over her knees due to TED hose.  Pain improved after Tylenol.  Diuresing well, weight is up today.  Assessment & Plan: Acute on chronic combine systolic and diastolic CHF - slow improve  EF 40-45% with incoordinated septal motion, Severe aortic stenosis.  BNP > 3K, CXR with increase in vascular congestion  Elevated troponin likely demand ischemia  Increase Lasix to 80 mg 3 times daily, renal function responded well Fluid restriction to 1.5 L Low salt diet  INO's and daily weights continue to be inaccurate, discussed with nursing staff to appropriately record and document this measurements.  Cards recommendations appreciated - no further workup needed  Worked with PT no further recommendations  Keep legs elevated   AKI on CKD stage III -continues to improve Cardiorenal syndrome - expect creatinine to continue to trend down with diuresis Monitor BMP daily   Severe Aortic stenosis  Conservative treatment - patient not candidate for surgical repair   Hypothyroidism   Nml TSH  Continue Synthroid   DVT prophylaxis: TED Hose  Code Status: DNR  Family Communication: Family at bedside  Disposition Plan: Home with hospice in the  next 2-3 days   Consultants:   Cardiology   Procedures:   ECHO   Antimicrobials:  None    Objective: Vitals:   08/09/17 0656 08/09/17 0722 08/09/17 1017 08/09/17 1105  BP: 126/65     Pulse: 65     Resp: (!) 21     Temp: 97.7 F (36.5 C)     TempSrc: Oral     SpO2: 98%  93% (!) 89%  Weight:  68.5 kg (151 lb 1.6 oz)    Height:        Intake/Output Summary (Last 24 hours) at 08/09/2017 1208 Last data filed at 08/09/2017 1100 Gross per 24 hour  Intake 780 ml  Output 2200 ml  Net -1420 ml   Filed Weights   08/07/17 0700 08/08/17 0701 08/09/17 0722  Weight: 67.5 kg (148 lb 13 oz) 66.8 kg (147 lb 3.2 oz) 68.5 kg (151 lb 1.6 oz)    Examination:  General: Mild anxious  Cardiovascular: RRR, S1/S2, loud systolic murmur Respiratory: Decreased breath sounds, bibasilar crackles, scattered expiratory wheezes  Abdominal: Soft nontender nondistended Extremities: Anasarca  Data Reviewed: I have personally reviewed following labs and imaging studies  CBC: Recent Labs  Lab 08/06/17 1710 08/07/17 0343  WBC 5.8 5.1  NEUTROABS 4.1  --   HGB 12.9 12.5  HCT 39.5 38.5  MCV 88.2 87.5  PLT 195 173   Basic Metabolic Panel: Recent Labs  Lab 08/06/17 1710 08/07/17 0343 08/08/17 0444 08/09/17 0413  NA 136 137 138 140  K 4.8 3.4* 3.4* 3.6  CL 101 103 105 107  CO2 25  23 25 25   GLUCOSE 114* 101* 92 93  BUN 61* 63* 64* 67*  CREATININE 2.27* 2.17* 2.02* 1.88*  CALCIUM 8.6* 8.5* 8.7* 8.5*  MG  --   --  2.5* 2.6*   GFR: Estimated Creatinine Clearance: 15.5 mL/min (A) (by C-G formula based on SCr of 1.88 mg/dL (H)). Liver Function Tests: Recent Labs  Lab 08/07/17 0343 08/08/17 0444  AST 24 24  ALT 12* 13*  ALKPHOS 110 116  BILITOT 1.0 1.4*  PROT 6.0* 6.3*  ALBUMIN 3.1* 3.2*   No results for input(s): LIPASE, AMYLASE in the last 168 hours. No results for input(s): AMMONIA in the last 168 hours. Coagulation Profile: No results for input(s): INR, PROTIME in the last  168 hours. Cardiac Enzymes: Recent Labs  Lab 08/06/17 2145 08/07/17 0343  TROPONINI 0.22* 0.23*   BNP (last 3 results) No results for input(s): PROBNP in the last 8760 hours. HbA1C: No results for input(s): HGBA1C in the last 72 hours. CBG: No results for input(s): GLUCAP in the last 168 hours. Lipid Profile: No results for input(s): CHOL, HDL, LDLCALC, TRIG, CHOLHDL, LDLDIRECT in the last 72 hours. Thyroid Function Tests: Recent Labs    08/06/17 2145  TSH 1.594   Anemia Panel: No results for input(s): VITAMINB12, FOLATE, FERRITIN, TIBC, IRON, RETICCTPCT in the last 72 hours. Sepsis Labs: No results for input(s): PROCALCITON, LATICACIDVEN in the last 168 hours.  No results found for this or any previous visit (from the past 240 hour(s)).    Radiology Studies: No results found.  Scheduled Meds: . calcium carbonate  3 tablet Oral Daily  . cholecalciferol  1,000 Units Oral Daily  . furosemide  60 mg Intravenous TID  . levothyroxine  150 mcg Oral QAC breakfast  . multivitamin  1 tablet Oral Daily  . potassium chloride  20 mEq Oral Daily  . sodium chloride flush  3 mL Intravenous Q12H   Continuous Infusions: . sodium chloride       LOS: 3 days   Time spent: Total of 35 minutes spent with pt, greater than 50% of which was spent in discussion of  treatment, counseling and coordination of care  Latrelle DodrillEdwin Silva, MD Pager: Text Page via [email or web address]   If 7PM-7AM, please contact night-coverage [email or web address] 08/09/2017, 12:08 PM

## 2017-08-10 ENCOUNTER — Inpatient Hospital Stay (HOSPITAL_COMMUNITY)

## 2017-08-10 LAB — COMPREHENSIVE METABOLIC PANEL
ALT: 10 U/L — ABNORMAL LOW (ref 14–54)
AST: 23 U/L (ref 15–41)
Albumin: 3 g/dL — ABNORMAL LOW (ref 3.5–5.0)
Alkaline Phosphatase: 102 U/L (ref 38–126)
Anion gap: 8 (ref 5–15)
BILIRUBIN TOTAL: 1 mg/dL (ref 0.3–1.2)
BUN: 72 mg/dL — AB (ref 6–20)
CHLORIDE: 107 mmol/L (ref 101–111)
CO2: 24 mmol/L (ref 22–32)
Calcium: 8.7 mg/dL — ABNORMAL LOW (ref 8.9–10.3)
Creatinine, Ser: 1.92 mg/dL — ABNORMAL HIGH (ref 0.44–1.00)
GFR, EST AFRICAN AMERICAN: 24 mL/min — AB (ref >60)
GFR, EST NON AFRICAN AMERICAN: 21 mL/min — AB (ref >60)
Glucose, Bld: 102 mg/dL — ABNORMAL HIGH (ref 65–99)
POTASSIUM: 3.7 mmol/L (ref 3.5–5.1)
Sodium: 139 mmol/L (ref 135–145)
TOTAL PROTEIN: 6.3 g/dL — AB (ref 6.5–8.1)

## 2017-08-10 LAB — CBC
HEMATOCRIT: 37 % (ref 36.0–46.0)
Hemoglobin: 11.7 g/dL — ABNORMAL LOW (ref 12.0–15.0)
MCH: 27.7 pg (ref 26.0–34.0)
MCHC: 31.6 g/dL (ref 30.0–36.0)
MCV: 87.7 fL (ref 78.0–100.0)
PLATELETS: 184 10*3/uL (ref 150–400)
RBC: 4.22 MIL/uL (ref 3.87–5.11)
RDW: 14.9 % (ref 11.5–15.5)
WBC: 5 10*3/uL (ref 4.0–10.5)

## 2017-08-10 MED ORDER — BENZONATATE 100 MG PO CAPS
100.0000 mg | ORAL_CAPSULE | Freq: Two times a day (BID) | ORAL | Status: DC | PRN
  Administered 2017-08-10: 100 mg via ORAL
  Filled 2017-08-10: qty 1

## 2017-08-10 MED ORDER — METHYLPREDNISOLONE SODIUM SUCC 125 MG IJ SOLR
60.0000 mg | Freq: Three times a day (TID) | INTRAMUSCULAR | Status: DC
  Administered 2017-08-10 – 2017-08-11 (×4): 60 mg via INTRAVENOUS
  Filled 2017-08-10 (×4): qty 2

## 2017-08-10 NOTE — Progress Notes (Signed)
0900 -- [facility] [place] -- GIP RN Visit  This is a related and covered GIP admission of 08/06/2017 with [facility] diagnosis of Aortic Stenosis per Dr. Karie Georges. Code status is DNR. Patient was transported to [facility] and admitted for increased edema not responding to treatment at home.  Visited with patient in the room. No family present. Met daughter in the hallway as she was arriving after visiting with patient and updated her to the patients night. Ms. Ruybal reports she did not have a good night. She states she had a very bad cough. This morning she is tachypneic and wheezing. She states the breathing treatment she had yesterday helped significantly. I have asked the bedside RN if pt could receive another breathing treatment now, and will discuss with Dr. Quincy Simmonds if pt may benefit from scheduled treatments. Dpendent edema in left arm is still present in the upper portion. Legs were wrapped yesterday with TED hose and elevated and look significantly improved today. Pt has been using a BSC with assistance.  Current medications: Lasix 80 mg IV TID and Xopenex nebulizer solution 1.25 mg Q 6 hours prn (she received 2 doses yesterday 08/09/17).  Will continue to follow while hospitalized and anticipate discharge needs.  When patient is discharged, if ambulance transport is needed, please call [facility] for transport.  Thank you, Margaretmary Eddy, RN, [place] [facility] Liaison [phone]  [place] are on AMION.

## 2017-08-10 NOTE — Progress Notes (Addendum)
PROGRESS NOTE [facility]   Kara Cruz   EXB:[id number] DOB: [birth year]  DOA: 08/06/2017 PCP: Jarome Matin, MD   Brief Narrative:  Kara Cruz is a 84 year old female with medical history of PAF, severe aortic stenosis, hypothyroidism and Chronic HFREF 40-45% who presented to ED [facility] with complaints of bilateral LE and left arm swelling associated with dyspnea at rest. Previously on O2 supplement prn which she stopped taking around September 2018 due to nasal discomfort. Chest x-ray with increasing vascular congestion and volume overload.  Patient was admitted for acute CHF exacerbation.  Subjective: No acute events reported overnight. Seen and examined with daughter at her bedside. Reports dyspnea that is persistent at rest and productive cough with white/yellow sputum. Work of breathing noted during this evaluation. O2 supplement increased to 4L from 2L [place]. Denies chest pain.  Assessment & Plan: Acute on chronic combined systolic and diastolic CHF - slow improvement  EF 40-45% with incoordinated septal motion, Severe aortic stenosis.  BNP > 3K, CXR with increase in vascular congestion  Elevated troponin likely demand ischemia peaked at 0.23 IV Lasix 80 mg 3 times daily, renal function responded well Fluid restriction to 1.5 L Low salt diet  Strict I&O's and daily weights Cardiology consulted and following Recommendations appreciated - no further workup needed  Worked with PT recommendations for [facility] PT cxr 08/10/17 findings consistent w CHF w suspected small b/l pleural effusions IV solumedrol 60 mg TID for wheezing started 08/10/17  AKI on CKD stage III -continues to improve Cardiorenal syndrome - expect creatinine to continue to trend down with diuresis Good urine output Cr 1.92 form 1.88 from 2.02 Avoid nephrotoxic agents/hypotension Monitor BMP daily   Left upper extremity non pitting edema Duplex U/S negative for DVT 08/07/17 Possibly associated with dependent  edema Ambulate as tolerated  Severe Aortic stenosis  Conservative treatment - patient not candidate for surgical repair   Hypothyroidism   Nml TSH  Continue Synthroid   DVT prophylaxis: TED Hose  Code Status: DNR  Family Communication: daughter at bedside. All questions answered to her satisfaction  Disposition Plan: Home with hospice in the next 2-3 days   Consultants:   Cardiology   Procedures:   ECHO 08/07/17 with LVEF 40-45%  Antimicrobials:  None    Objective: Vitals:   08/09/17 2154 08/09/17 2218 08/10/17 0147 08/10/17 0656  BP:  128/62  128/74  Pulse:  75  67  Resp:  18  20  Temp:  98 F (36.7 C)  97.9 F (36.6 C)  TempSrc:  Oral  Oral  SpO2: 98% 94% 97% 98%  Weight:   67.7 kg (149 lb 3.2 oz)   Height:        Intake/Output Summary (Last 24 hours) at 08/10/2017 0835 Last data filed at 08/10/2017 0600 Gross per 24 hour  Intake 945 ml  Output 1350 ml  Net -405 ml   Filed Weights   08/08/17 0701 08/09/17 0722 08/10/17 0147  Weight: 66.8 kg (147 lb 3.2 oz) 68.5 kg (151 lb 1.6 oz) 67.7 kg (149 lb 3.2 oz)    Examination:  General: 97 CF WD WN NAD  Cardiovascular: RRR, S1/S2, loud systolic murmur Respiratory: Decreased breath sounds, bibasilar crackles, scattered expiratory wheezes  Abdominal: Soft nontender nondistended NBS x4 Extremities: left upper extremity non pitting edema. Trace edema in LE bilaterally.  Data Reviewed: I have personally reviewed following labs and imaging studies  CBC: Recent Labs  Lab 08/06/17 1710 08/07/17 0343 08/10/17 0556  WBC  5.8 5.1 5.0  NEUTROABS 4.1  --   --   HGB 12.9 12.5 11.7*  HCT 39.5 38.5 37.0  MCV 88.2 87.5 87.7  PLT 195 173 184   Basic Metabolic Panel: Recent Labs  Lab 08/06/17 1710 08/07/17 0343 08/08/17 0444 08/09/17 0413 08/10/17 0556  NA 136 137 138 140 139  K 4.8 3.4* 3.4* 3.6 3.7  CL 101 103 105 107 107  CO2 25 23 25 25 24   GLUCOSE 114* 101* 92 93 102*  BUN 61* 63* 64* 67* 72*    CREATININE 2.27* 2.17* 2.02* 1.88* 1.92*  CALCIUM 8.6* 8.5* 8.7* 8.5* 8.7*  MG  --   --  2.5* 2.6*  --    GFR: Estimated Creatinine Clearance: 15.1 mL/min (A) (by C-G formula based on SCr of 1.92 mg/dL (H)). Liver Function Tests: Recent Labs  Lab 08/07/17 0343 08/08/17 0444 08/10/17 0556  AST 24 24 23   ALT 12* 13* 10*  ALKPHOS 110 116 102  BILITOT 1.0 1.4* 1.0  PROT 6.0* 6.3* 6.3*  ALBUMIN 3.1* 3.2* 3.0*   No results for input(s): LIPASE, AMYLASE in the last 168 hours. No results for input(s): AMMONIA in the last 168 hours. Coagulation Profile: No results for input(s): INR, PROTIME in the last 168 hours. Cardiac Enzymes: Recent Labs  Lab 08/06/17 2145 08/07/17 0343  TROPONINI 0.22* 0.23*   BNP (last 3 results) No results for input(s): PROBNP in the last 8760 hours. HbA1C: No results for input(s): HGBA1C in the last 72 hours. CBG: No results for input(s): GLUCAP in the last 168 hours. Lipid Profile: No results for input(s): CHOL, HDL, LDLCALC, TRIG, CHOLHDL, LDLDIRECT in the last 72 hours. Thyroid Function Tests: No results for input(s): TSH, T4TOTAL, FREET4, T3FREE, THYROIDAB in the last 72 hours. Anemia Panel: No results for input(s): VITAMINB12, FOLATE, FERRITIN, TIBC, IRON, RETICCTPCT in the last 72 hours. Sepsis Labs: No results for input(s): PROCALCITON, LATICACIDVEN in the last 168 hours.  No results found for this or any previous visit (from the past 240 hour(s)).    Radiology Studies: No results found.  Scheduled Meds: . calcium carbonate  3 tablet Oral Daily  . cholecalciferol  1,000 Units Oral Daily  . furosemide  80 mg Intravenous TID  . levothyroxine  150 mcg Oral QAC breakfast  . multivitamin  1 tablet Oral Daily  . potassium chloride  20 mEq Oral Daily  . sodium chloride flush  3 mL Intravenous Q12H   Continuous Infusions: . sodium chloride       LOS: 4 days   Time spent: Total of 35 minutes spent with pt, greater than 50% of which was  spent in discussion of  treatment, counseling and coordination of care  Darlin Droparole N Goldie Tregoning, MD Pager: Text Page via [email or web address]   If 7PM-7AM, please contact night-coverage [email or web address] 08/10/2017, 8:35 AM

## 2017-08-11 DIAGNOSIS — I4430 Unspecified atrioventricular block: Secondary | ICD-10-CM

## 2017-08-11 DIAGNOSIS — N179 Acute kidney failure, unspecified: Secondary | ICD-10-CM

## 2017-08-11 DIAGNOSIS — N183 Chronic kidney disease, stage 3 (moderate): Secondary | ICD-10-CM

## 2017-08-11 DIAGNOSIS — I5023 Acute on chronic systolic (congestive) heart failure: Secondary | ICD-10-CM

## 2017-08-11 DIAGNOSIS — E039 Hypothyroidism, unspecified: Secondary | ICD-10-CM

## 2017-08-11 DIAGNOSIS — I480 Paroxysmal atrial fibrillation: Secondary | ICD-10-CM

## 2017-08-11 DIAGNOSIS — I350 Nonrheumatic aortic (valve) stenosis: Secondary | ICD-10-CM

## 2017-08-11 LAB — BASIC METABOLIC PANEL
Anion gap: 9 (ref 5–15)
BUN: 71 mg/dL — ABNORMAL HIGH (ref 6–20)
CO2: 24 mmol/L (ref 22–32)
CREATININE: 1.87 mg/dL — AB (ref 0.44–1.00)
Calcium: 8.9 mg/dL (ref 8.9–10.3)
Chloride: 107 mmol/L (ref 101–111)
GFR calc Af Amer: 25 mL/min — ABNORMAL LOW (ref >60)
GFR calc non Af Amer: 21 mL/min — ABNORMAL LOW (ref >60)
GLUCOSE: 153 mg/dL — AB (ref 65–99)
Potassium: 3.8 mmol/L (ref 3.5–5.1)
Sodium: 140 mmol/L (ref 135–145)

## 2017-08-11 LAB — CBC
HEMATOCRIT: 37.7 % (ref 36.0–46.0)
Hemoglobin: 12 g/dL (ref 12.0–15.0)
MCH: 28 pg (ref 26.0–34.0)
MCHC: 31.8 g/dL (ref 30.0–36.0)
MCV: 88.1 fL (ref 78.0–100.0)
Platelets: 168 10*3/uL (ref 150–400)
RBC: 4.28 MIL/uL (ref 3.87–5.11)
RDW: 15 % (ref 11.5–15.5)
WBC: 3.7 10*3/uL — ABNORMAL LOW (ref 4.0–10.5)

## 2017-08-11 MED ORDER — METHYLPREDNISOLONE SODIUM SUCC 125 MG IJ SOLR
60.0000 mg | Freq: Two times a day (BID) | INTRAMUSCULAR | Status: DC
  Administered 2017-08-12 (×2): 60 mg via INTRAVENOUS
  Filled 2017-08-11 (×2): qty 2

## 2017-08-11 MED ORDER — METOLAZONE 2.5 MG PO TABS
2.5000 mg | ORAL_TABLET | Freq: Every day | ORAL | Status: AC
  Administered 2017-08-11 – 2017-08-13 (×3): 2.5 mg via ORAL
  Filled 2017-08-11 (×3): qty 1

## 2017-08-11 NOTE — Progress Notes (Signed)
PROGRESS NOTE [facility]   Kara Cruz   ZOX:[id number] DOB: Dec 18, 1917  DOA: 08/06/2017 PCP: Jarome Matin, MD   Brief Narrative:  Kara Cruz is a 84 year old female with medical history of PAF, severe aortic stenosis, hypothyroidism and Chronic HFREF 40-45% who presented to ED [facility] with complaints of bilateral LE and left arm swelling associated with dyspnea at rest. Previously on O2 supplement prn which she stopped taking around September 2018 due to nasal discomfort. Chest x-ray with increasing vascular congestion and volume overload.  Patient was admitted for acute CHF exacerbation.  Subjective: Afebrile, denies CP. Some improvement in her breathing, but still requiring 4L [place] supplementation and with findings of fluid overload.    Assessment & Plan: Acute on chronic combined systolic and diastolic CHF - slow improvement  -EF 40-45% with incoordinated septal motion and Severe aortic stenosis.  -BNP > 3K, CXR with increase intra-vascular congestion  -continue IV lasix and add metolazone -continue low sodium diet, daily weight, fluid restriction and strict I's and O's -Recommendations from cardiology appreciated - no further workup needed. -Worked with PT recommendations for HHPT with 24 supervision; unfortunately lack of proper support at home in current conditions. Will like short term SNF for rehabilitation.  -cxr 08/10/17 findings consistent w CHF w suspected small b/l pleural effusions -follow clinical response   PAF/AV heart block -rate controlled -no anticoagulation candidate -CHADsVASC score 4 -s/p pacemaker   AKI on CKD stage III -continues to improve -Cardiorenal syndrome - expect creatinine to continue to trend down with diuresis -Cr 1.87 from 2.02 -will minimize/ avoid nephrotoxic agents -follow BMET  Left upper extremity non pitting edema -Duplex U/S negative for DVT 08/07/17 -Possibly associated with 3rd spacing  -continue treatment with physical  measures and diuretics.  Severe Aortic stenosis  -Conservative treatment - patient not candidate for surgical repair   Hypothyroidism   -TSH normal limits -continue synthroid.  Reactive airway disease/bronchospasm  -continue steroids tapering and nebulizer treatment   DVT prophylaxis: TED Hose  Code Status: DNR  Family Communication: daughter at bedside. All questions answered to her satisfaction  Disposition Plan: patient expressed interest going to SNF short term to try to regain her strength. Still with fluid overloads and positive wheezing on exam.     Consultants:   Cardiology   Procedures:   ECHO 08/07/17 with LVEF 40-45%  Antimicrobials:  None    Objective: Vitals:   08/10/17 0656 08/10/17 1402 08/10/17 2153 08/11/17 0525  BP: 128/74 140/69 115/62 129/61  Pulse: 67 60 70 75  Resp: 20 20 20 20   Temp: 97.9 F (36.6 C) 98.1 F (36.7 C) 98.2 F (36.8 C) 97.7 F (36.5 C)  TempSrc: Oral Oral Oral Oral  SpO2: 98% 98% 97% 95%  Weight:    66.8 kg (147 lb 3.2 oz)  Height:        Intake/Output Summary (Last 24 hours) at 08/11/2017 2104 Last data filed at 08/11/2017 1900 Gross per 24 hour  Intake 960 ml  Output 1725 ml  Net -765 ml   Filed Weights   08/09/17 0722 08/10/17 0147 08/11/17 0525  Weight: 68.5 kg (151 lb 1.6 oz) 67.7 kg (149 lb 3.2 oz) 66.8 kg (147 lb 3.2 oz)    Examination: General: afebrile, still SOB and with signs of fluid overload on exam. Cardiovascular: RRR, S1 and S2, positive SEM, no rubs, no gallops.  Respiratory: fair air movement, positive bibasilar crackles and mild exp wheezing. Patient wearing 4L [place] oxygen supplementation.  Abdominal:  soft, NT, ND, positive BS Extremities: some improvement in her LUE swelling; patient wrapping dressing in her legs; 2++ edema on her thighs bilaterally.  Data Reviewed: I have personally reviewed following labs and imaging studies  CBC: Recent Labs  Lab 08/06/17 1710 08/07/17 0343 08/10/17 0556  08/11/17 0450  WBC 5.8 5.1 5.0 3.7*  NEUTROABS 4.1  --   --   --   HGB 12.9 12.5 11.7* 12.0  HCT 39.5 38.5 37.0 37.7  MCV 88.2 87.5 87.7 88.1  PLT 195 173 184 168   Basic Metabolic Panel: Recent Labs  Lab 08/07/17 0343 08/08/17 0444 08/09/17 0413 08/10/17 0556 08/11/17 0450  NA 137 138 140 139 140  K 3.4* 3.4* 3.6 3.7 3.8  CL 103 105 107 107 107  CO2 23 25 25 24 24   GLUCOSE 101* 92 93 102* 153*  BUN 63* 64* 67* 72* 71*  CREATININE 2.17* 2.02* 1.88* 1.92* 1.87*  CALCIUM 8.5* 8.7* 8.5* 8.7* 8.9  MG  --  2.5* 2.6*  --   --    GFR: Estimated Creatinine Clearance: 15.4 mL/min (A) (by C-G formula based on SCr of 1.87 mg/dL (H)).   Liver Function Tests: Recent Labs  Lab 08/07/17 0343 08/08/17 0444 08/10/17 0556  AST 24 24 23   ALT 12* 13* 10*  ALKPHOS 110 116 102  BILITOT 1.0 1.4* 1.0  PROT 6.0* 6.3* 6.3*  ALBUMIN 3.1* 3.2* 3.0*   Cardiac Enzymes: Recent Labs  Lab 08/06/17 2145 08/07/17 0343  TROPONINI 0.22* 0.23*    Radiology Studies: Dg Chest Port 1 View  Result Date: 08/10/2017 CLINICAL DATA:  Increased edema, hypoxia, cough EXAM: PORTABLE CHEST 1 VIEW COMPARISON:  08/06/2017 FINDINGS: There is bilateral diffuse interstitial thickening and prominence of the central pulmonary vasculature. There is a small right pleural effusion. There is a trace left pleural effusion. There is no focal consolidation. There is no pneumothorax. There is stable cardiomegaly. There is thoracic aortic atherosclerosis. There is a dual lead cardiac pacemaker. The osseous structures are unremarkable. IMPRESSION: 1. Findings consistent with CHF. Electronically Signed   By: Elige KoHetal  Patel   On: 08/10/2017 12:36    Scheduled Meds: . calcium carbonate  3 tablet Oral Daily  . cholecalciferol  1,000 Units Oral Daily  . furosemide  80 mg Intravenous TID  . levothyroxine  150 mcg Oral QAC breakfast  . [START ON 08/12/2017] methylPREDNISolone (SOLU-MEDROL) injection  60 mg Intravenous Q12H  .  metolazone  2.5 mg Oral Daily  . multivitamin  1 tablet Oral Daily  . potassium chloride  20 mEq Oral Daily  . sodium chloride flush  3 mL Intravenous Q12H   Continuous Infusions: . sodium chloride       LOS: 5 days   Time spent: Total of 35 minutes spent with pt, greater than 50% of which was spent in discussion of  treatment, counseling and coordination of care  Kara Lollarlos Daisa Stennis, MD Pager: [phone]  If 7PM-7AM, please contact night-coverage [email or web address] 08/11/2017, 9:04 PM

## 2017-08-11 NOTE — Care Management Note (Signed)
Case Management Note  Patient Details  Name: Kara Cruz MRN: [id number] Date of Birth: 10/17/1917  Subjective/Objective:  Pt admitted with Edema                  Action/Plan:Pt discharging with [facility]. Pt would like a list of foods that she can eat.  Pt would love to have Peanut Butter.  There are no other needs.    Expected Discharge Date:  08/09/17               Expected Discharge Plan:  Home w Hospice Care([facility] pt)  In-House Referral:     Discharge planning Services  CM Consult  Post Acute Care Choice:    Choice offered to:     DME Arranged:    DME Agency:     HH Arranged:    HH Agency:     Status of Service:  Completed, signed off  If discussed at [employer] Meetings, dates discussed:    Additional CommentsGeni Bers:  Kameron Glazebrook, RN 08/11/2017, 2:58 PM

## 2017-08-11 NOTE — Progress Notes (Signed)
0830-- [facility] -- GIP RN Visit  This is a related and covered GIP admission of 08/06/2017 with [facility] diagnosis of Aortic Stenosis per Dr. Adriannah FuFeldmann. Code status is DNR. Patient was [facility] and admitted for increased edema not responding to treatment at home.  Visited patient at bedside, daughter, Britta MccreedyBarbara also present. NA was assisting patient up to chair.  She reports having a better night last night. Continues to have edema in left arm and upper legs but it has decreased since admission.  Ted hose were not comfortable so ace wraps were applied yesterday and this was tolerated well.  She is receiving O2 4Lnc. Daughter reports cough and wheezing have improved significantly with nebulizer treatments. Pt has been using a BSC with assistance.   Current Medications:  Lasix 80mg  IV TID, Solumedrol 60mg  TID and Xopenex nebulizer 1.25mg  PRN Q6hrs last given 08/10/2016 @09 :38   Will continue to follow while hospitalized and anticipate discharge needs.  When patient is discharged, if ambulance transport is needed, please call [facility] for transport.  Thank you,  Charlynn CourtMary Zania Robertson,RN, [facility] Hospital Liaison [phone]  [facility] Liaisons are on AMION

## 2017-08-12 MED ORDER — METHYLPREDNISOLONE SODIUM SUCC 40 MG IJ SOLR
40.0000 mg | Freq: Every day | INTRAMUSCULAR | Status: DC
  Administered 2017-08-13: 40 mg via INTRAVENOUS
  Filled 2017-08-12: qty 1

## 2017-08-12 NOTE — Progress Notes (Addendum)
[facility] 1433 - [facility] - GIP RN visit at 0855  This is a related and covered GIP admission of 08/06/2017 with [facility] diagnosis of Aortic Stenosis per Dr. Nimah FuFeldmann. Code status is DNR. Patient was [facility] to [facility] and admitted for increased edema not responding to treatment at home.  Visited with patient and daughter at bedside this morning.  PT was working with patient and patient was ambulating with walker assist on the unit.   Patient alert and oriented.  Patient states she is feeling much better today.  Patient did eat approximately 75 % of breakfast this morning.  Patient ambulated then sat in chair with legs up.  Patient continues to have swelling to L arm and bilateral upper legs.  Patient states "its better than it was".  Patient states to me this morning that she prefers to go to SNF with rehab from the hospital.  She had discussed with Dr. Gwenlyn PerkingMadera yesterday.   I spoke with Cookie, CMRN, and advised of same.  She will let social work know and [facility] will continue to follow patient at this time.  Patient and daughter both agreed that she wants to revoke hospice services while in rehab, then come back to service once it is complete.  I contacted [facility] CSW, Kirt BoysMolly, who will come and talk with patient/daughter about revocation of services.  Patient will possibly be discharged on 08/13/17, per bedside RN.    Patient receiving:  Furosemide (LASIX) injection 80 mg, Dose 80 mg, TID via IV; methylPREDNISolone sodium succinate (SOLU-MEDROL) 125 mg/2 mL injection 60 mg, Dose: 60 mg, Q12H via IV.  Continuous medication:  0.9 % sodium chloride infusion, 250 mL, PRN via IV.  Patient has received no PRN medications today, thus far.    Will continue to follow while hospitalized and anticipate discharge needs.  Thank you,  Adele BarthelAmy Evans, RN, BSN [facility] Hospital Liaison [phone]  **UPDATE:  PT didn't recommend SNF with rehab.  They recommended PT at home.   Patient/daughter have decided they would like to pursue PT and home health, but will discuss and make final decision when liaison makes a visit tomorrow.  [facility] team has been notified.  I will follow up with patient/daughter tomorrow to clarify what they would like to do.  If patient/daughter decides to stay with [facility] services, O2 concentrator will have to be ordered for home, as well as portable O2 for transport.  (Patient is requesting humidification).[facility]** [facility] Hospital Liaisons are on AMION

## 2017-08-12 NOTE — Progress Notes (Signed)
PROGRESS NOTE [facility]   Kara Cruz   ZOX:[id number] DOB: 21-Apr-1918  DOA: 08/06/2017 PCP: Jarome Matin, MD   Brief Narrative:  Kara Cruz is a 84 year old female with medical history of PAF, severe aortic stenosis, hypothyroidism and Chronic HFREF 40-45% who presented to ED [facility] with complaints of bilateral LE and left arm swelling associated with dyspnea at rest. Previously on O2 supplement prn which she stopped taking around September 2018 due to nasal discomfort. Chest x-ray with increasing vascular congestion and volume overload.  Patient was admitted for acute CHF exacerbation.  Subjective: Afebrile, denies CP. Continue to improve breathing and volume status. Still using [place] oxygen supplementation. Will might arranged for O2 24/7 at discharge.  Assessment & Plan: Acute on chronic combined systolic and diastolic CHF - slow improvement  -EF 40-45% with incoordinated septal motion and Severe aortic stenosis.  -BNP > 3K, CXR with increase intra-vascular congestion  -will continue IV lasix and metolazone -continue low sodium diet, daily weight, fluid restriction and strict I's and O's. Long discussion and education provided about sodium intake. -Recommendations from cardiology appreciated - no further workup needed. -Worked with PT again and improved walking capacity; PT recommending HHPT now..  -cxr 08/10/17 findings consistent w CHF w suspected small b/l pleural effusions -follow clinical response   PAF/AV heart block -rate controlled -no anticoagulation candidate -CHADsVASC score 4 -s/p pacemaker  -continue monitoring on telemetry   AKI on CKD stage III -continues to improve -Cardiorenal syndrome - expect creatinine to continue to trend down with diuresis -Cr 1.87 from 2.02, last checked -will continue to minimize/avoid nephrotoxic agents -follow BMET  Left upper extremity non pitting edema -Duplex U/S negative for DVT 08/07/17 -Possibly associated with 3rd  spacing  -continue limb elevation measures and diuretics.  Severe Aortic stenosis  -Conservative treatment - patient not candidate for surgical repair   Hypothyroidism   -TSH normal limits -continue synthroid.  Reactive airway disease/bronchospasm  -continue steroids tapering and nebulizer treatment   DVT prophylaxis: TED Hose  Code Status: DNR  Family Communication: daughter at bedside. All questions answered to her satisfaction  Disposition Plan: patient has improved her mobility and qualifies now for HHPT instead of SNF.      Consultants:   Cardiology   Procedures:   ECHO 08/07/17 with LVEF 40-45%  Antimicrobials:  None    Objective: Vitals:   08/11/17 0525 08/11/17 2159 08/12/17 0701 08/12/17 1545  BP: 129/61 120/75 129/74 122/70  Pulse: 75 71 70 71  Resp: 20 20 20 20   Temp: 97.7 F (36.5 C) 97.7 F (36.5 C) 97.9 F (36.6 C) 97.6 F (36.4 C)  TempSrc: Oral Oral Oral Oral  SpO2: 95% 99% 99% 98%  Weight: 66.8 kg (147 lb 3.2 oz)  66.8 kg (147 lb 4.8 oz)   Height:        Intake/Output Summary (Last 24 hours) at 08/12/2017 2305 Last data filed at 08/12/2017 2156 Gross per 24 hour  Intake 730 ml  Output 2601 ml  Net -1871 ml   Filed Weights   08/10/17 0147 08/11/17 0525 08/12/17 0701  Weight: 67.7 kg (149 lb 3.2 oz) 66.8 kg (147 lb 3.2 oz) 66.8 kg (147 lb 4.8 oz)    Examination: General: breathing better and denying CP, nausea, vomiting and abd pain. Patient participated with PT and was able to walk > 250 feet. Still with fluid overload on exam (even much improved) and requiring 4L [place]. Cardiovascular: RRR, S1 and S2, no rubs, no gallops.  Positive SEM, no frank JVD appreciated. Respiratory: improved air movement, no significant wheezing, fine crackles at bases bilaterally. Abdominal: soft, NT, ND, positive BS.  Extremities: overall swelling in her limbs continue improving. No cyanosis or clubbing.   Data Reviewed: I have personally reviewed following labs  and imaging studies  CBC: Recent Labs  Lab 08/06/17 1710 08/07/17 0343 08/10/17 0556 08/11/17 0450  WBC 5.8 5.1 5.0 3.7*  NEUTROABS 4.1  --   --   --   HGB 12.9 12.5 11.7* 12.0  HCT 39.5 38.5 37.0 37.7  MCV 88.2 87.5 87.7 88.1  PLT 195 173 184 168   Basic Metabolic Panel: Recent Labs  Lab 08/07/17 0343 08/08/17 0444 08/09/17 0413 08/10/17 0556 08/11/17 0450  NA 137 138 140 139 140  K 3.4* 3.4* 3.6 3.7 3.8  CL 103 105 107 107 107  CO2 23 25 25 24 24   GLUCOSE 101* 92 93 102* 153*  BUN 63* 64* 67* 72* 71*  CREATININE 2.17* 2.02* 1.88* 1.92* 1.87*  CALCIUM 8.5* 8.7* 8.5* 8.7* 8.9  MG  --  2.5* 2.6*  --   --    GFR: Estimated Creatinine Clearance: 15.4 mL/min (A) (by C-G formula based on SCr of 1.87 mg/dL (H)).   Liver Function Tests: Recent Labs  Lab 08/07/17 0343 08/08/17 0444 08/10/17 0556  AST 24 24 23   ALT 12* 13* 10*  ALKPHOS 110 116 102  BILITOT 1.0 1.4* 1.0  PROT 6.0* 6.3* 6.3*  ALBUMIN 3.1* 3.2* 3.0*   Cardiac Enzymes: Recent Labs  Lab 08/06/17 2145 08/07/17 0343  TROPONINI 0.22* 0.23*    Radiology Studies: No results found.  Scheduled Meds: . calcium carbonate  3 tablet Oral Daily  . cholecalciferol  1,000 Units Oral Daily  . furosemide  80 mg Intravenous TID  . levothyroxine  150 mcg Oral QAC breakfast  . methylPREDNISolone (SOLU-MEDROL) injection  60 mg Intravenous Q12H  . metolazone  2.5 mg Oral Daily  . multivitamin  1 tablet Oral Daily  . potassium chloride  20 mEq Oral Daily  . sodium chloride flush  3 mL Intravenous Q12H   Continuous Infusions: . sodium chloride       LOS: 6 days   Time spent: Total of 30 minutes spent with pt, greater than 50% of which was spent in discussion of  treatment, counseling and coordination of care  Vassie Lollarlos Damar Petit, MD Pager: [phone]  If 7PM-7AM, please contact night-coverage [email or web address] 08/12/2017, 11:05 PM

## 2017-08-12 NOTE — Progress Notes (Signed)
[facility]) Social Work (SW) note:  SW met with Pt/Kara Cruz. Pt stated she was feeling better, [name] reported Pt "was having a good day today." During this SW visit, hospital CSW came in the room and stated hospital PT recommended Pt return home with home healt follow-up as Pt walked 200 ft today with hospital PT.  Pt is under hospice care at this time, recommendation for home health PT will be discussed during hospice team meeting.  [place], [place]

## 2017-08-12 NOTE — Progress Notes (Signed)
Per chart review, patient interested in SNF for ST rehab. PT recommended home health PT. CSW spoke with patient and patient's daughter Britta Mccreedy([name]) at bedside with Hospice SW Kirt BoysMolly present, regarding discharge planning. CSW informed patient/patient's daughter that PT recommended Home Health PT. CSW explained to patient that if PT is recommending Home Health PT that Medicare would not pay for SNF, patient/patient's daughter verbalized understanding. Patient/patient's daughter reported that they are not interested in private paying for SNF. Patient/patient's daughter verbalized plan to discharge back home with hospice. [facility] [place] agreed to follow up with patient. CSW signing off, no other needs identified at this time.  Celso SickleKimberly Nashia Remus, [place] Clinical Social Worker [facility] Cell#: [phone]

## 2017-08-12 NOTE — Progress Notes (Signed)
Physical Therapy Treatment Patient Details Name: Kara Cruz MRN: [id number] DOB: [birth year] Today's Date: 08/12/2017    History of Present Illness 84 year old female with medical history of PAF, severe aortic stenosis, hypothyroidism and CHF who presented to the emergency department complaining about leg and left arm swelling and  SOB for 2 weeks prior to admission.  Upon ED evaluation patient was found to have BNP > 3K, chest x-ray with increasing vascular congestion and volume overload and a slightly elevated troponin.  Patient was admitted for CHF exacerbation and diuresis. Patient on home hospice PTA.     PT Comments    Marked improvement in activity tolerance noted with pt ambulating 200' in hall with RW and no LOB noted.   Follow Up Recommendations  Home health PT     Equipment Recommendations  None recommended by PT    Recommendations for Other Services       Precautions / Restrictions Precautions Precautions: Fall Precaution Comments: 1 fall in past year (Sept 2018 reached for something on bathroom floor) Restrictions Weight Bearing Restrictions: No    Mobility  Bed Mobility               General bed mobility comments: Pt up at EOB on arriveal to room  Transfers Overall transfer level: Needs assistance Equipment used: Rolling walker (2 wheeled) Transfers: Sit to/from Stand Sit to Stand: From elevated surface;Min guard         General transfer comment: min/guard for safety  Ambulation/Gait Ambulation/Gait assistance: Min guard Ambulation Distance (Feet): 200 Feet Assistive device: Rolling walker (2 wheeled) Gait Pattern/deviations: Step-through pattern;Trunk flexed     General Gait Details: cues for position from RW.  N LOB, mild SOB with exertion (O2@4L ).  Several brief standing rest breaks to complete task   Stairs            Wheelchair Mobility    Modified Rankin (Stroke Patients Only)       Balance                                             Cognition Arousal/Alertness: Awake/alert Behavior During Therapy: WFL for tasks assessed/performed Overall Cognitive Status: Within Functional Limits for tasks assessed                                        Exercises      General Comments        Pertinent Vitals/Pain Pain Assessment: No/denies pain    Home Living                      Prior Function            PT Goals (current goals can now be found in the care plan section) Acute Rehab PT Goals Patient Stated Goal: return to prior level of function PT Goal Formulation: With patient/family Time For Goal Achievement: 08/23/17 Potential to Achieve Goals: Fair Progress towards PT goals: Progressing toward goals    Frequency    Min 3X/week      PT Plan Current plan remains appropriate    Co-evaluation              AM-PAC PT "6 Clicks" Daily Activity  Outcome Measure  Difficulty turning over in bed (including  adjusting bedclothes, sheets and blankets)?: A Little Difficulty moving from lying on back to sitting on the side of the bed? : A Little Difficulty sitting down on and standing up from a chair with arms (e.g., wheelchair, bedside commode, etc,.)?: A Little Help needed moving to and from a bed to chair (including a wheelchair)?: A Little Help needed walking in hospital room?: A Little Help needed climbing 3-5 steps with a railing? : A Little 6 Click Score: 18    End of Session Equipment Utilized During Treatment: Gait belt;Oxygen Activity Tolerance: Patient tolerated treatment well Patient left: in chair;with call bell/phone within reach;with family/visitor present Nurse Communication: Mobility status PT Visit Diagnosis: Difficulty in walking, not elsewhere classified (R26.2)     Time: [id number] PT Time Calculation (min) (ACUTE ONLY): 24 min  Charges:  $Gait Training: 23-37 mins                    G Codes:       Pg [phone]  [id number]    Hudsyn Champine 08/12/2017, 9:53 AM

## 2017-08-13 DIAGNOSIS — I5043 Acute on chronic combined systolic (congestive) and diastolic (congestive) heart failure: Secondary | ICD-10-CM

## 2017-08-13 DIAGNOSIS — R0602 Shortness of breath: Secondary | ICD-10-CM

## 2017-08-13 DIAGNOSIS — N179 Acute kidney failure, unspecified: Secondary | ICD-10-CM

## 2017-08-13 DIAGNOSIS — R0902 Hypoxemia: Secondary | ICD-10-CM

## 2017-08-13 DIAGNOSIS — N183 Chronic kidney disease, stage 3 unspecified: Secondary | ICD-10-CM

## 2017-08-13 LAB — BASIC METABOLIC PANEL
Anion gap: 10 (ref 5–15)
BUN: 99 mg/dL — ABNORMAL HIGH (ref 6–20)
CALCIUM: 9.2 mg/dL (ref 8.9–10.3)
CO2: 26 mmol/L (ref 22–32)
CREATININE: 2.12 mg/dL — AB (ref 0.44–1.00)
Chloride: 101 mmol/L (ref 101–111)
GFR, EST AFRICAN AMERICAN: 21 mL/min — AB (ref >60)
GFR, EST NON AFRICAN AMERICAN: 18 mL/min — AB (ref >60)
Glucose, Bld: 124 mg/dL — ABNORMAL HIGH (ref 65–99)
Potassium: 3.8 mmol/L (ref 3.5–5.1)
SODIUM: 137 mmol/L (ref 135–145)

## 2017-08-13 MED ORDER — BENZONATATE 100 MG PO CAPS
100.0000 mg | ORAL_CAPSULE | Freq: Two times a day (BID) | ORAL | 0 refills | Status: DC | PRN
Start: 2017-08-13 — End: 2017-09-16

## 2017-08-13 MED ORDER — OMEPRAZOLE 20 MG PO CPDR: 20.0000 mg | DELAYED_RELEASE_CAPSULE | Freq: Every day | ORAL | 0 refills | Status: DC

## 2017-08-13 MED ORDER — METOLAZONE 5 MG PO TABS: 5.0000 mg | ORAL_TABLET | ORAL | 0 refills | Status: DC

## 2017-08-13 MED ORDER — PREDNISONE 10 MG PO TABS: ORAL_TABLET | ORAL | 0 refills | Status: DC

## 2017-08-13 MED ORDER — TORSEMIDE 20 MG PO TABS: 80.0000 mg | ORAL_TABLET | Freq: Two times a day (BID) | ORAL | 11 refills | Status: AC

## 2017-08-13 NOTE — Progress Notes (Signed)
D/c to home via w/c with dtr.Voices no c/o.

## 2017-08-13 NOTE — Discharge Summary (Signed)
Physician Discharge Summary  BILAN TEDESCO BJY:[id number] DOB: Sep 27, 1917 DOA: 08/06/2017  PCP: Jarome Matin, MD  Admit date: 08/06/2017 Discharge date: 08/13/2017  Time spent: 35 minutes  Recommendations for Outpatient Follow-up:  1. Repeat basic metabolic panel to follow electrolytes and renal function 2. Reassess patient volume status and further adjust diuretic therapy as needed  Discharge Diagnoses:  Principal Problem:   Edema Active Problems:   Hypothyroidism   Nonrheumatic aortic valve stenosis   Acute on chronic combined systolic and diastolic HF (heart failure) (HCC)   ARF (acute renal failure) (HCC)   Hypoxia   SOB (shortness of breath)   Acute renal failure superimposed on stage 3 chronic kidney disease (HCC)   Discharge Condition: Stable and improved.  Patient discharged home with home health physical therapy and home health nurse for attempted rehabilitation and to assess improvement/decline of her condition.  Patient instructed to follow-up with PCP in 10 days.  Diet recommendation: Low sodium diet (less than 2 g in 24 hours).  Filed Weights   08/11/17 0525 08/12/17 0701 08/13/17 0505  Weight: 66.8 kg (147 lb 3.2 oz) 66.8 kg (147 lb 4.8 oz) 65.9 kg (145 lb 3.2 oz)    History of present illness:  As per H&P written by Dr. Selena Batten on 08/06/17 84 year old female with medical history of PAF, severe aortic stenosis, hypothyroidism and Chronic HFREF 40-45% who presented to ED Kara Cruz with complaints of bilateral LE and left arm swelling associated with dyspnea at rest. Previously on O2 supplement prn which she stopped taking around September 2018 due to nasal discomfort. Chest x-ray with increasing vascular congestion and volume overload.  Patient was admitted for acute CHF exacerbation.  Cruz Course:  Acute on chronic combined systolic and diastolic CHF - -EF 40-45% with incoordinated septal motion and Severe aortic stenosis.  -BNP > 3K, CXR with increase intra-vascular  congestion  -will discharge on Demadex 80 mg twice a day and weekly use of metolazone to enhance diuretic treatment. -Patient instructed to check her weight on daily basis and to follow low-sodium diet. -Education regarding fluid restriction also provided  -Recommendations from cardiology appreciated - no further workup needed. -Patient discharged home with home health nurse and home health physical therapy for attempted rehabilitation. -At discharge no oxygen supplementation required.  PAF/AV heart block -rate controlled -no anticoagulation candidate -CHADsVASC score 4 -s/p pacemaker   AKI on CKD stage III  -overall stable at discharge -discussing with family they understand and ok sacrificing some of Cr number to better control volume while using diuretics -Cr 2.1 at discharge -repeat BMET at follow up -patient discharge on demadex 80mg  BID   Left upper extremity non pitting edema -Duplex U/S negative for DVT 08/07/17 -Possibly associated with 3rd spacing  -continue limb elevation measures and diuretics.  Severe Aortic stenosis  -Conservative treatment - patient not candidate for surgical repair   Hypothyroidism   -TSH normal limits -continue synthroid.  Reactive airway disease/bronchospasm/hypoxia -continue steroids tapering at discharge -No major wheezing appreciated -Continue PRN Tessalon Pearls for coughing spells.   -Oxygen supplementation required at the moment of discharge.  Procedures:  ECHO 08/07/17 with LVEF 40-45%   Consultations:  Cardiology  Discharge Exam: Vitals:   08/12/17 1545 08/13/17 0505  BP: 122/70 123/65  Pulse: 71 65  Resp: 20 20  Temp: 97.6 F (36.4 C) 97.8 F (36.6 C)  SpO2: 98% 99%    General: breathing much better and significantly improved.  Patient denies chest pain, nausea, vomiting, abdominal pain or  any acute distress currently.  No oxygen supplementation required at the time of discharge. Cardiovascular: RRR, S1 and  S2, no rubs, no gallops. Positive SEM, no frank JVD appreciated. Respiratory: improved air movement, no significant wheezing, no frank crackles at bases bilaterally.   Abdominal:  Soft, nontender, nondistended, positive bowel sounds.   Extremities: overall swelling in her limbs continue improving. No cyanosis or clubbing. Patient with 1-2+ edema up to her thighs bilaterally.   Discharge Instructions   Discharge Instructions    (HEART FAILURE PATIENTS) Call MD:  Anytime you have any of the following symptoms: 1) 3 pound weight gain in 24 hours or 5 pounds in 1 week 2) shortness of breath, with or without a dry hacking cough 3) swelling in the hands, feet or stomach 4) if you have to sleep on extra pillows at night in order to breathe.   Complete by:  As directed    Diet - low sodium heart healthy   Complete by:  As directed    Discharge instructions   Complete by:  As directed    Take medications as prescribed Follow low-sodium diet (goal is less than 2 g in 24 hours) Monitor fluid restriction (1.8 L daily) Check daily weights (monitor for weight gain of more than 3 pounds overnight or more than 5 pounds in a week). Arrange follow-up with your PCP in 10 days.     Allergies as of 08/13/2017      Reactions   Labetalol Swelling   Blisters around lips Swollen lips & eyes, blistering lips, dry mouth   Latex Itching   Moxifloxacin Other (See Comments)   Doesn't remember   Prednisone Nausea And Vomiting   Previously listed as SHOB, but verified with patient/daughter 08/10/17, only N/V as per [facility] medical record   Pseudoephedrine Hcl Other (See Comments)   Codeine Sulfate Other (See Comments)   REACTION: "out of body experience"--"floats"   Diphenhydramine Hcl Other (See Comments)   REACTION: headache, vertigo   Doxycycline Hyclate Other (See Comments)   REACTION: nausea, vomiting   Hibiclens [chlorhexidine] Rash   wipes      Medication List    TAKE these medications    benzonatate 100 MG capsule Commonly known as:  TESSALON Take 1 capsule (100 mg total) by mouth 2 (two) times daily as needed for cough.   calcium carbonate 750 MG chewable tablet Commonly known as:  TUMS EX Chew 2 tablets by mouth daily.   cholecalciferol 1000 units tablet Commonly known as:  VITAMIN D Take 1,000 Units by mouth daily.   levothyroxine 150 MCG tablet Commonly known as:  SYNTHROID, LEVOTHROID Take 1 tablet (150 mcg total) by mouth daily before breakfast.   Lutein 20 MG Tabs Take 20 mg by mouth daily.   meclizine 25 MG tablet Commonly known as:  ANTIVERT Take 25 mg by mouth daily as needed for dizziness (or vertigo).   metolazone 5 MG tablet Commonly known as:  ZAROXOLYN Take 1 tablet (5 mg total) by mouth once a week. On Wednesday   omeprazole 20 MG capsule Commonly known as:  PRILOSEC Take 1 capsule (20 mg total) by mouth daily for 20 days.   Potassium 99 MG Tabs Take 198 mg by mouth daily.   predniSONE 10 MG tablet Commonly known as:  DELTASONE Take 3 tablets by mouth daily times 1 day; then 2 tablets by mouth daily times 2 days; then 1 tablet by mouth daily X 3 days and stop prednisone.  PRESERVISION AREDS 2 Caps Take 2 tablets by mouth daily.   REFRESH OP Place 1 drop into both eyes at bedtime.   torsemide 20 MG tablet Commonly known as:  DEMADEX Take 4 tablets (80 mg total) by mouth 2 (two) times daily. What changed:  how much to take            Durable Medical Equipment  (From admission, onward)        Start     Ordered   08/13/17 1436  For home use only DME Bedside commode  Once    Question:  Patient needs a bedside commode to treat with the following condition  Answer:  Fear for personal safety   08/13/17 1435     Allergies  Allergen Reactions  . Labetalol Swelling    Blisters around lips Swollen lips & eyes, blistering lips, dry mouth  . Latex Itching  . Moxifloxacin Other (See Comments)    Doesn't remember  .  Prednisone Nausea And Vomiting    Previously listed as SHOB, but verified with patient/daughter 08/10/17, only N/V [facility]  . Pseudoephedrine Hcl Other (See Comments)  . Codeine Sulfate Other (See Comments)    REACTION: "out of body experience"--"floats"  . Diphenhydramine Hcl Other (See Comments)    REACTION: headache, vertigo  . Doxycycline Hyclate Other (See Comments)    REACTION: nausea, vomiting  . Hibiclens [Chlorhexidine] Rash    wipes   Follow-up Information    Jarome MatinPaterson, Daniel, MD. Schedule an appointment as soon as possible for a visit in 10 day(s).   Specialty:  Internal Medicine Contact information: [address] [place] [address] [phone]           The results of significant diagnostics from this hospitalization (including imaging, microbiology, ancillary and laboratory) are listed below for reference.    Significant Diagnostic Studies: Dg Chest 2 View  Result Date: 08/06/2017 CLINICAL DATA:  Shortness of breath, fluid overload. Evaluate for CHF. EXAM: CHEST  2 VIEW COMPARISON:  Chest x-ray dated 09/16/2014. FINDINGS: Cardiomegaly. Central pulmonary vascular congestion without overt alveolar pulmonary edema. Probable small bilateral pleural effusions. Aortic atherosclerosis. Left chest wall pacemaker/ICD appears stable in configuration. No acute or suspicious osseous finding. IMPRESSION: 1. Cardiomegaly with central pulmonary vascular congestion consistent with mild CHF/volume overload. No evidence of overt alveolar pulmonary edema. 2. Probable small bilateral pleural effusions. 3. Aortic atherosclerosis. Electronically Signed   By: Bary RichardStan  Maynard M.D.   On: 08/06/2017 17:45   [place] Renal  Result Date: 08/06/2017 CLINICAL DATA:  84 y/o  F; acute renal failure. EXAM: RENAL / URINARY TRACT ULTRASOUND COMPLETE COMPARISON:  None. FINDINGS: Right Kidney: Length: 13.9 cm. Thin echogenic cortex. 1.5 cm echogenic focus in lower pole with distal  acoustic shadowing, probably a stone. Upper pole simple cyst measuring 1.9 cm. Left Kidney: Left upper quadrant mass measuring 10.8 x 3.9 x 6.0 cm in the left renal fossa, no normal kidney identified. Bladder: Distended bladder with volume of 690 cc.  Small volume of ascites. IMPRESSION: 1. Left upper quadrant mass measuring up to 10.8 cm in the left renal fossa. No normal left kidney identified. CT or MRI recommended. 2. Atrophic right kidney with echogenic cortex compatible with medical renal disease. 3. Right kidney lower pole 1.5 cm stone and upper pole simple cyst. No hydronephrosis. Electronically Signed   By: Mitzi HansenLance  Furusawa-Stratton M.D.   On: 08/06/2017 21:10   Dg Chest Port 1 View  Result Date: 08/10/2017 CLINICAL DATA:  Increased edema, hypoxia, cough EXAM: PORTABLE CHEST 1 VIEW COMPARISON:  08/06/2017 FINDINGS: There is bilateral diffuse interstitial thickening and prominence of the central pulmonary vasculature. There is a small right pleural effusion. There is a trace left pleural effusion. There is no focal consolidation. There is no pneumothorax. There is stable cardiomegaly. There is thoracic aortic atherosclerosis. There is a dual lead cardiac pacemaker. The osseous structures are unremarkable. IMPRESSION: 1. Findings consistent with CHF. Electronically Signed   By: Elige Ko   On: 08/10/2017 12:36    Microbiology: No results found for this or any previous visit (from the past 240 hour(s)).   Labs: Basic Metabolic Panel: Recent Labs  Lab 08/08/17 0444 08/09/17 0413 08/10/17 0556 08/11/17 0450 08/13/17 0433  NA 138 140 139 140 137  K 3.4* 3.6 3.7 3.8 3.8  CL 105 107 107 107 101  CO2 25 25 24 24 26   GLUCOSE 92 93 102* 153* 124*  BUN 64* 67* 72* 71* 99*  CREATININE 2.02* 1.88* 1.92* 1.87* 2.12*  CALCIUM 8.7* 8.5* 8.7* 8.9 9.2  MG 2.5* 2.6*  --   --   --    Liver Function Tests: Recent Labs  Lab 08/07/17 0343 08/08/17 0444 08/10/17 0556  AST 24 24 23   ALT 12* 13*  10*  ALKPHOS 110 116 102  BILITOT 1.0 1.4* 1.0  PROT 6.0* 6.3* 6.3*  ALBUMIN 3.1* 3.2* 3.0*   CBC: Recent Labs  Lab 08/06/17 1710 08/07/17 0343 08/10/17 0556 08/11/17 0450  WBC 5.8 5.1 5.0 3.7*  NEUTROABS 4.1  --   --   --   HGB 12.9 12.5 11.7* 12.0  HCT 39.5 38.5 37.0 37.7  MCV 88.2 87.5 87.7 88.1  PLT 195 173 184 168   Cardiac Enzymes: Recent Labs  Lab 08/06/17 2145 08/07/17 0343  TROPONINI 0.22* 0.23*   BNP: BNP (last 3 results) Recent Labs    08/06/17 1710  BNP 3,614.5*   Signed:  Vassie Loll MD.  [facility] 08/13/2017, 3:59 PM

## 2017-08-13 NOTE — Progress Notes (Signed)
Pt selected [facility] for HHRN/PT.   

## 2017-08-13 NOTE — Progress Notes (Addendum)
[facility] - [facility] - GIP RN visit at 0840am.  This is a related and covered GIP admission of 08/06/2017 with [facility] diagnosis of Aortic Stenosis per Dr. Shareta FuFeldmann. Code status is DNR. [facility] and admitted for increased edema not responding to treatment at home.  Visited with patient and daughter at bedside this morning.  Patient was sitting up in bed and watching television.  Patient alert and oriented.  Patient declines SOB this morning while sitting in bed.  Patient denies any pain this morning.  Patient continues to have swelling in L arm, bilateral thighs.     Patient to go home possibly tomorrow per daughter.  I will need to order O2 concentrator for the home and portable O2 for transportation home, if patient doesn't revoke in favor of long term physical therapy.  I am to meet back with patient/daughter after [facility] to discuss.    Patient receiving:  Furosemide (LASIX) injection 80 mg, Dose 80 mg, TID via IV; methylPREDNISolone sodium succinate (SOLU-MEDROL) 40 mg/mL injection 40 mg, Dose 40 mg, QD via IV.  Continued medications:  0.9% sodium chloride infusion, Dose 250 mL, As needed, via IV.  Patient has received no PRN medications today thus far.  Will continue to follow while hospitalized and anticipate discharge needs.  Thank you,  Adele BarthelAmy Evans, RN, BSN [facility] Hospital Liaison [phone]  UPDATE:  Patient revoked services effective when she leaves hospital with [facility] CSW, Kirt BoysMolly today.  Cookie, CMRN, aware.

## 2017-08-13 NOTE — Progress Notes (Signed)
SATURATION QUALIFICATIONS: (This note is used to comply with regulatory documentation for home oxygen)  Patient Saturations on Room Air at Rest = 92%  Patient Saturations on Room Air while Ambulating = 91%    

## 2017-08-13 NOTE — Progress Notes (Signed)
D/c ing to home w/ DTR.All d/c instructions,belongins and scripts given w/ verbal understanding.Will make own appts. Wants to eat supper prior to d/c .

## 2017-09-14 NOTE — Progress Notes (Signed)
Cardiology Office Note Date:  09/16/2017  Patient ID:  Kara Cruz, DOB 1918-02-23, MRN [id number] PCP:  Jarome Matin, MD  Cardiologist:  Dr. Excell Seltzer Electrophysiologist: Dr. Graciela Husbands    Chief Complaint: annual device/EP visit  History of Present Illness: Kara Cruz is a 84 y.o. female with history of CHB w/ PPM, PAFib declined a/c, VHD w/ AS, HTN, hypothyroidism, comes to the office today to be seen for Dr. Graciela Husbands.   She last saw him 02/15/16, at that time c/o fatigue with the initiation of metoprolol and was discontinued.  I saw her in AUg 2017, she was accompanied by her daughter, the patient reported recently felt particularly SOB with minimal exertion (no rest SOB), she had some degree of DOE that is her baseline, though was worse then usual.  Since then had returned back to her baseline.  She had no rest SOB, denied symptoms of orthopnea/PND, however she sleept with 3 pillows, and has for perhaps a year.  She mentioned significant insomnia.  She stated she is not going to take any "dope" to help her sleep though, it did not sound like SOB is the etiology, in-fact stateed when she is in bed feels like her breathing settles down good.  She reported more that she just cannot fall asleep.  She had not observed any particular edema or unusual swelling, bloating, her weight had been stable, denied  any significant salt in her diet.  She denied any kind of CP, palpitations, no dizziness, near syncope or syncope.  We discussed her AS, she was not likely to consider intervention, though agreeable to see Dr. Excell Seltzer after an updated echo.  She did see Dr. Excell Seltzer, reviewed her echo with some reduction in LVEF 40-45%, w/severe AS, with advanced age not a traditional surgery candidate, discussed TAVR , ultimately she did not want to pursue invasive strategies and medical (palliative) treatment for her AS was planned.  She has been followed by Dr. Excell Seltzer and L. Tyrone Sage, NP for cardiology/AS  perspective.  She comes today to be seen for Dr. Graciela Husbands.  Last seen by him in Feb 2018, no changes were made at that visit.  Dec 2017 CHF hospitalization Jan 2019 CHF hospitalization  All -in-all doing pretty well post hospital stay.  She is home with family, but no longer on hospice in order to continue some PT.  She is being managed closely with her PMD for electrolytes and fluid management.  Her daughter cares for her and bring very detailed notes.  She is generally fluid negative most if not all days and her weight has been stable.  She is edematous to mid-shin and has been given directions on when to take an extra diuretic/K+.  She will occassionally feel s sharp pain seems l;eft chest to right, though none since home from the hospital.  occassionally for a few seconds gets winded but settles, no symptoms of PND or orthopnea.  No dizziness no near syncope or syncope.   Device information: MDT dual chamber PPM, implanted 09/14/14, Dr. Graciela Husbands, CHB   Past Medical History:  Diagnosis Date  . Acute on chronic systolic heart failure (HCC)   . Age-related macular degeneration   . Aortic stenosis    a. moderate  . AV heart block    a. 09/2014 Medtronic MRI compatible dual chamber pacemaker.  . Frequent headaches   . Gout   . HTN (hypertension)   . Hypothyroidism   . Osteoarthritis   . Paroxysmal atrial fibrillation (HCC)  a. identified by device interrogation    Past Surgical History:  Procedure Laterality Date  . DILATATION & CURETTAGE/HYSTEROSCOPY WITH MYOSURE    . euflexa into knees    . eye injections    . PERMANENT PACEMAKER INSERTION N/A 09/14/2014   MDT MRI compatible dual chamber pacemaker implanted by Dr Graciela Husbands  . TONSILLECTOMY     1928    Current Outpatient Medications  Medication Sig Dispense Refill  . calcium carbonate (TUMS EX) 750 MG chewable tablet Chew 2 tablets by mouth daily.    . cholecalciferol (VITAMIN D) 1000 UNITS tablet Take 1,000 Units by mouth daily.      Marland Kitchen levothyroxine (SYNTHROID, LEVOTHROID) 150 MCG tablet Take 1 tablet (150 mcg total) by mouth daily before breakfast. 30 tablet 6  . Lutein 20 MG TABS Take 20 mg by mouth daily.     . meclizine (ANTIVERT) 25 MG tablet Take 25 mg by mouth daily as needed for dizziness (or vertigo).     . Multiple Vitamins-Minerals (PRESERVISION AREDS 2) CAPS Take 2 tablets by mouth daily.    . Polyvinyl Alcohol-Povidone (REFRESH OP) Place 1 drop into both eyes at bedtime.    . Potassium 99 MG TABS Take 198 mg by mouth daily. Takes four half tablets po daily totaling 198 mg qd.    . torsemide (DEMADEX) 20 MG tablet Take 4 tablets (80 mg total) by mouth 2 (two) times daily. 180 tablet 11   No current facility-administered medications for this visit.     Allergies:   Labetalol; Latex; Moxifloxacin; Prednisone; Pseudoephedrine hcl; Codeine sulfate; Diphenhydramine hcl; Doxycycline hyclate; and Hibiclens [chlorhexidine]   Social History:  The patient  reports that she has quit smoking. she has never used smokeless tobacco. She reports that she does not drink alcohol or use drugs.   Family History:  The patient's family history includes Arthritis in her brother and mother; Edema in her brother; Heart attack in her father; Heart disease in her brother and brother; Hypertension in her mother; Kidney failure in her brother; Prostate cancer in her brother; Stroke in her brother and mother.  ROS:  Please see the history of present illness.  All other systems are reviewed and otherwise negative.   PHYSICAL EXAM:  VS:  There were no vitals taken for this visit. BMI: There is no height or weight on file to calculate BMI. Well nourished, well developed, in no acute distress  HEENT: normocephalic, atraumatic  Neck: no JVD, carotid bruits or masses Cardiac:   RRR; 2-3/6 SM, LSB, no rubs, or gallops Lungs: CTA b/l, no wheezing, rhonchi or rales  Abd: soft, nontender, MS: no deformity, age appropriate  atrophy Ext: 1++  edema b/l to mid-shin  Skin: warm and dry, no rash Neuro:  No gross deficits appreciated Psych: euthymic mood, full affect  PPM site is stable, no tethering or discomfort, no skin changes or erison   EKG:  Not done today PPM interrogation today: battery and lead measurements are stable, + AF episodes, 5 NSVT since March 2018, longest 3 seconds  2D Echo 04-03-2016: Study Conclusions - Left ventricle: The cavity size was normal. There was moderate focal basal hypertrophy of the septum. Systolic function was mildly to moderately reduced. The estimated ejection fraction was in the range of 40% to 45%. Diffuse hypokinesis. - Ventricular septum: Septal motion showed abnormal function and dyssynergy. - Aortic valve: Valve mobility was restricted. There was moderate to severe stenosis. There was moderate regurgitation. Peak velocity (S): 397 cm/s.  Mean gradient (S): 31 mm Hg. - Mitral valve: There was moderate regurgitation. - Left atrium: The atrium was severely dilated. - Tricuspid valve: There was mild-moderate regurgitation. - Pulmonic valve: There was moderate regurgitation. - Pulmonary arteries: PA peak pressure: 63 mm Hg (S). Impressions: - LVEF may be underestimated due to frequent ectopy and LBBB. Unable to view images from 11/2015, but by report systolic function has worsened since that time    11/07/15: Echocardiogram Study Conclusions - Left ventricle: The cavity size was normal. Systolic function was   normal. The estimated ejection fraction was in the range of 55%   to 60%. Wall motion was normal; there were no regional wall   motion abnormalities. Features are consistent with a pseudonormal   left ventricular filling pattern, with concomitant abnormal   relaxation and increased filling pressure (grade 2 diastolic   dysfunction). - Aortic valve: Trileaflet; moderately thickened, severely   calcified leaflets. Valve mobility was restricted. There was    moderate stenosis. There was mild regurgitation. Peak velocity   (S): 332 cm/s. Mean gradient (S): 29 mm Hg. - Mitral valve: There was moderate regurgitation. - Left atrium: The atrium was mildly dilated. - Right ventricle: Pacer wire or catheter noted in right ventricle. - Tricuspid valve: There was moderate regurgitation. - Pulmonary arteries: Systolic pressure was severely increased. PA   peak pressure: 63 mm Hg (S). - Pericardium, extracardiac: A small pericardial effusion was   identified along the left ventricular free wall. There was no   evidence of hemodynamic compromise. Impressions: - Stable aortic stenosis (prior mean ).  2D ECHO: 09/16/2014 LV EF: 65% -  70% Study Conclusions - Left ventricle: The cavity size was normal. Systolic function was vigorous. The estimated ejection fraction was in the range of 65% to 70%. Wall motion was normal; there were no regional wall motion abnormalities. The study is not technically sufficient to allow evaluation of LV diastolic function. - Aortic valve: Valve mobility was restricted. There was mild regurgitation. Peak gradient (S): 55 mm Hg. Valve area (VTI): 0.93 cm^2. Valve area (Vmax): 0.87 cm^2. Valve area (Vmean): 0.85 cm^2. - Aortic root: The aortic root was normal in size. - Mitral valve: There was moderate regurgitation. - Tricuspid valve: There was moderate-severe regurgitation. - Pulmonic valve: There was mild regurgitation. - Pulmonary arteries: Systolic pressure was moderately to severely increased. PA peak pressure: 53 mm Hg (S). Impressions: - Compared to the prior study performed on 09/13/2014 the degree of aortic stenosis is now moderate with transaortic gradients: mean 31 mmHg, peak 55 Hg, previosuly mean 44 mmHg and peak 63 mmHg.  Recent Labs: 08/06/2017: B Natriuretic Peptide 3,614.5; TSH 1.594 08/09/2017: Magnesium 2.6 08/10/2017: ALT 10 08/11/2017: Hemoglobin 12.0; Platelets 168 08/13/2017:  BUN 99; Creatinine, Ser 2.12; Potassium 3.8; Sodium 137  No results found for requested labs within last 8760 hours.   CrCl cannot be calculated (Patient's most recent lab result is older than the maximum 21 days allowed.).   Wt Readings from Last 3 Encounters:  08/13/17 145 lb 3.2 oz (65.9 kg)  03/10/17 125 lb (56.7 kg)  12/25/16 123 lb 9.6 oz (56.1 kg)     Other studies reviewed: Additional studies/records reviewed today include: summarized above  ASSESSMENT AND PLAN:  1. Chronic CHF (diastolic/AS)     + edema, home numbers look good     Encouraged elevation of feet when seated, follow diuretic instructions with PMD who is managing      2. PAFib  CHA2DS2Vasc is at least 4, had declined a/c     Intolerant of BB/lopresor with fatigue, reports allergy to labetalol  3. Hx of 2:1 heart block/bifasicular block/PPM     intact device function, no changes made  4. HTN     No BP meds    Avoid hypotension with severe AS  5. Severe, stage D Aortic Stenosis     Medical/palliative management     Had been with Dr. Excell Seltzerooper and L. Gerhardt, NP, though had gone on Hospice     No longer on hospice though are managing closely her fluid status, labs, diuretic with her PMD      Disposition: continue Q 3 month remotes, 1 year with EP in-clinic, sooner if needed.    Current medicines are reviewed at length with the patient today.  The patient did not have any concerns regarding medicines.  Judith BlonderSigned, Renee Ursy, PA-C 09/16/2017 11:12 AM     [facility] [address] [place] [address] [phone] (office)  [phone] (fax)

## 2017-09-16 ENCOUNTER — Ambulatory Visit (INDEPENDENT_AMBULATORY_CARE_PROVIDER_SITE_OTHER): Payer: Medicare Other | Admitting: Physician Assistant

## 2017-09-16 ENCOUNTER — Encounter: Payer: Self-pay | Admitting: Physician Assistant

## 2017-09-16 VITALS — BP 130/66 | HR 68 | Ht 62.0 in | Wt 122.2 lb

## 2017-09-16 DIAGNOSIS — Z950 Presence of cardiac pacemaker: Secondary | ICD-10-CM

## 2017-09-16 DIAGNOSIS — I5043 Acute on chronic combined systolic (congestive) and diastolic (congestive) heart failure: Secondary | ICD-10-CM

## 2017-09-16 DIAGNOSIS — I350 Nonrheumatic aortic (valve) stenosis: Secondary | ICD-10-CM | POA: Diagnosis not present

## 2017-09-16 DIAGNOSIS — I5032 Chronic diastolic (congestive) heart failure: Secondary | ICD-10-CM

## 2017-09-16 DIAGNOSIS — I481 Persistent atrial fibrillation: Secondary | ICD-10-CM

## 2017-09-16 DIAGNOSIS — I4819 Other persistent atrial fibrillation: Secondary | ICD-10-CM

## 2017-09-16 NOTE — Patient Instructions (Addendum)
Medication Instructions:   Your physician recommends that you continue on your current medications as directed. Please refer to the Current Medication list given to you today.   If you need a refill on your cardiac medications before your next appointment, please call your pharmacy.  Labwork:   NONE ORDERED  TODAY    Testing/Procedures: NONE ORDERED  TODAY     Follow-Up:  Your physician wants you to follow-up in: ONE YEAR WITH KLEIN /URSUY  You will receive a reminder letter in the mail two months in advance. If you don't receive a letter, please call our office to schedule the follow-up appointment.    Remote monitoring is used to monitor your Pacemaker of ICD from home. This monitoring reduces the number of office visits required to check your device to one time per year. It allows us to keep an eye on the functioning of your device to ensure it is working properly. You are scheduled for a device check from home on ...2-19-19You may send your transmission at any time that day. If you have a wireless device, the transmission will be sent automatically. After your physician reviews your transmission, you will receive a postcard with your next transmission date.     Any Other Special Instructions Will Be Listed Below (If Applicable).                                                                                                                                                   

## 2017-09-23 ENCOUNTER — Ambulatory Visit (INDEPENDENT_AMBULATORY_CARE_PROVIDER_SITE_OTHER): Payer: Medicare Other | Admitting: *Deleted

## 2017-09-23 DIAGNOSIS — I442 Atrioventricular block, complete: Secondary | ICD-10-CM | POA: Diagnosis not present

## 2017-09-23 NOTE — Progress Notes (Signed)
Remote pacemaker transmission.   

## 2017-09-24 LAB — CUP PACEART REMOTE DEVICE CHECK
Battery Voltage: 3 V
Brady Statistic AP VP Percent: 50.47 %
Brady Statistic AP VS Percent: 0.02 %
Brady Statistic AS VP Percent: 47.88 %
Brady Statistic AS VS Percent: 1.63 %
Brady Statistic RA Percent Paced: 48.13 %
Date Time Interrogation Session: 20190219141942
Implantable Lead Implant Date: 20160210
Implantable Lead Implant Date: 20160210
Implantable Lead Location: 753859
Implantable Lead Location: 753860
Implantable Lead Model: 5076
Implantable Pulse Generator Implant Date: 20160210
Lead Channel Impedance Value: 323 Ohm
Lead Channel Impedance Value: 342 Ohm
Lead Channel Impedance Value: 456 Ohm
Lead Channel Pacing Threshold Amplitude: 0.625 V
Lead Channel Pacing Threshold Amplitude: 0.875 V
Lead Channel Pacing Threshold Pulse Width: 0.4 ms
Lead Channel Pacing Threshold Pulse Width: 0.4 ms
Lead Channel Sensing Intrinsic Amplitude: 1.125 mV
Lead Channel Sensing Intrinsic Amplitude: 1.125 mV
Lead Channel Sensing Intrinsic Amplitude: 10.625 mV
Lead Channel Setting Pacing Amplitude: 2 V
MDC IDC MSMT BATTERY REMAINING LONGEVITY: 70 mo
MDC IDC MSMT LEADCHNL RV IMPEDANCE VALUE: 437 Ohm
MDC IDC MSMT LEADCHNL RV SENSING INTR AMPL: 10.625 mV
MDC IDC SET LEADCHNL RV PACING AMPLITUDE: 2.5 V
MDC IDC SET LEADCHNL RV PACING PULSEWIDTH: 0.4 ms
MDC IDC SET LEADCHNL RV SENSING SENSITIVITY: 2 mV
MDC IDC STAT BRADY RV PERCENT PACED: 96.08 %

## 2017-09-25 ENCOUNTER — Encounter: Payer: Self-pay | Admitting: Cardiology

## 2017-11-06 ENCOUNTER — Other Ambulatory Visit: Payer: Self-pay

## 2017-11-06 ENCOUNTER — Inpatient Hospital Stay (HOSPITAL_COMMUNITY)
Admission: EM | Admit: 2017-11-06 | Discharge: 2017-11-14 | DRG: 291 | Disposition: A | Discharge status: Hospice | Payer: Medicare Other | Attending: Internal Medicine | Admitting: Internal Medicine

## 2017-11-06 ENCOUNTER — Emergency Department (HOSPITAL_COMMUNITY): Payer: Medicare Other

## 2017-11-06 ENCOUNTER — Encounter (HOSPITAL_COMMUNITY): Payer: Self-pay | Admitting: *Deleted

## 2017-11-06 DIAGNOSIS — R0603 Acute respiratory distress: Secondary | ICD-10-CM | POA: Diagnosis present

## 2017-11-06 DIAGNOSIS — E039 Hypothyroidism, unspecified: Secondary | ICD-10-CM | POA: Diagnosis present

## 2017-11-06 DIAGNOSIS — I350 Nonrheumatic aortic (valve) stenosis: Secondary | ICD-10-CM

## 2017-11-06 DIAGNOSIS — N179 Acute kidney failure, unspecified: Secondary | ICD-10-CM | POA: Diagnosis present

## 2017-11-06 DIAGNOSIS — I4510 Unspecified right bundle-branch block: Secondary | ICD-10-CM | POA: Diagnosis present

## 2017-11-06 DIAGNOSIS — Z888 Allergy status to other drugs, medicaments and biological substances status: Secondary | ICD-10-CM | POA: Diagnosis not present

## 2017-11-06 DIAGNOSIS — Z841 Family history of disorders of kidney and ureter: Secondary | ICD-10-CM | POA: Diagnosis not present

## 2017-11-06 DIAGNOSIS — Z885 Allergy status to narcotic agent status: Secondary | ICD-10-CM | POA: Diagnosis not present

## 2017-11-06 DIAGNOSIS — I509 Heart failure, unspecified: Secondary | ICD-10-CM

## 2017-11-06 DIAGNOSIS — Z950 Presence of cardiac pacemaker: Secondary | ICD-10-CM | POA: Diagnosis not present

## 2017-11-06 DIAGNOSIS — I083 Combined rheumatic disorders of mitral, aortic and tricuspid valves: Secondary | ICD-10-CM | POA: Diagnosis present

## 2017-11-06 DIAGNOSIS — M1990 Unspecified osteoarthritis, unspecified site: Secondary | ICD-10-CM | POA: Diagnosis present

## 2017-11-06 DIAGNOSIS — L8990 Pressure ulcer of unspecified site, unspecified stage: Secondary | ICD-10-CM | POA: Diagnosis present

## 2017-11-06 DIAGNOSIS — Z8261 Family history of arthritis: Secondary | ICD-10-CM

## 2017-11-06 DIAGNOSIS — Z87891 Personal history of nicotine dependence: Secondary | ICD-10-CM

## 2017-11-06 DIAGNOSIS — I480 Paroxysmal atrial fibrillation: Secondary | ICD-10-CM | POA: Diagnosis present

## 2017-11-06 DIAGNOSIS — I340 Nonrheumatic mitral (valve) insufficiency: Secondary | ICD-10-CM | POA: Diagnosis not present

## 2017-11-06 DIAGNOSIS — N185 Chronic kidney disease, stage 5: Secondary | ICD-10-CM | POA: Diagnosis present

## 2017-11-06 DIAGNOSIS — I132 Hypertensive heart and chronic kidney disease with heart failure and with stage 5 chronic kidney disease, or end stage renal disease: Principal | ICD-10-CM | POA: Diagnosis present

## 2017-11-06 DIAGNOSIS — E876 Hypokalemia: Secondary | ICD-10-CM | POA: Diagnosis present

## 2017-11-06 DIAGNOSIS — I5043 Acute on chronic combined systolic (congestive) and diastolic (congestive) heart failure: Secondary | ICD-10-CM | POA: Diagnosis present

## 2017-11-06 DIAGNOSIS — Z8249 Family history of ischemic heart disease and other diseases of the circulatory system: Secondary | ICD-10-CM

## 2017-11-06 DIAGNOSIS — Z79890 Hormone replacement therapy (postmenopausal): Secondary | ICD-10-CM

## 2017-11-06 DIAGNOSIS — Z823 Family history of stroke: Secondary | ICD-10-CM | POA: Diagnosis not present

## 2017-11-06 DIAGNOSIS — R609 Edema, unspecified: Secondary | ICD-10-CM | POA: Diagnosis present

## 2017-11-06 DIAGNOSIS — Z8042 Family history of malignant neoplasm of prostate: Secondary | ICD-10-CM

## 2017-11-06 DIAGNOSIS — N183 Chronic kidney disease, stage 3 (moderate): Secondary | ICD-10-CM | POA: Diagnosis not present

## 2017-11-06 DIAGNOSIS — I361 Nonrheumatic tricuspid (valve) insufficiency: Secondary | ICD-10-CM | POA: Diagnosis not present

## 2017-11-06 DIAGNOSIS — I442 Atrioventricular block, complete: Secondary | ICD-10-CM | POA: Diagnosis present

## 2017-11-06 DIAGNOSIS — M109 Gout, unspecified: Secondary | ICD-10-CM | POA: Diagnosis present

## 2017-11-06 DIAGNOSIS — H3530 Unspecified macular degeneration: Secondary | ICD-10-CM | POA: Diagnosis present

## 2017-11-06 DIAGNOSIS — Z91040 Latex allergy status: Secondary | ICD-10-CM | POA: Diagnosis not present

## 2017-11-06 DIAGNOSIS — R601 Generalized edema: Secondary | ICD-10-CM

## 2017-11-06 DIAGNOSIS — Z66 Do not resuscitate: Secondary | ICD-10-CM | POA: Diagnosis present

## 2017-11-06 LAB — CBC WITH DIFFERENTIAL/PLATELET
BASOS ABS: 0 10*3/uL (ref 0.0–0.1)
BASOS PCT: 1 %
EOS ABS: 0 10*3/uL (ref 0.0–0.7)
EOS PCT: 1 %
HCT: 37.6 % (ref 36.0–46.0)
Hemoglobin: 12.2 g/dL (ref 12.0–15.0)
LYMPHS PCT: 22 %
Lymphs Abs: 1.1 10*3/uL (ref 0.7–4.0)
MCH: 28.3 pg (ref 26.0–34.0)
MCHC: 32.4 g/dL (ref 30.0–36.0)
MCV: 87.2 fL (ref 78.0–100.0)
MONO ABS: 0.5 10*3/uL (ref 0.1–1.0)
Monocytes Relative: 10 %
Neutro Abs: 3.5 10*3/uL (ref 1.7–7.7)
Neutrophils Relative %: 66 %
PLATELETS: 214 10*3/uL (ref 150–400)
RBC: 4.31 MIL/uL (ref 3.87–5.11)
RDW: 18.1 % — AB (ref 11.5–15.5)
WBC: 5.2 10*3/uL (ref 4.0–10.5)

## 2017-11-06 LAB — I-STAT CG4 LACTIC ACID, ED: LACTIC ACID, VENOUS: 2.1 mmol/L — AB (ref 0.5–1.9)

## 2017-11-06 LAB — BRAIN NATRIURETIC PEPTIDE: B NATRIURETIC PEPTIDE 5: 3112.6 pg/mL — AB (ref 0.0–100.0)

## 2017-11-06 LAB — COMPREHENSIVE METABOLIC PANEL
ALK PHOS: 126 U/L (ref 38–126)
ALT: 11 U/L — ABNORMAL LOW (ref 14–54)
AST: 29 U/L (ref 15–41)
Albumin: 3.4 g/dL — ABNORMAL LOW (ref 3.5–5.0)
Anion gap: 14 (ref 5–15)
BUN: 86 mg/dL — ABNORMAL HIGH (ref 6–20)
CALCIUM: 8.9 mg/dL (ref 8.9–10.3)
CO2: 22 mmol/L (ref 22–32)
CREATININE: 2.36 mg/dL — AB (ref 0.44–1.00)
Chloride: 99 mmol/L — ABNORMAL LOW (ref 101–111)
GFR, EST AFRICAN AMERICAN: 19 mL/min — AB (ref >60)
GFR, EST NON AFRICAN AMERICAN: 16 mL/min — AB (ref >60)
Glucose, Bld: 97 mg/dL (ref 65–99)
Potassium: 4.5 mmol/L (ref 3.5–5.1)
SODIUM: 135 mmol/L (ref 135–145)
Total Bilirubin: 0.9 mg/dL (ref 0.3–1.2)
Total Protein: 7.2 g/dL (ref 6.5–8.1)

## 2017-11-06 LAB — BLOOD GAS, VENOUS
Acid-base deficit: 1.5 mmol/L (ref 0.0–2.0)
BICARBONATE: 22.1 mmol/L (ref 20.0–28.0)
O2 Saturation: 62.4 %
PATIENT TEMPERATURE: 98.6
PH VEN: 7.415 (ref 7.250–7.430)
pCO2, Ven: 35.1 mmHg — ABNORMAL LOW (ref 44.0–60.0)
pO2, Ven: 36.2 mmHg (ref 32.0–45.0)

## 2017-11-06 LAB — TROPONIN I: TROPONIN I: 0.09 ng/mL — AB (ref <0.03)

## 2017-11-06 MED ORDER — ONDANSETRON HCL 4 MG/2ML IJ SOLN: 4.0000 mg | Freq: Four times a day (QID) | INTRAMUSCULAR | Status: DC | PRN

## 2017-11-06 MED ORDER — ALBUTEROL SULFATE (2.5 MG/3ML) 0.083% IN NEBU
5.0000 mg | INHALATION_SOLUTION | Freq: Once | RESPIRATORY_TRACT | Status: AC
  Administered 2017-11-06: 5 mg via RESPIRATORY_TRACT
  Filled 2017-11-06: qty 6

## 2017-11-06 MED ORDER — LEVOTHYROXINE SODIUM 150 MCG PO TABS
150.0000 ug | ORAL_TABLET | Freq: Every day | ORAL | Status: DC
  Administered 2017-11-07 – 2017-11-14 (×8): 150 ug via ORAL
  Filled 2017-11-06: qty 2
  Filled 2017-11-06 (×2): qty 1
  Filled 2017-11-06: qty 2
  Filled 2017-11-06 (×3): qty 1
  Filled 2017-11-06: qty 2
  Filled 2017-11-06 (×3): qty 1
  Filled 2017-11-06 (×2): qty 2
  Filled 2017-11-06: qty 3
  Filled 2017-11-06: qty 2
  Filled 2017-11-06: qty 1
  Filled 2017-11-06: qty 2

## 2017-11-06 MED ORDER — VITAMIN D3 25 MCG (1000 UNIT) PO TABS
1000.0000 [IU] | ORAL_TABLET | Freq: Every day | ORAL | Status: DC
  Administered 2017-11-07 – 2017-11-14 (×8): 1000 [IU] via ORAL
  Filled 2017-11-06 (×8): qty 1

## 2017-11-06 MED ORDER — SODIUM CHLORIDE 0.9 % IV SOLN: 250.0000 mL | INTRAVENOUS | Status: DC | PRN

## 2017-11-06 MED ORDER — SODIUM CHLORIDE 0.9% FLUSH: 3.0000 mL | INTRAVENOUS | Status: DC | PRN

## 2017-11-06 MED ORDER — HEPARIN SODIUM (PORCINE) 5000 UNIT/ML IJ SOLN
5000.0000 [IU] | Freq: Three times a day (TID) | INTRAMUSCULAR | Status: DC
  Administered 2017-11-06 – 2017-11-14 (×19): 5000 [IU] via SUBCUTANEOUS
  Filled 2017-11-06 (×20): qty 1

## 2017-11-06 MED ORDER — FUROSEMIDE 10 MG/ML IJ SOLN
100.0000 mg | INTRAVENOUS | Status: AC
  Administered 2017-11-06: 100 mg via INTRAVENOUS
  Filled 2017-11-06: qty 10

## 2017-11-06 MED ORDER — LUTEIN 20 MG PO TABS: 20.0000 mg | ORAL_TABLET | Freq: Every day | ORAL | Status: DC

## 2017-11-06 MED ORDER — FUROSEMIDE 10 MG/ML IJ SOLN
100.0000 mg | Freq: Two times a day (BID) | INTRAVENOUS | Status: DC
  Filled 2017-11-06: qty 10

## 2017-11-06 MED ORDER — CALCIUM CARBONATE ANTACID 500 MG PO CHEW
1000.0000 mg | CHEWABLE_TABLET | Freq: Every day | ORAL | Status: DC
  Administered 2017-11-06 – 2017-11-14 (×9): 1000 mg via ORAL
  Filled 2017-11-06 (×9): qty 5

## 2017-11-06 MED ORDER — ACETAMINOPHEN 325 MG PO TABS
650.0000 mg | ORAL_TABLET | ORAL | Status: DC | PRN
  Administered 2017-11-13: 650 mg via ORAL
  Filled 2017-11-06: qty 2

## 2017-11-06 MED ORDER — PROSIGHT PO TABS
2.0000 | ORAL_TABLET | Freq: Every day | ORAL | Status: DC
  Administered 2017-11-07 – 2017-11-14 (×8): 2 via ORAL
  Filled 2017-11-06 (×8): qty 2

## 2017-11-06 MED ORDER — SODIUM CHLORIDE 0.9% FLUSH
3.0000 mL | Freq: Two times a day (BID) | INTRAVENOUS | Status: DC
  Administered 2017-11-06 – 2017-11-14 (×15): 3 mL via INTRAVENOUS

## 2017-11-06 MED ORDER — POLYVINYL ALCOHOL 1.4 % OP SOLN
1.0000 [drp] | Freq: Every day | OPHTHALMIC | Status: DC
  Administered 2017-11-06 – 2017-11-13 (×8): 1 [drp] via OPHTHALMIC
  Filled 2017-11-06: qty 15

## 2017-11-06 MED ORDER — MECLIZINE HCL 25 MG PO TABS: 25.0000 mg | ORAL_TABLET | Freq: Every day | ORAL | Status: DC | PRN

## 2017-11-06 NOTE — ED Triage Notes (Addendum)
Pt's daughter reports she was sent to the ED by Dr. Eloise HarmanPaterson (Cards) to be directly admitted to the hospital by Dr. Selena BattenKim.  Bed placement contacted, no bed assigned for pt.  Her daughter reports worsening sob and have been "deteriorating since this she was dx with CHF 2 years ago.  Pt is A&OX 4.  Breathing is labored with use of accessory muscles.  Pt unable to complete a sentence without pausing.  She was told that she only has 6 months to live.  Pt's L arm is grossly swollen as well as her BLE.  Pitting edema 2+ noted.

## 2017-11-06 NOTE — Progress Notes (Signed)
PHARMACIST - PHYSICIAN ORDER COMMUNICATION  CONCERNING: P&T Medication Policy on Herbal Medications  DESCRIPTION:  This patient's order for:  Leutin  has been noted.  This product(s) is classified as an "herbal" or natural product. Due to a lack of definitive safety studies or FDA approval, nonstandard manufacturing practices, plus the potential risk of unknown drug-drug interactions while on inpatient medications, the Pharmacy and Therapeutics Committee does not permit the use of "herbal" or natural products of this type within [facility].   ACTION TAKEN: The pharmacy department is unable to verify this order at this time and your patient has been informed of this safety policy. Please reevaluate patient's clinical condition at discharge and address if the herbal or natural product(s) should be resumed at that time.  Thanks Lorenza EvangelistGreen, Trichelle Lehan R 11/06/2017 11:19 PM

## 2017-11-06 NOTE — ED Provider Notes (Addendum)
[facility]-EMERGENCY DEPT Provider Note   CSN: [id number] Arrival date & time: 11/06/17  1853     History   Chief Complaint Chief Complaint  Patient presents with  . Respiratory Distress    HPI Kara Cruz is a 84 y.o. female.  84 year old female with past medical history including CHF, aortic stenosis, pacemaker, paroxysmal atrial fibrillation who presents with shortness of breath and edema.  The patient was evaluated by her PCP today for progressively worsening shortness of breath and difficulty breathing over the past few weeks.  She has had worsening lower extremity and left arm edema.  Daughter notes that she had left arm edema in the past and had workup during her hospitalization which was negative for DVT and they suspected her swelling may be due to the pacemaker on that side.  She has had orthopnea, no cough or fevers.  She has had decreased urine output recently which daughter notes has directly correlated with the addition of a medication Inspra. She has gained at least 30lb over the past few weeks.   The history is provided by the patient and a relative.    Past Medical History:  Diagnosis Date  . Acute on chronic systolic heart failure (HCC)   . Age-related macular degeneration   . Aortic stenosis    a. moderate  . AV heart block    a. 09/2014 Medtronic MRI compatible dual chamber pacemaker.  . Frequent headaches   . Gout   . HTN (hypertension)   . Hypothyroidism   . Osteoarthritis   . Paroxysmal atrial fibrillation (HCC)    a. identified by device interrogation    Patient Active Problem List   Diagnosis Date Noted  . Acute on chronic combined systolic and diastolic CHF (congestive heart failure) (HCC) 11/06/2017  . Hypoxia   . SOB (shortness of breath)   . Acute renal failure superimposed on stage 3 chronic kidney disease (HCC)   . Edema 08/06/2017  . ARF (acute renal failure) (HCC) 08/06/2017  . AKI (acute kidney injury) (HCC)   .  Peripheral edema   . Acute on chronic combined systolic and diastolic HF (heart failure) (HCC) 07/23/2016  . AMD (age-related macular degeneration), wet (HCC) 10/06/2014  . Retinal macular atrophy 10/06/2014  . History of surgical procedure 10/06/2014  . Complete heart block (HCC) 09/14/2014  . Nonrheumatic aortic valve stenosis 09/14/2014  . Degeneration macular 07/16/2011  . GOUT 06/28/2008  . ACTINIC KERATOSIS 06/26/2007  . Hypothyroidism 03/19/2007  . Essential hypertension 03/19/2007  . RIGHT BUNDLE BRANCH BLOCK 03/19/2007  . OSTEOPOROSIS 03/19/2007    Past Surgical History:  Procedure Laterality Date  . DILATATION & CURETTAGE/HYSTEROSCOPY WITH MYOSURE    . euflexa into knees    . eye injections    . PERMANENT PACEMAKER INSERTION N/A 09/14/2014   MDT MRI compatible dual chamber pacemaker implanted by Dr Graciela Husbands  . TONSILLECTOMY     1928     OB History   None      Home Medications    Prior to Admission medications   Medication Sig Start Date End Date Taking? Authorizing Provider  calcium carbonate (TUMS EX) 750 MG chewable tablet Chew 2 tablets by mouth daily.   Yes [provider]  cholecalciferol (VITAMIN D) 1000 UNITS tablet Take 1,000 Units by mouth daily.   Yes [provider]  eplerenone (INSPRA) 25 MG tablet Take 12.5 mg by mouth daily. 10/21/17  Yes [provider]  levothyroxine (SYNTHROID, LEVOTHROID) 150  MCG tablet Take 1 tablet (150 mcg total) by mouth daily before breakfast. 07/30/16  Yes Laverda Page B, NP  Lutein 20 MG TABS Take 20 mg by mouth daily.    Yes [provider]  meclizine (ANTIVERT) 25 MG tablet Take 25 mg by mouth daily as needed for dizziness (or vertigo).    Yes [provider]  Multiple Vitamins-Minerals (PRESERVISION AREDS 2) CAPS Take 2 tablets by mouth daily.   Yes [provider]  Polyvinyl Alcohol-Povidone (REFRESH OP) Place 1 drop into both eyes at bedtime.   Yes [provider]  Potassium 99 MG TABS Take 198 mg by mouth daily. 2 tablets in the AM and 1 tablet in the evening   Yes [provider]  torsemide (DEMADEX) 20 MG tablet Take 4 tablets (80 mg total) by mouth 2 (two) times daily. 08/13/17  Yes Vassie Loll, MD    Family History Family History  Problem Relation Age of Onset  . Hypertension Mother   . Arthritis Mother   . Stroke Mother   . Heart attack Father   . Stroke Brother   . Prostate cancer Brother   . Heart disease Brother   . Kidney failure Brother   . Heart disease Brother   . Arthritis Brother   . Edema Brother     Social History Social History   Tobacco Use  . Smoking status: Former Games developer  . Smokeless tobacco: Never Used  Substance Use Topics  . Alcohol use: No  . Drug use: No     Allergies   Labetalol; Latex; Moxifloxacin; Prednisone; Pseudoephedrine hcl; Codeine sulfate; Diphenhydramine hcl; Doxycycline hyclate; and Hibiclens [chlorhexidine]   Review of Systems Review of Systems All other systems reviewed and are negative except that which was mentioned in HPI   Physical Exam Updated Vital Signs BP 111/68   Pulse 68   Temp (!) 97.5 F (36.4 C) (Axillary)   Resp (!) 24   SpO2 95%   Physical Exam  Constitutional: She is oriented to person, place, and time. She appears well-developed and well-nourished. No distress.  HENT:  Head: Normocephalic and atraumatic.  Moist mucous membranes  Eyes: Pupils are equal, round, and reactive to light. Conjunctivae are normal.  Neck: Neck supple. JVD present.  Cardiovascular: Normal rate and regular rhythm.  Murmur heard. Pulmonary/Chest:  Increased work of breathing with mild accessory use, no respiratory distress, faint crackles b/l with diminished BS in bases  Abdominal: Soft. Bowel sounds are normal. She exhibits distension. There is no tenderness.  Musculoskeletal: She exhibits edema.  3+ pitting edema BLE up to thighs as well as L arm from hand  to upper arm  Neurological: She is alert and oriented to person, place, and time.  Fluent speech  Skin: Skin is warm and dry.  Psychiatric: She has a normal mood and affect. Judgment normal.  Nursing note and vitals reviewed.    ED Treatments / Results  Labs (all labs ordered are listed, but only abnormal results are displayed) Labs Reviewed  COMPREHENSIVE METABOLIC PANEL - Abnormal; Notable for the following components:      Result Value   Chloride 99 (*)    BUN 86 (*)    Creatinine, Ser 2.36 (*)    Albumin 3.4 (*)    ALT 11 (*)    GFR calc non Af Amer 16 (*)    GFR calc Af Amer 19 (*)    All other components within normal limits  TROPONIN I -  Abnormal; Notable for the following components:   Troponin I 0.09 (*)    All other components within normal limits  BRAIN NATRIURETIC PEPTIDE - Abnormal; Notable for the following components:   B Natriuretic Peptide 3,112.6 (*)    All other components within normal limits  CBC WITH DIFFERENTIAL/PLATELET - Abnormal; Notable for the following components:   RDW 18.1 (*)    All other components within normal limits  BLOOD GAS, VENOUS - Abnormal; Notable for the following components:   pCO2, Ven 35.1 (*)    All other components within normal limits  I-STAT CG4 LACTIC ACID, ED - Abnormal; Notable for the following components:   Lactic Acid, Venous 2.10 (*)    All other components within normal limits  BASIC METABOLIC PANEL  I-STAT CG4 LACTIC ACID, ED    EKG None  Radiology Dg Chest 2 View  Result Date: 11/06/2017 CLINICAL DATA:  Shortness of breath EXAM: CHEST - 2 VIEW COMPARISON:  08/10/2017, 08/06/2017, 09/16/2014 FINDINGS: Small pleural effusions. Cardiomegaly with vascular congestion. Aortic atherosclerosis. Left-sided pacing device with similar lead position. No pneumothorax. IMPRESSION: Cardiomegaly with vascular congestion and small pleural effusions. Electronically Signed   By: Jasmine PangKim  Fujinaga M.D.   On: 11/06/2017 20:21     Procedures .Critical Care Performed by: Laurence SpatesLittle, Alvenia Treese Morgan, MD Authorized by: Laurence SpatesLittle, Abishai Viegas Morgan, MD   Critical care provider statement:    Critical care time (minutes):  30   Critical care time was exclusive of:  Separately billable procedures and treating other patients   Critical care was necessary to treat or prevent imminent or life-threatening deterioration of the following conditions:  Cardiac failure and respiratory failure   Critical care was time spent personally by me on the following activities:  Development of treatment plan with patient or surrogate, evaluation of patient's response to treatment, examination of patient, obtaining history from patient or surrogate, ordering and performing treatments and interventions, ordering and review of laboratory studies, ordering and review of radiographic studies, re-evaluation of patient's condition and review of old charts   (including critical care time)  Medications Ordered in ED Medications  sodium chloride flush (NS) 0.9 % injection 3 mL (has no administration in time range)  sodium chloride flush (NS) 0.9 % injection 3 mL (has no administration in time range)  0.9 %  sodium chloride infusion (has no administration in time range)  acetaminophen (TYLENOL) tablet 650 mg (has no administration in time range)  ondansetron (ZOFRAN) injection 4 mg (has no administration in time range)  heparin injection 5,000 Units (has no administration in time range)  furosemide (LASIX) 100 mg in dextrose 5 % 50 mL IVPB (has no administration in time range)  calcium carbonate (TUMS EX) chewable tablet 1,500 mg (has no administration in time range)  cholecalciferol (VITAMIN D) tablet 1,000 Units (has no administration in time range)  levothyroxine (SYNTHROID, LEVOTHROID) tablet 150 mcg (has no administration in time range)  Lutein TABS 20 mg (has no administration in time range)  meclizine (ANTIVERT) tablet 25 mg (has no administration in  time range)  PRESERVISION AREDS 2 CAPS 2 tablet (has no administration in time range)  carboxymethylcellulose 1 % ophthalmic solution 1 drop (has no administration in time range)  albuterol (PROVENTIL) (2.5 MG/3ML) 0.083% nebulizer solution 5 mg (5 mg Nebulization Given 11/06/17 2124)  furosemide (LASIX) 100 mg in dextrose 5 % 50 mL IVPB (0 mg Intravenous Stopped 11/06/17 2227)     Initial Impression / Assessment and Plan / ED Course  I have reviewed the triage vital signs and the nursing notes.  Pertinent labs & imaging results that were available during my care of the patient were reviewed by me and considered in my medical decision making (see chart for details).    Pt dyspneic but non-toxic on exam, afebrile, not requiring supplemental O2.  She was severely edematous with JVD.  Blood gas reassuring, troponin 0 0.09, BNP 3112, BUN 86 and creatinine 2.36 which is elevated from baseline. I suspect mildly positive troponin is due to demand ischemia. Chest x-ray shows evidence of volume overload.  Given she is up 30 pounds and has been compliant with her home medications, recommended admission for IV diuresis. Gave 100mg  IV lasix. Discussed with [facility] hospitalist, Dr. Julian Reil, and pt admitted for further care.  Final Clinical Impressions(s) / ED Diagnoses   Final diagnoses:  None    ED Discharge Orders    None       Jefferie Holston, Ambrose Finland, MD 11/06/17 2304    Clarene Duke Ambrose Finland, MD 11/20/17 [phone]

## 2017-11-06 NOTE — ED Notes (Signed)
ED TO INPATIENT HANDOFF REPORT  Name/Age/Gender Kara Cruz 84 y.o. female  Code Status    Code Status Orders  (From admission, onward)        Start     Ordered   11/06/17 2143  Do not attempt resuscitation (DNR)  Continuous    Question Answer Comment  In the event of cardiac or respiratory ARREST Do not call a "code blue"   In the event of cardiac or respiratory ARREST Do not perform Intubation, CPR, defibrillation or ACLS   In the event of cardiac or respiratory ARREST Use medication by any route, position, wound care, and other measures to relive pain and suffering. May use oxygen, suction and manual treatment of airway obstruction as needed for comfort.      11/06/17 2145    Code Status History    Date Active Date Inactive Code Status Order ID Comments User Context   08/06/2017 2138 08/13/2017 2102 DNR [id number]  Jani Gravel, MD Inpatient   07/23/2016 1804 07/30/2016 1717 DNR [id number]  Arbutus Leas, NP Inpatient   07/23/2016 1710 07/23/2016 1804 Full Code [id number]  Consuelo Pandy, PA-C Inpatient   09/14/2014 1805 09/16/2014 1759 Full Code [id number]  Deboraha Sprang, MD Inpatient      Home/SNF/Other Home  Chief Complaint Edema; CHF  Level of Care/Admitting Diagnosis ED Disposition    ED Disposition Condition [facility] Area: [facility] [[id number]]  Level of Care: Telemetry [5]  Admit to tele based on following criteria: Acute CHF  Diagnosis: Acute on chronic combined systolic and diastolic CHF (congestive heart failure) [facility]) [[id number]]  Admitting Physician: Doreatha Massed  Attending Physician: Etta Quill [phone]  Estimated length of stay: past midnight tomorrow  Certification:: I certify this patient will need inpatient services for at least 2 midnights  PT Class (Do Not Modify): Inpatient [101]  PT Acc Code (Do Not Modify): Private [1]       Medical History Past Medical History:  Diagnosis Date  . Acute  on chronic systolic heart failure ([place])   . Age-related macular degeneration   . Aortic stenosis    a. moderate  . AV heart block    a. 09/2014 Medtronic MRI compatible dual chamber pacemaker.  . Frequent headaches   . Gout   . HTN (hypertension)   . Hypothyroidism   . Osteoarthritis   . Paroxysmal atrial fibrillation (HCC)    a. identified by device interrogation    Allergies Allergies  Allergen Reactions  . Labetalol Swelling    Blisters around lips Swollen lips & eyes, blistering lips, dry mouth  . Latex Itching  . Moxifloxacin Other (See Comments)    Doesn't remember  . Prednisone Nausea And Vomiting    Previously listed as SHOB, but verified with patient/daughter 08/10/17, only N/V as per [place] record  . Pseudoephedrine Hcl Other (See Comments)  . Codeine Sulfate Other (See Comments)    REACTION: "out of body experience"--"floats"  . Diphenhydramine Hcl Other (See Comments)    REACTION: headache, vertigo  . Doxycycline Hyclate Other (See Comments)    REACTION: nausea, vomiting  . Hibiclens [Chlorhexidine] Rash    wipes    IV Location/Drains/Wounds Patient Lines/Drains/Airways Status   Active Line/Drains/Airways    None          Labs/Imaging Results for orders placed or performed during the hospital encounter of 11/06/17 (from the past 48 hour(s))  Comprehensive metabolic panel  Status: Abnormal   Collection Time: 11/06/17  8:03 PM  Result Value Ref Range   Sodium 135 135 - 145 mmol/L   Potassium 4.5 3.5 - 5.1 mmol/L   Chloride 99 (L) 101 - 111 mmol/L   CO2 22 22 - 32 mmol/L   Glucose, Bld 97 65 - 99 mg/dL   BUN 86 (H) 6 - 20 mg/dL   Creatinine, Ser 2.36 (H) 0.44 - 1.00 mg/dL   Calcium 8.9 8.9 - 10.3 mg/dL   Total Protein 7.2 6.5 - 8.1 g/dL   Albumin 3.4 (L) 3.5 - 5.0 g/dL   AST 29 15 - 41 U/L   ALT 11 (L) 14 - 54 U/L   Alkaline Phosphatase 126 38 - 126 U/L   Total Bilirubin 0.9 0.3 - 1.2 mg/dL   GFR calc non Af Amer 16 (L) >60 mL/min    GFR calc Af Amer 19 (L) >60 mL/min    Comment: (NOTE) The eGFR has been calculated using the CKD EPI equation. This calculation has not been validated in all clinical situations. eGFR's persistently <60 mL/min signify possible Chronic Kidney Disease.    Anion gap 14 5 - 15    Comment: Performed at [facility], [place] [address], [place], [place] [address]  Troponin I     Status: Abnormal   Collection Time: 11/06/17  8:03 PM  Result Value Ref Range   Troponin I 0.09 (HH) <0.03 ng/mL    Comment: CRITICAL RESULT CALLED TO, READ BACK BY AND VERIFIED WITHRuss Halo RN 2111 11/06/17 A NAVARRO Performed at [facility], [place] [address], [place], [place] [address]   Brain natriuretic peptide     Status: Abnormal   Collection Time: 11/06/17  8:03 PM  Result Value Ref Range   B Natriuretic Peptide 3,112.6 (H) 0.0 - 100.0 pg/mL    Comment: Performed at [facility], [place] [address] [place], [place] [address]  CBC with Differential     Status: Abnormal   Collection Time: 11/06/17  8:03 PM  Result Value Ref Range   WBC 5.2 4.0 - 10.5 K/uL   RBC 4.31 3.87 - 5.11 MIL/uL   Hemoglobin 12.2 12.0 - 15.0 g/dL   HCT 37.6 36.0 - 46.0 %   MCV 87.2 78.0 - 100.0 fL   MCH 28.3 26.0 - 34.0 pg   MCHC 32.4 30.0 - 36.0 g/dL   RDW 18.1 (H) 11.5 - 15.5 %   Platelets 214 150 - 400 K/uL   Neutrophils Relative % 66 %   Neutro Abs 3.5 1.7 - 7.7 K/uL   Lymphocytes Relative 22 %   Lymphs Abs 1.1 0.7 - 4.0 K/uL   Monocytes Relative 10 %   Monocytes Absolute 0.5 0.1 - 1.0 K/uL   Eosinophils Relative 1 %   Eosinophils Absolute 0.0 0.0 - 0.7 K/uL   Basophils Relative 1 %   Basophils Absolute 0.0 0.0 - 0.1 K/uL    Comment: Performed at [facility], [place] [address], [place], [place] [address]  I-Stat CG4 Lactic Acid, ED     Status: Abnormal   Collection Time: 11/06/17  8:57 PM  Result Value Ref Range   Lactic Acid, Venous 2.10 (HH) 0.5 -  1.9 mmol/L   Comment NOTIFIED PHYSICIAN   Blood gas, venous     Status: Abnormal   Collection Time: 11/06/17  9:00 PM  Result Value Ref Range   pH, Ven 7.415 7.250 - 7.430  pCO2, Ven 35.1 (L) 44.0 - 60.0 mmHg   pO2, Ven 36.2 32.0 - 45.0 mmHg   Bicarbonate 22.1 20.0 - 28.0 mmol/L   Acid-base deficit 1.5 0.0 - 2.0 mmol/L   O2 Saturation 62.4 %   Patient temperature 98.6    Collection site VEIN    Drawn by COLLECTED BY NURSE    Sample type VEIN     Comment: Performed at [facility], [place] [address], [place], [place] [address]   Dg Chest 2 View  Result Date: 11/06/2017 CLINICAL DATA:  Shortness of breath EXAM: CHEST - 2 VIEW COMPARISON:  08/10/2017, 08/06/2017, 09/16/2014 FINDINGS: Small pleural effusions. Cardiomegaly with vascular congestion. Aortic atherosclerosis. Left-sided pacing device with similar lead position. No pneumothorax. IMPRESSION: Cardiomegaly with vascular congestion and small pleural effusions. Electronically Signed   By: Donavan Foil M.D.   On: 11/06/2017 20:21    Pending Labs Unresulted Labs (From admission, onward)   Start     Ordered   11/07/17 3128  Basic metabolic panel  Daily,   R     11/06/17 2145      Vitals/Pain Today's Vitals   11/06/17 1943 11/06/17 2030 11/06/17 2100  BP: 122/71 123/65 107/62  Pulse: 64 68 64  Resp: (!) 24 (!) 22 (!) 32  Temp: (!) 97.5 F (36.4 C)    TempSrc: Axillary    SpO2: 94% 97% 96%  PainSc: 6       Isolation Precautions No active isolations  Medications Medications  furosemide (LASIX) 100 mg in dextrose 5 % 50 mL IVPB (100 mg Intravenous New Bag/Given 11/06/17 2126)  sodium chloride flush (NS) 0.9 % injection 3 mL (has no administration in time range)  sodium chloride flush (NS) 0.9 % injection 3 mL (has no administration in time range)  0.9 %  sodium chloride infusion (has no administration in time range)  acetaminophen (TYLENOL) tablet 650 mg (has no administration in time range)  ondansetron  (ZOFRAN) injection 4 mg (has no administration in time range)  heparin injection 5,000 Units (has no administration in time range)  furosemide (LASIX) 100 mg in dextrose 5 % 50 mL IVPB (has no administration in time range)  calcium carbonate (TUMS EX) chewable tablet 1,500 mg (has no administration in time range)  cholecalciferol (VITAMIN D) tablet 1,000 Units (has no administration in time range)  levothyroxine (SYNTHROID, LEVOTHROID) tablet 150 mcg (has no administration in time range)  Lutein TABS 20 mg (has no administration in time range)  meclizine (ANTIVERT) tablet 25 mg (has no administration in time range)  PRESERVISION AREDS 2 CAPS 2 tablet (has no administration in time range)  carboxymethylcellulose 1 % ophthalmic solution 1 drop (has no administration in time range)  albuterol (PROVENTIL) (2.5 MG/3ML) 0.083% nebulizer solution 5 mg (5 mg Nebulization Given 11/06/17 2124)    Mobility non-ambulatory

## 2017-11-06 NOTE — H&P (Signed)
History and Physical    Kara Cruz:[id number] DOB: 06-Jun-1918 DOA: 11/06/2017  PCP: Jarome Matin, MD  Patient coming from: Home  I have personally briefly reviewed patient's old medical records in [facility]  Chief Complaint: CHF  HPI: Kara Cruz is a 84 y.o. female with medical history significant of CHB with PPM, CHF: EF 40-45%, severe AS, Mod-severe MR, severe TR, RVSP 59 and dilated IVC on echo in Jan.  Not felt to be candidate for AS surgery.  Patient presents to the ED from PCPs office with worsening symptoms of CHF: SOB even at rest, anasarca and peripheral edema, 30lb wt gain.  She had been on Demedex 80mg  BID.  [name] recently added but despite this had worsening CHF symptoms.  Was on Zaroxolyn once weekly on discharge after Jan this year but got taken off of this med.   ED Course: Creat 2.2 up from baseline of about 1.9.  Remainder of work up is consistent with CHF and fluid overload (BNP over 3k, pulm edema / vasc congestion on CXR, obvious peripheral edema, etc).  Given 100mg  IV lasix x1 and hospitalist asked to admit.   Review of Systems: As per HPI otherwise 10 point review of systems negative.   Past Medical History:  Diagnosis Date  . Acute on chronic systolic heart failure (HCC)   . Age-related macular degeneration   . Aortic stenosis    a. moderate  . AV heart block    a. 09/2014 Medtronic MRI compatible dual chamber pacemaker.  . Frequent headaches   . Gout   . HTN (hypertension)   . Hypothyroidism   . Osteoarthritis   . Paroxysmal atrial fibrillation (HCC)    a. identified by device interrogation    Past Surgical History:  Procedure Laterality Date  . DILATATION & CURETTAGE/HYSTEROSCOPY WITH MYOSURE    . euflexa into knees    . eye injections    . PERMANENT PACEMAKER INSERTION N/A 09/14/2014   MDT MRI compatible dual chamber pacemaker implanted by Dr Graciela Husbands  . TONSILLECTOMY     1928     reports that she has quit smoking. She  has never used smokeless tobacco. She reports that she does not drink alcohol or use drugs.  Allergies  Allergen Reactions  . Labetalol Swelling    Blisters around lips Swollen lips & eyes, blistering lips, dry mouth  . Latex Itching  . Moxifloxacin Other (See Comments)    Doesn't remember  . Prednisone Nausea And Vomiting    Previously listed as SHOB, but verified with patient/daughter 08/10/17, only N/V as per [facility] medical record  . Pseudoephedrine Hcl Other (See Comments)  . Codeine Sulfate Other (See Comments)    REACTION: "out of body experience"--"floats"  . Diphenhydramine Hcl Other (See Comments)    REACTION: headache, vertigo  . Doxycycline Hyclate Other (See Comments)    REACTION: nausea, vomiting  . Hibiclens [Chlorhexidine] Rash    wipes    Family History  Problem Relation Age of Onset  . Hypertension Mother   . Arthritis Mother   . Stroke Mother   . Heart attack Father   . Stroke Brother   . Prostate cancer Brother   . Heart disease Brother   . Kidney failure Brother   . Heart disease Brother   . Arthritis Brother   . Edema Brother      Prior to Admission medications   Medication Sig Start Date End Date Taking? Authorizing Provider  calcium carbonate (  TUMS EX) 750 MG chewable tablet Chew 2 tablets by mouth daily.   Yes [provider]  cholecalciferol (VITAMIN D) 1000 UNITS tablet Take 1,000 Units by mouth daily.   Yes [provider]  eplerenone (INSPRA) 25 MG tablet Take 12.5 mg by mouth daily. 10/21/17  Yes [provider]  levothyroxine (SYNTHROID, LEVOTHROID) 150 MCG tablet Take 1 tablet (150 mcg total) by mouth daily before breakfast. 07/30/16  Yes Laverda Pageoberts, Lindsay B, NP  Lutein 20 MG TABS Take 20 mg by mouth daily.    Yes [provider]  meclizine (ANTIVERT) 25 MG tablet Take 25 mg by mouth daily as needed for dizziness (or vertigo).    Yes [provider]  Multiple Vitamins-Minerals (PRESERVISION  AREDS 2) CAPS Take 2 tablets by mouth daily.   Yes [provider]  Polyvinyl Alcohol-Povidone (REFRESH OP) Place 1 drop into both eyes at bedtime.   Yes [provider]  Potassium 99 MG TABS Take 198 mg by mouth daily. 2 tablets in the AM and 1 tablet in the evening   Yes [provider]  torsemide (DEMADEX) 20 MG tablet Take 4 tablets (80 mg total) by mouth 2 (two) times daily. 08/13/17  Yes Vassie LollMadera, Carlos, MD    Physical Exam: Vitals:   11/06/17 1943 11/06/17 2030 11/06/17 2100  BP: 122/71 123/65 107/62  Pulse: 64 68 64  Resp: (!) 24 (!) 22 (!) 32  Temp: (!) 97.5 F (36.4 C)    TempSrc: Axillary    SpO2: 94% 97% 96%    Constitutional: NAD, calm, comfortable Eyes: PERRL, lids and conjunctivae normal ENMT: Mucous membranes are moist. Posterior pharynx clear of any exudate or lesions.Normal dentition.  Neck: normal, supple, no masses, no thyromegaly Respiratory: Tachypnic with accessory muscles Cardiovascular: Murmur present, 4+ edema / anasarca of BLE and L arm. Abdomen: no tenderness, no masses palpated. No hepatosplenomegaly. Bowel sounds positive.  Musculoskeletal: no clubbing / cyanosis. No joint deformity upper and lower extremities. Good ROM, no contractures. Normal muscle tone.  Skin: no rashes, lesions, ulcers. No induration Neurologic: CN 2-12 grossly intact. Sensation intact, DTR normal. Strength 5/5 in all 4.  Psychiatric: Normal judgment and insight. Alert and oriented x 3. Normal mood.    Labs on Admission: I have personally reviewed following labs and imaging studies  CBC: Recent Labs  Lab 11/06/17 2003  WBC 5.2  NEUTROABS 3.5  HGB 12.2  HCT 37.6  MCV 87.2  PLT 214   Basic Metabolic Panel: Recent Labs  Lab 11/06/17 2003  NA 135  K 4.5  CL 99*  CO2 22  GLUCOSE 97  BUN 86*  CREATININE 2.36*  CALCIUM 8.9   GFR: CrCl cannot be calculated (Unknown ideal weight.). Liver Function Tests: Recent Labs  Lab 11/06/17 2003  AST  29  ALT 11*  ALKPHOS 126  BILITOT 0.9  PROT 7.2  ALBUMIN 3.4*   No results for input(s): LIPASE, AMYLASE in the last 168 hours. No results for input(s): AMMONIA in the last 168 hours. Coagulation Profile: No results for input(s): INR, PROTIME in the last 168 hours. Cardiac Enzymes: Recent Labs  Lab 11/06/17 2003  TROPONINI 0.09*   BNP (last 3 results) No results for input(s): PROBNP in the last 8760 hours. HbA1C: No results for input(s): HGBA1C in the last 72 hours. CBG: No results for input(s): GLUCAP in the last 168 hours. Lipid Profile: No results for input(s): CHOL, HDL, LDLCALC, TRIG, CHOLHDL, LDLDIRECT in the last 72 hours. Thyroid  Function Tests: No results for input(s): TSH, T4TOTAL, FREET4, T3FREE, THYROIDAB in the last 72 hours. Anemia Panel: No results for input(s): VITAMINB12, FOLATE, FERRITIN, TIBC, IRON, RETICCTPCT in the last 72 hours. Urine analysis: No results found for: COLORURINE, APPEARANCEUR, LABSPEC, PHURINE, GLUCOSEU, HGBUR, BILIRUBINUR, KETONESUR, PROTEINUR, UROBILINOGEN, NITRITE, LEUKOCYTESUR  Radiological Exams on Admission: Dg Chest 2 View  Result Date: 11/06/2017 CLINICAL DATA:  Shortness of breath EXAM: CHEST - 2 VIEW COMPARISON:  08/10/2017, 08/06/2017, 09/16/2014 FINDINGS: Small pleural effusions. Cardiomegaly with vascular congestion. Aortic atherosclerosis. Left-sided pacing device with similar lead position. No pneumothorax. IMPRESSION: Cardiomegaly with vascular congestion and small pleural effusions. Electronically Signed   By: Jasmine Pang M.D.   On: 11/06/2017 20:21    EKG: Independently reviewed.  Assessment/Plan Principal Problem:   Acute on chronic combined systolic and diastolic CHF (congestive heart failure) (HCC) Active Problems:   Hypothyroidism   Complete heart block (HCC)   Nonrheumatic aortic valve stenosis   Edema   Acute renal failure superimposed on stage 3 chronic kidney disease (HCC)    1. Acute on chronic  combined CHF - no question that patient is fluid overloaded with anasarca, 30lbs wt gain, etc. 1. CHF pathway 2. Lasix 100mg  IV BID ordered to start 3. Message put in to P. Trent for cards consult in AM. 4. Daily BMP 5. Tele monitor 2. Edema - due to #1 above 1. Note that the LUE> Cruz edema was also present during last admit in Jan, [place] neg for DVT that time, therefore will hold off on repeating this. 3. Aortic stenosis - 1. Pal care consult in AM as well.  Long term prognosis of cardiorenal syndrome, severe AS non-op candidate is probably not great (was on hospice earlier in year I think) 4. AKI on CKD stage 3 - 1. Cardiorenal syndrome? 2. Daily BMP with diuresis 5. Hypothyroidism - cont synthroid 6. PAF / AV CHB - 1. PPM rate controlled 2. CHADS vasc of 4 3. Not anticoagulation candidate  DVT prophylaxis: Heparin [place] Code Status: DNR Family Communication: Daughter at bedside Disposition Plan: Home after admit Consults called: None called, put in consults to pal care and cardiology in Epic. Admission status: Admit to inpatient   Hillary Bow DO [facility] Pager [phone]  If 7AM-7PM, please contact day team taking care of patient [email or web address] Password [email or web address]  11/06/2017, 10:18 PM

## 2017-11-07 ENCOUNTER — Other Ambulatory Visit: Payer: Self-pay

## 2017-11-07 ENCOUNTER — Encounter (HOSPITAL_COMMUNITY): Payer: Self-pay | Admitting: Cardiology

## 2017-11-07 ENCOUNTER — Inpatient Hospital Stay (HOSPITAL_COMMUNITY): Payer: Medicare Other

## 2017-11-07 DIAGNOSIS — I5043 Acute on chronic combined systolic (congestive) and diastolic (congestive) heart failure: Secondary | ICD-10-CM

## 2017-11-07 DIAGNOSIS — I361 Nonrheumatic tricuspid (valve) insufficiency: Secondary | ICD-10-CM

## 2017-11-07 DIAGNOSIS — I509 Heart failure, unspecified: Secondary | ICD-10-CM

## 2017-11-07 DIAGNOSIS — I350 Nonrheumatic aortic (valve) stenosis: Secondary | ICD-10-CM

## 2017-11-07 DIAGNOSIS — I442 Atrioventricular block, complete: Secondary | ICD-10-CM

## 2017-11-07 DIAGNOSIS — N183 Chronic kidney disease, stage 3 (moderate): Secondary | ICD-10-CM

## 2017-11-07 DIAGNOSIS — I340 Nonrheumatic mitral (valve) insufficiency: Secondary | ICD-10-CM

## 2017-11-07 DIAGNOSIS — N179 Acute kidney failure, unspecified: Secondary | ICD-10-CM

## 2017-11-07 LAB — BASIC METABOLIC PANEL
ANION GAP: 11 (ref 5–15)
BUN: 84 mg/dL — ABNORMAL HIGH (ref 6–20)
CHLORIDE: 101 mmol/L (ref 101–111)
CO2: 22 mmol/L (ref 22–32)
Calcium: 8.8 mg/dL — ABNORMAL LOW (ref 8.9–10.3)
Creatinine, Ser: 2.35 mg/dL — ABNORMAL HIGH (ref 0.44–1.00)
GFR calc Af Amer: 19 mL/min — ABNORMAL LOW (ref >60)
GFR calc non Af Amer: 16 mL/min — ABNORMAL LOW (ref >60)
GLUCOSE: 97 mg/dL (ref 65–99)
Potassium: 4 mmol/L (ref 3.5–5.1)
Sodium: 134 mmol/L — ABNORMAL LOW (ref 135–145)

## 2017-11-07 LAB — ECHOCARDIOGRAM COMPLETE
HEIGHTINCHES: 62.5 in
Weight: 2437.41 oz

## 2017-11-07 MED ORDER — METOLAZONE 5 MG PO TABS
5.0000 mg | ORAL_TABLET | Freq: Once | ORAL | Status: AC
  Administered 2017-11-07: 5 mg via ORAL
  Filled 2017-11-07: qty 1

## 2017-11-07 MED ORDER — FUROSEMIDE 10 MG/ML IJ SOLN
120.0000 mg | Freq: Three times a day (TID) | INTRAVENOUS | Status: DC
  Administered 2017-11-07 – 2017-11-11 (×12): 120 mg via INTRAVENOUS
  Filled 2017-11-07 (×2): qty 10
  Filled 2017-11-07 (×2): qty 12
  Filled 2017-11-07: qty 2
  Filled 2017-11-07: qty 12
  Filled 2017-11-07 (×2): qty 10
  Filled 2017-11-07: qty 12
  Filled 2017-11-07: qty 10
  Filled 2017-11-07: qty 2
  Filled 2017-11-07: qty 12
  Filled 2017-11-07 (×3): qty 10

## 2017-11-07 NOTE — Progress Notes (Addendum)
PROGRESS NOTE    Kara Cruz  NFA:[id number] DOB: 1918-04-27 DOA: 11/06/2017 PCP: Jarome Matin, MD  Outpatient Specialists:   Brief Narrative: 84 year old female with history of combined CHF, aortic stenosis, CKD Stage 3, Hypothyroidism and PAF/AV CHB. Patient was admitted from ED on 08/06/17 due to acute on chronic CHF. On discharge, was given Demadex 80 mg BID. Since then, PCP added Inspra, patient states that she took first dose and had reaction of face swelling and rash, not on med now. Sent to the ED from PCP office on 4/4 complaining of shortness of breath at rest, general swelling, and 30 lb weight gain. Given 100 mg IV Lasix once and admitted with working diagnosis of acute on chronic combined CHF. Today patient feels "very tired and weak" and continues to have swelling of LUE, abdomen and bilateral legs.   Assessment & Plan:   1. Acute on chronic combined CHF  - At admission, BNP >3000, cardiomegaly with vascular congestion on CXR, BLE edema, history of similar symptoms during admission in January - Echo ordered, waiting for results, Echo 1/3 showed EF 40-45%, severe aortic stenosis sever MR, sever TR, RVSP of 59 mmHg, and dilated IVC - Received 100 mg Lasix once 4/4, ordered 120 mg Lasix q 8 hrs - Cardiology consulted - Zaroxolyn 5 mg once ordered  2. Edema of LUE, abdomen and BLE - most likely due to acute on chronic combined CHF -similar LUE edema without RUE edema was found at last admission in Jan, but this seems to be worse - 08/07/17 LUE and BLE [place] reviewed with no signs of LUE DVT  3. Severe Aortic stenosis - Palliative care consult was ordered for today, not a surgical candidate  4. PAF/ AV CHB - rate controlled with pacemaker -CHADSVASc score 4 - Not a candidate for anticoagulation  5. CKD Stage IV/V: - Creatinine 2.35, mildly elevated from last discharge Creatinine 2.1  6. Hypothyroidism - Continue Synthroid   DVT prophylaxis: Heparin [place] Code Status:  DNR Family Communication: Daughter at bedside Disposition Plan: Home when clinically stable   Consultants:  - Cardiology - Palliative Care  Procedures:   Echocardiogram   Antimicrobials:   Subjective: Patient is alert, oriented, and easily able to communicate symptoms. Complains of feeling week, tired, and generalized swelling.   Objective: Vitals:   11/06/17 2200 11/06/17 2230 11/06/17 2301 11/07/17 0612  BP: 126/70 111/68 124/68 118/67  Pulse: 72 68 73 66  Resp: 20 (!) 24  18  Temp:   98 F (36.7 C) 97.7 F (36.5 C)  TempSrc:   Axillary Oral  SpO2: 96% 95% 95% 97%  Weight:   69.1 kg (152 lb 5.4 oz) 69.1 kg (152 lb 5.4 oz)  Height:   5' 2.5" (1.588 m)     Intake/Output Summary (Last 24 hours) at 11/07/2017 1145 Last data filed at 11/07/2017 0800 Gross per 24 hour  Intake 300 ml  Output 200 ml  Net 100 ml   Filed Weights   11/06/17 2301 11/07/17 0612  Weight: 69.1 kg (152 lb 5.4 oz) 69.1 kg (152 lb 5.4 oz)    Examination:  General exam: Appears calm and comfortable, in no acute distress. Frail elderly female Respiratory system: Clear to auscultation. Mildly tachypneic.  Cardiovascular system: S1 & S2 heard, RRR with murmur.  JVD present. 2+ pitting edema of left upper extremity and bilateral lower extremities.  Gastrointestinal system: Abdomen is edematous, but soft and nontender. Normal bowel sounds heard. Central nervous system:  Alert and oriented. No focal neurological deficits. Extremities: Symmetric 5 x 5 power.  Skin: No rashes, lesions or ulcers Psychiatry: Judgement and insight appear normal. Mood & affect appropriate.   Data Reviewed: I have personally reviewed following labs and imaging studies  CBC: Recent Labs  Lab 11/06/17 2003  WBC 5.2  NEUTROABS 3.5  HGB 12.2  HCT 37.6  MCV 87.2  PLT 214   Basic Metabolic Panel: Recent Labs  Lab 11/06/17 2003 11/07/17 0514  NA 135 134*  K 4.5 4.0  CL 99* 101  CO2 22 22  GLUCOSE 97 97  BUN 86*  84*  CREATININE 2.36* 2.35*  CALCIUM 8.9 8.8*   GFR: Estimated Creatinine Clearance: 12.6 mL/min (A) (by C-G formula based on SCr of 2.35 mg/dL (H)). Liver Function Tests: Recent Labs  Lab 11/06/17 2003  AST 29  ALT 11*  ALKPHOS 126  BILITOT 0.9  PROT 7.2  ALBUMIN 3.4*   No results for input(s): LIPASE, AMYLASE in the last 168 hours. No results for input(s): AMMONIA in the last 168 hours. Coagulation Profile: No results for input(s): INR, PROTIME in the last 168 hours. Cardiac Enzymes: Recent Labs  Lab 11/06/17 2003  TROPONINI 0.09*   BNP (last 3 results) No results for input(s): PROBNP in the last 8760 hours. HbA1C: No results for input(s): HGBA1C in the last 72 hours. CBG: No results for input(s): GLUCAP in the last 168 hours. Lipid Profile: No results for input(s): CHOL, HDL, LDLCALC, TRIG, CHOLHDL, LDLDIRECT in the last 72 hours. Thyroid Function Tests: No results for input(s): TSH, T4TOTAL, FREET4, T3FREE, THYROIDAB in the last 72 hours. Anemia Panel: No results for input(s): VITAMINB12, FOLATE, FERRITIN, TIBC, IRON, RETICCTPCT in the last 72 hours. Urine analysis: No results found for: COLORURINE, APPEARANCEUR, LABSPEC, PHURINE, GLUCOSEU, HGBUR, BILIRUBINUR, KETONESUR, PROTEINUR, UROBILINOGEN, NITRITE, LEUKOCYTESUR Sepsis Labs: @LABRCNTIP (procalcitonin:4,lacticidven:4)  )No results found for this or any previous visit (from the past 240 hour(s)).    Radiology Studies: Dg Chest 2 View  Result Date: 11/06/2017 CLINICAL DATA:  Shortness of breath EXAM: CHEST - 2 VIEW COMPARISON:  08/10/2017, 08/06/2017, 09/16/2014 FINDINGS: Small pleural effusions. Cardiomegaly with vascular congestion. Aortic atherosclerosis. Left-sided pacing device with similar lead position. No pneumothorax. IMPRESSION: Cardiomegaly with vascular congestion and small pleural effusions. Electronically Signed   By: Jasmine PangKim  Fujinaga M.D.   On: 11/06/2017 20:21     Scheduled Meds: . calcium  carbonate  1,000 mg Oral Daily  . cholecalciferol  1,000 Units Oral Daily  . heparin  5,000 Units Subcutaneous Q8H  . levothyroxine  150 mcg Oral QAC breakfast  . multivitamin  2 tablet Oral Daily  . polyvinyl alcohol  1 drop Both Eyes QHS  . sodium chloride flush  3 mL Intravenous Q12H   Continuous Infusions: . sodium chloride    . furosemide 120 mg (11/07/17 1110)     LOS: 1 day    Time spent: 25 mins    Janine OresBlaine Temperence Zenor, PA-S [facility] If 7PM-7AM, please contact night-coverage [email or web address] Password [email or web address] 11/07/2017, 11:45 AM

## 2017-11-07 NOTE — Progress Notes (Signed)
   11/07/17 1657  Clinical Encounter Type  Visited With Patient and family together  Visit Type Initial  Spiritual Encounters  Spiritual Needs Prayer   Rounding on Palliative patients.  Patient was in the bed with daughter (only child) at bedside.  Patient indicated she was not doing too well today and then dozed off.  Stayed asleep most of the visit.  Supported the daughter who shared much about the patient and their family.  Daughter went on and on about much they appreciate the staff on the 4th floor and how wonderful everyone is.  Will follow and support as needed. Chaplain Agustin CreeNewton Kanaan Kagawa

## 2017-11-07 NOTE — Care Management Note (Signed)
Case Management Note  Patient Details  Name: Kara Cruz MRN: [name] Date of Birth: 03/27/1918  Subjective/Objective:  From home. PT cons-await recc.                  Action/Plan:d/c home w/HHC   Expected Discharge Date:                  Expected Discharge Plan:  Home w Home Health Services  In-House Referral:     Discharge planning Services  CM Consult  Post Acute Care Choice:    Choice offered to:     DME Arranged:    DME Agency:     HH Arranged:    HH Agency:     Status of Service:  In process, will continue to follow  If discussed at Long Length of Stay Meetings, dates discussed:    Additional Comments:  Lanier ClamMahabir, Zylon Creamer, RN 11/07/2017, 10:13 AM

## 2017-11-07 NOTE — Progress Notes (Signed)
Assumed care of this patient from previous RN. Agree with prior assessment. Will continue to monitor patient closely.  

## 2017-11-07 NOTE — Progress Notes (Signed)
  Echocardiogram 2D Echocardiogram has been performed.  Attempted to use pedoff probe in order to better assess aortic valve stenosis; however, patient stated the small probe hitting her ribs was too much to take even with little pressure.    Delorese Sellin L Androw 11/07/2017, 8:57 AM

## 2017-11-07 NOTE — Consult Note (Addendum)
Cardiology Consultation:   Patient ID: Kara Cruz; [id number]; 02-17-1918   Admit date: 11/06/2017 Date of Consult: 11/07/2017  Primary Care Provider: Jarome Matin, MD Primary Cardiologist: [name], MD  Primary Electrophysiologist:  Dr. Graciela Husbands    Patient Profile:   Kara Cruz is a 84 y.o. female with a hx of severe stage D aortic stenosis on comfort care, PAF, CHB with PPM, and chronic systolic and diastolic heart failure who is being seen today for the evaluation of acute on chronic HF due to AS at the request of Dr. Edward Jolly.  History of Present Illness:   Kara Cruz has a hx of severe stage D aortic stenosis on comfort care, PAF, CHB with PPM, and chronic systolic and diastolic heart failure with hospitalization in Jan. 2019 for edema, AKI, and heart failure.  Her hospice was stopped so pt could have PT.  Pt had refused TAVR and is not candidate for standard AV replacement.  She has CKD stage IV to V.   As outpt, inspra was added and pt seemed to do worse with this, though she is fragile anyway, but sore throat, cough and rash and increased swelling.  Not on now.   Now sent to hospital by PCP Dr. Jarold Motto for increased dyspnea and increased edema.  Especially Lt arm. It was tender as well.   EKG AV pacing and sensing I personally reviewed.   Tele AV pacing  Na 134, K+ 4.0, Cr 2.35, BUN 84  BNP 3,112 Troponin 0.09  Lactic acid 2.10 WBC 5.2, Hgb 12.2, Plts 214   2V CXR  Cardiomegaly with vascular congestion and small pleural effusions.  Now on 120 mg IV lasix every 8 hours  No chest pain and no SOB at rest .  Resting comfortably now.     Past Medical History:  Diagnosis Date  . Acute on chronic systolic heart failure (HCC)   . Age-related macular degeneration   . Aortic stenosis    a. moderate  . AV heart block    a. 09/2014 Medtronic MRI compatible dual chamber pacemaker.  . Frequent headaches   . Gout   . HTN (hypertension)   . Hypothyroidism   .  Osteoarthritis   . Paroxysmal atrial fibrillation (HCC)    a. identified by device interrogation    Past Surgical History:  Procedure Laterality Date  . DILATATION & CURETTAGE/HYSTEROSCOPY WITH MYOSURE    . euflexa into knees    . eye injections    . PERMANENT PACEMAKER INSERTION N/A 09/14/2014   MDT MRI compatible dual chamber pacemaker implanted by Dr Graciela Husbands  . TONSILLECTOMY     1928     Home Medications:  Prior to Admission medications   Medication Sig Start Date End Date Taking? Authorizing Provider  calcium carbonate (TUMS EX) 750 MG chewable tablet Chew 2 tablets by mouth daily.   Yes [provider]  cholecalciferol (VITAMIN D) 1000 UNITS tablet Take 1,000 Units by mouth daily.   Yes [provider]  eplerenone (INSPRA) 25 MG tablet Take 12.5 mg by mouth daily. 10/21/17  Yes [provider]  levothyroxine (SYNTHROID, LEVOTHROID) 150 MCG tablet Take 1 tablet (150 mcg total) by mouth daily before breakfast. 07/30/16  Yes Laverda Page B, NP  Lutein 20 MG TABS Take 20 mg by mouth daily.    Yes [provider]  meclizine (ANTIVERT) 25 MG tablet Take 25 mg by mouth daily as needed for dizziness (or vertigo).    Yes [provider]  Multiple Vitamins-Minerals (PRESERVISION AREDS 2) CAPS Take 2 tablets by mouth daily.   Yes [provider]  Polyvinyl Alcohol-Povidone (REFRESH OP) Place 1 drop into both eyes at bedtime.   Yes [provider]  Potassium 99 MG TABS Take 198 mg by mouth daily. 2 tablets in the AM and 1 tablet in the evening   Yes [provider]  torsemide (DEMADEX) 20 MG tablet Take 4 tablets (80 mg total) by mouth 2 (two) times daily. 08/13/17  Yes Vassie Loll, MD    Inpatient Medications: Scheduled Meds: . calcium carbonate  1,000 mg Oral Daily  . cholecalciferol  1,000 Units Oral Daily  . heparin  5,000 Units Subcutaneous Q8H  . levothyroxine  150 mcg Oral QAC breakfast  . multivitamin  2  tablet Oral Daily  . polyvinyl alcohol  1 drop Both Eyes QHS  . sodium chloride flush  3 mL Intravenous Q12H   Continuous Infusions: . sodium chloride    . furosemide     PRN Meds: sodium chloride, acetaminophen, meclizine, ondansetron (ZOFRAN) IV, sodium chloride flush  Allergies:    Allergies  Allergen Reactions  . Labetalol Swelling    Blisters around lips Swollen lips & eyes, blistering lips, dry mouth  . Latex Itching  . Moxifloxacin Other (See Comments)    Doesn't remember  . Prednisone Nausea And Vomiting    Previously listed as SHOB, but verified with patient/daughter 08/10/17, only N/V as per [facility] medical record  . Pseudoephedrine Hcl Other (See Comments)  . Codeine Sulfate Other (See Comments)    REACTION: "out of body experience"--"floats"  . Diphenhydramine Hcl Other (See Comments)    REACTION: headache, vertigo  . Doxycycline Hyclate Other (See Comments)    REACTION: nausea, vomiting  . Hibiclens [Chlorhexidine] Rash    wipes    Social History:   Social History   Socioeconomic History  . Marital status: Widowed    Spouse name: Not on file  . Number of children: Not on file  . Years of education: Not on file  . Highest education level: Not on file  Occupational History  . Not on file  Social Needs  . Financial resource strain: Not on file  . Food insecurity:    Worry: Not on file    Inability: Not on file  . Transportation needs:    Medical: Not on file    Non-medical: Not on file  Tobacco Use  . Smoking status: Former Games developer  . Smokeless tobacco: Never Used  Substance and Sexual Activity  . Alcohol use: No  . Drug use: No  . Sexual activity: Never  Lifestyle  . Physical activity:    Days per week: Not on file    Minutes per session: Not on file  . Stress: Not on file  Relationships  . Social connections:    Talks on phone: Not on file    Gets together: Not on file    Attends religious service: Not on file    Active member of club  or organization: Not on file    Attends meetings of clubs or organizations: Not on file    Relationship status: Not on file  . Intimate partner violence:    Fear of current or ex partner: Not on file    Emotionally abused: Not on file    Physically abused: Not on file    Forced sexual activity: Not on file  Other Topics Concern  . Not on file  Social History Narrative  . Not on file    Family History:    Family History  Problem Relation Age of Onset  . Hypertension Mother   . Arthritis Mother   . Stroke Mother   . Heart attack Father   . Stroke Brother   . Prostate cancer Brother   . Heart disease Brother   . Kidney failure Brother   . Heart disease Brother   . Arthritis Brother   . Edema Brother      ROS:  Please see the history of present illness.  General:no colds or fevers, + weight increase Skin:+ abd rash no ulcers, some fluid blisters on legs HEENT:no blurred vision, no congestion CV:see HPI PUL:see HPI GI:no diarrhea constipation or melena, no indigestion GU:no hematuria, no dysuria MS:no joint pain, no claudication Neuro:no syncope, no lightheadedness Endo:no diabetes, no thyroid disease  All other ROS reviewed and negative.     Physical Exam/Data:   Vitals:   11/06/17 2200 11/06/17 2230 11/06/17 2301 11/07/17 0612  BP: 126/70 111/68 124/68 118/67  Pulse: 72 68 73 66  Resp: 20 (!) 24  18  Temp:   98 F (36.7 C) 97.7 F (36.5 C)  TempSrc:   Axillary Oral  SpO2: 96% 95% 95% 97%  Weight:   152 lb 5.4 oz (69.1 kg) 152 lb 5.4 oz (69.1 kg)  Height:   5' 2.5" (1.588 m)     Intake/Output Summary (Last 24 hours) at 11/07/2017 0811 Last data filed at 11/07/2017 0557 Gross per 24 hour  Intake 60 ml  Output 200 ml  Net -140 ml   Filed Weights   11/06/17 2301 11/07/17 0612  Weight: 152 lb 5.4 oz (69.1 kg) 152 lb 5.4 oz (69.1 kg)   Body mass index is 27.42 kg/m.  General:  Frail elderly female  in no acute distress  HEENT: normal Lymph: no  adenopathy Neck: ++ JVD Endocrine:  No thryomegaly Vascular: No carotid bruits; 1+ pedal puses  Cardiac:  normal S1, S2; RRR with harsh systolic murmur 3/6 Lungs:  Bilateral breath sounds to auscultation bilaterally, no wheezing, rhonchi + rales in bases  Abd: soft, nontender, no hepatomegaly  Ext: 3-4 + edema of lower ext  and 2+ in post thighs to hips Musculoskeletal:  No deformities, BUE and BLE strength normal and equal Skin: warm and dry  Neuro:  Alert and oriented X 3 MAE, no focal abnormalities noted Psych:  Normal to flat affect    Relevant CV Studies: Echo 08/07/17 Study Conclusions  - Left ventricle: The cavity size was normal. Wall thickness was   normal. Systolic function was mildly to moderately reduced. The   estimated ejection fraction was in the range of 40% to 45%.   Incoordinate septal motion. The study is not technically   sufficient to allow evaluation of LV diastolic function. - Aortic valve: Calcified leaflets. Severe stenosis. Trivial   regurgitation. Mean gradient (S): 40 mm Hg. Peak gradient (S): 63   mm Hg. Valve area (VTI): 0.43 cm^2. Valve area (Vmax): 0.53 cm^2. - Mitral valve: Calcified annulus. Mildly thickened leaflets .   There was moderate to severe eccentric regurgitation. - Left atrium: Moderately dilated. - Right ventricle: The cavity size was moderately dilated. The   moderator band was prominent. Pacer wire or catheter noted in   right ventricle. Moderately reduced systolic function. - Right atrium: The atrium was mildly dilated. Pacer wire or   catheter noted in right atrium. - Tricuspid valve: Severe regurgitation. -  Pulmonary arteries: PA peak pressure: 59 mm Hg (S). - Inferior vena cava: The vessel was dilated. The respirophasic   diameter changes were blunted (< 50%), consistent with elevated   central venous pressure.  Impressions:  - Compared to a prior study in 2017, the LVEF is unchanged at   40-45%, however, there is now  severe aortic stenosis with a mean   gradient of 40 mmHg and calculated AVA around 0.4-0.5 cm2, the   left atrium is moderately dilated, ther is moderate to severe MR,   severe TR, RVSP of 59 mmHg, and a dilated IVC.   Laboratory Data:  Chemistry Recent Labs  Lab 11/06/17 2003 11/07/17 0514  NA 135 134*  K 4.5 4.0  CL 99* 101  CO2 22 22  GLUCOSE 97 97  BUN 86* 84*  CREATININE 2.36* 2.35*  CALCIUM 8.9 8.8*  GFRNONAA 16* 16*  GFRAA 19* 19*  ANIONGAP 14 11    Recent Labs  Lab 11/06/17 2003  PROT 7.2  ALBUMIN 3.4*  AST 29  ALT 11*  ALKPHOS 126  BILITOT 0.9   Hematology Recent Labs  Lab 11/06/17 2003  WBC 5.2  RBC 4.31  HGB 12.2  HCT 37.6  MCV 87.2  MCH 28.3  MCHC 32.4  RDW 18.1*  PLT 214   Cardiac Enzymes Recent Labs  Lab 11/06/17 2003  TROPONINI 0.09*   No results for input(s): TROPIPOC in the last 168 hours.  BNP Recent Labs  Lab 11/06/17 2003  BNP 3,112.6*    DDimer No results for input(s): DDIMER in the last 168 hours.  Radiology/Studies:  Dg Chest 2 View  Result Date: 11/06/2017 CLINICAL DATA:  Shortness of breath EXAM: CHEST - 2 VIEW COMPARISON:  08/10/2017, 08/06/2017, 09/16/2014 FINDINGS: Small pleural effusions. Cardiomegaly with vascular congestion. Aortic atherosclerosis. Left-sided pacing device with similar lead position. No pneumothorax. IMPRESSION: Cardiomegaly with vascular congestion and small pleural effusions. Electronically Signed   By: Jasmine Pang M.D.   On: 11/06/2017 20:21    Assessment and Plan:   1. Acute CHF due to severe AS end stage, wt is up 30 lbs from 09/16/17.  IV diuretics now lasix drip --agree with resuming palliative care. Hospice as well.  Now with anasarca.    Neg 140 ml.  With one dose of 120 mg lasix.   2.         AKI Cr 2.36 --2.12 when left [facility] in Jan.  CKD-4-5   3.         Hx CHB with PPM   4.        PAF with CHADS of 4 but not anticoagulation candidate.  AV pacing and sensing, appears to be SR.     For questions or updates, please contact [facility] Please consult [email or web address] for contact info under Cardiology/STEMI.   Signed, Nada Boozer, NP  11/07/2017 8:11 AM   I have seen and examined the patient along with Nada Boozer, NP .  I have reviewed the chart, notes and new data.  I agree with NP's note.  Key new complaints: dyspnea at rest, generalized edema Key examination changes: edema is most prominent in the LUE due to pacemaker-lead related occlusion of the L subclavian vein with clearly visible chest skin collateral veins. She lower extremity swelling to the pelvis/anasarca, JVD to angle of jaw. AS murmur is barely audible and late peaking. Cannot hear S2 Key new findings / data: elevated creatinine  PLAN: Terminal AS. Prognosis likely measurable in weeks,  rather than months. Primary goal is symptom relief Other than ever-escalating doses of loop diuretics (and I will add a one-time dose of metolazone), there is nothing to offer. Aggressive diuresis will lead to worsening renal failure (I would stop checking creatinine levels) and will be limited by hypotension. Encouraged her to consider morphine for its effects on calming dyspnea, not to think of it solely as a pain medication or a sedative.  Thurmon FairMihai Keeshawn Fakhouri, MD, [facility] ClinicFACC [facility] [phone] 11/07/2017, 12:38 PM

## 2017-11-07 NOTE — Consult Note (Signed)
Consultation Note Date: 11/07/2017   Patient Name: Kara Cruz  DOB: October 25, 1917  MRN: [id number]  Age / Sex: 84 y.o., female  PCP: Jarome Matin, MD Referring Physician: Randel Pigg, Dorma Russell, MD  Reason for Consultation: Establishing goals of care  HPI/Patient Profile: 84 y.o. female   admitted on 11/06/2017   Clinical Assessment and Goals of Care:  84 year old lady who lives at home with her daughter. She has stage D aortic stenosis, paroxysmal atrial fibrillation. She has complete heart block and is status post permanent pacemaker placement. She has critical aortic stenosis. She was briefly enrolled in home with hospice services from July 2018 through January 2019.  All the course of the past week or 2, the patient has been experiencing increased shortness of breath, increased generalized edema, increased wheezing. She is able to walk a few distances at home but has found it difficult to do so since the last week or 2 with worsening edema and worsening shortness of breath. Patient saw her primary care physician and was referred to the hospital.  A palliative consultation has been requested for goals of care discussions. Patient has been placed on IV diureses, is being seen by cardiology colleagues, has also undergone surface echocardiogram.  Patient is an elderly lady resting in bed. She is able to answer all questions appropriately. She states she feels so-so. She does not feel acutely short of breath while resting in the hospital bed. Daughter is present at the bedside. I introduced myself and palliative care as follows: Palliative medicine is specialized medical care for people living with serious illness. It focuses on providing relief from the symptoms and stress of a serious illness. The goal is to improve quality of life for both the patient and the family.  Goals wishes and values discussed. Patient's  daughter states that when the patient was enrolled in hospice services in the used to give her morphine it did not help her. She does not want durable medical equipment such as a hospital bed, states that the patient sleeps on a sofa anyway. If the patient should have acute decline or ongoing decline, patient's daughter wants home health services such as were provided to [facility] few months ago. She does not want to comfort measures only approach at this point. Offered supportive care and active listening and voiced understanding. See additional recommendations below. Thank you for the consult.  NEXT OF KIN  lives at home with daughter  SUMMARY OF RECOMMENDATIONS    discussed with patient and daughter. For now, daughter wishes for reinstating [facility] at home - home health care after discharge. Goals at this time are not for comfort measures only, are not for use of opioids, not for hospice will also feel of care, are not for transfer to residential hospice. We will simply have to continue to monitor disease trajectory and hospital course and continue gentle conversations with patient and daughter regarding the serious, terminal, incurable nature of her illness - terminal aortic stenosis that signifies a limited prognosis.  Code  Status/Advance Care Planning:  DNR    Symptom Management:    continue current mode of care  Palliative Prophylaxis:   Bowel Regimen   Psycho-social/Spiritual:   Desire for further Chaplaincy support:yes  Additional Recommendations: Education on Hospice  Prognosis:   < 4 weeks  Discharge Planning: Home with Home Health      Primary Diagnoses: Present on Admission: . Acute on chronic combined systolic and diastolic CHF (congestive heart failure) (HCC) . Acute renal failure superimposed on stage 3 chronic kidney disease (HCC) . Hypothyroidism . Complete heart block (HCC) . Edema   I have reviewed the medical record, interviewed the patient and  family, and examined the patient. The following aspects are pertinent.  Past Medical History:  Diagnosis Date  . Acute on chronic systolic heart failure (HCC)   . Age-related macular degeneration   . Aortic stenosis    a. moderate  . AV heart block    a. 09/2014 Medtronic MRI compatible dual chamber pacemaker.  . Frequent headaches   . Gout   . HTN (hypertension)   . Hypothyroidism   . Osteoarthritis   . Paroxysmal atrial fibrillation (HCC)    a. identified by device interrogation   Social History   Socioeconomic History  . Marital status: Widowed    Spouse name: Not on file  . Number of children: Not on file  . Years of education: Not on file  . Highest education level: Not on file  Occupational History  . Not on file  Social Needs  . Financial resource strain: Not on file  . Food insecurity:    Worry: Not on file    Inability: Not on file  . Transportation needs:    Medical: Not on file    Non-medical: Not on file  Tobacco Use  . Smoking status: Former Games developer  . Smokeless tobacco: Never Used  Substance and Sexual Activity  . Alcohol use: No  . Drug use: No  . Sexual activity: Never  Lifestyle  . Physical activity:    Days per week: Not on file    Minutes per session: Not on file  . Stress: Not on file  Relationships  . Social connections:    Talks on phone: Not on file    Gets together: Not on file    Attends religious service: Not on file    Active member of club or organization: Not on file    Attends meetings of clubs or organizations: Not on file    Relationship status: Not on file  Other Topics Concern  . Not on file  Social History Narrative  . Not on file   Family History  Problem Relation Age of Onset  . Hypertension Mother   . Arthritis Mother   . Stroke Mother   . Heart attack Father   . Stroke Brother   . Prostate cancer Brother   . Heart disease Brother   . Kidney failure Brother   . Heart disease Brother   . Arthritis Brother   .  Edema Brother    Scheduled Meds: . calcium carbonate  1,000 mg Oral Daily  . cholecalciferol  1,000 Units Oral Daily  . heparin  5,000 Units Subcutaneous Q8H  . levothyroxine  150 mcg Oral QAC breakfast  . metolazone  5 mg Oral Once  . multivitamin  2 tablet Oral Daily  . polyvinyl alcohol  1 drop Both Eyes QHS  . sodium chloride flush  3 mL Intravenous Q12H   Continuous  Infusions: . sodium chloride    . furosemide Stopped (11/07/17 1210)   PRN Meds:.sodium chloride, acetaminophen, meclizine, ondansetron (ZOFRAN) IV, sodium chloride flush Medications Prior to Admission:  Prior to Admission medications   Medication Sig Start Date End Date Taking? Authorizing Provider  calcium carbonate (TUMS EX) 750 MG chewable tablet Chew 2 tablets by mouth daily.   Yes [provider]  cholecalciferol (VITAMIN D) 1000 UNITS tablet Take 1,000 Units by mouth daily.   Yes [provider]  levothyroxine (SYNTHROID, LEVOTHROID) 150 MCG tablet Take 1 tablet (150 mcg total) by mouth daily before breakfast. 07/30/16  Yes Laverda Pageoberts, Lindsay B, NP  Lutein 20 MG TABS Take 20 mg by mouth daily.    Yes [provider]  meclizine (ANTIVERT) 25 MG tablet Take 25 mg by mouth daily as needed for dizziness (or vertigo).    Yes [provider]  Multiple Vitamins-Minerals (PRESERVISION AREDS 2) CAPS Take 2 tablets by mouth daily.   Yes [provider]  Polyvinyl Alcohol-Povidone (REFRESH OP) Place 1 drop into both eyes at bedtime.   Yes [provider]  Potassium 99 MG TABS Take 198 mg by mouth daily. 2 tablets in the AM and 1 tablet in the evening   Yes [provider]  torsemide (DEMADEX) 20 MG tablet Take 4 tablets (80 mg total) by mouth 2 (two) times daily. 08/13/17  Yes Vassie LollMadera, Carlos, MD   Allergies  Allergen Reactions  . Labetalol Swelling    Blisters around lips Swollen lips & eyes, blistering lips, dry mouth  . Inspra [Eplerenone] Other (See  Comments)    Body Rash;  itching of the face  . Latex Itching  . Moxifloxacin Other (See Comments)    Doesn't remember  . Prednisone Nausea And Vomiting    Previously listed as SHOB, but verified with patient/daughter 08/10/17, [facility]  . Pseudoephedrine Hcl Other (See Comments)  . Codeine Sulfate Other (See Comments)    REACTION: "out of body experience"--"floats"  . Diphenhydramine Hcl Other (See Comments)    REACTION: headache, vertigo  . Doxycycline Hyclate Other (See Comments)    REACTION: nausea, vomiting  . Hibiclens [Chlorhexidine] Rash    wipes   Review of Systems Positive for shortness of breath Positive for edema Posterior for wheezing at home  Physical Exam Frail elderly lady resting in bed Left upper extremity swollen and more edematous S1-S2 has a harsh systolic murmur Bilateral breath sounds few scattered rales Awake alert oriented answers questions appropriately Also has bilateral at least 3+ lower extremity edema  Vital Signs: BP 127/71 (BP Location: Right Arm)   Pulse 67   Temp 97.8 F (36.6 C) (Oral)   Resp 18   Ht 5' 2.5" (1.588 m)   Wt 69.1 kg (152 lb 5.4 oz)   SpO2 97%   BMI 27.42 kg/m  Pain Scale: 0-10   Pain Score: 0-No pain   SpO2: SpO2: 97 % O2 Device:SpO2: 97 % O2 Flow Rate: .   IO: Intake/output summary:   Intake/Output Summary (Last 24 hours) at 11/07/2017 1408 Last data filed at 11/07/2017 0800 Gross per 24 hour  Intake 300 ml  Output 200 ml  Net 100 ml    LBM: Last BM Date: 11/07/17 Baseline Weight: Weight: 69.1 kg (152 lb 5.4 oz) Most recent weight: Weight: 69.1 kg (152 lb 5.4 oz)     Palliative Assessment/Data:   PPS 30%  Time In:  10 Time Out:  11.10 Time Total:  70 min  Greater than 50%  of this time was spent counseling and coordinating care related to the above assessment and plan.  Signed by: Rosalin Hawking, MD  [phone]  Please contact Palliative Medicine Team phone at  [phone] for questions and concerns.  For individual provider: See Loretha Stapler

## 2017-11-07 NOTE — Plan of Care (Signed)
  Problem: Pain Managment: Goal: General experience of comfort will improve Outcome: Progressing   Problem: Safety: Goal: Ability to remain free from injury will improve Outcome: Progressing   Problem: Skin Integrity: Goal: Risk for impaired skin integrity will decrease Outcome: Progressing   Problem: Education: Goal: Ability to demonstrate management of disease process will improve Outcome: Progressing Goal: Ability to verbalize understanding of medication therapies will improve Outcome: Progressing   Problem: Activity: Goal: Capacity to carry out activities will improve Outcome: Progressing   Problem: Cardiac: Goal: Ability to achieve and maintain adequate cardiopulmonary perfusion will improve Outcome: Progressing   

## 2017-11-07 NOTE — Progress Notes (Signed)
PT Cancellation Note  Patient Details Name: Kara Cruz MRN: [id number] DOB: 09/28/1917   Cancelled Treatment:    Reason Eval/Treat Not Completed: Fatigue/lethargy limiting ability to participate. Pt sleeping and nurse requested to allow pt to sleep and check back on her tomorrow. Nurse informed family member who was in agreement.  Will check back as schedule permits over the weekend.   Enzo MontgomeryKaren L Mavin Dyke 11/07/2017, 3:27 PM

## 2017-11-08 LAB — MAGNESIUM: MAGNESIUM: 2.9 mg/dL — AB (ref 1.7–2.4)

## 2017-11-08 LAB — BASIC METABOLIC PANEL
Anion gap: 13 (ref 5–15)
BUN: 93 mg/dL — ABNORMAL HIGH (ref 6–20)
CHLORIDE: 100 mmol/L — AB (ref 101–111)
CO2: 22 mmol/L (ref 22–32)
CREATININE: 2.61 mg/dL — AB (ref 0.44–1.00)
Calcium: 8.7 mg/dL — ABNORMAL LOW (ref 8.9–10.3)
GFR calc non Af Amer: 14 mL/min — ABNORMAL LOW (ref >60)
GFR, EST AFRICAN AMERICAN: 17 mL/min — AB (ref >60)
Glucose, Bld: 85 mg/dL (ref 65–99)
POTASSIUM: 3.9 mmol/L (ref 3.5–5.1)
SODIUM: 135 mmol/L (ref 135–145)

## 2017-11-08 MED ORDER — METOLAZONE 5 MG PO TABS
5.0000 mg | ORAL_TABLET | Freq: Once | ORAL | Status: AC
  Administered 2017-11-08: 5 mg via ORAL
  Filled 2017-11-08: qty 1

## 2017-11-08 NOTE — Evaluation (Signed)
Physical Therapy Evaluation Patient Details Name: Kara Cruz MRN: [id number] DOB: [birth year] Today's Date: 11/08/2017   History of Present Illness  Kara Cruz is a 84 year old female with medical history of combined CHF, severe aortic stenosis, CKD stage III, hypothyroidism, PAF with pacemaker presented to the emergency department complaining of shortness of breath, lower extremity swelling and weight gain.  Upon ED evaluation BNP noted to be above 3000, chest x-ray consistent with fluid overload.  Patient was admitted with working diagnosis of CHF exacerbation  Clinical Impression  Pt with some weakness and as noted by daughter, however doing fairly well considering her medical issues. Very motivated and independent , will continue to follow while here, and may need a HHPT to continue with stair training for home safety.     Follow Up Recommendations Home health PT(liked Genevieve Norlander and needs assist with step safety and home safety if family agrees )    Equipment Recommendations  None recommended by PT    Recommendations for Other Services       Precautions / Restrictions Precautions Precautions: None Restrictions Weight Bearing Restrictions: No      Mobility  Bed Mobility Overal bed mobility: Modified Independent             General bed mobility comments: used rail and HOB eleated but only supervision   Transfers Overall transfer level: Needs assistance Equipment used: Rolling walker (2 wheeled) Transfers: Sit to/from Stand Sit to Stand: Supervision         General transfer comment: pt got on and off toilet , wiped in standing with just stand by assist   Ambulation/Gait Ambulation/Gait assistance: Min guard Ambulation Distance (Feet): 80 Feet Assistive device: Rolling walker (2 wheeled) Gait Pattern/deviations: Step-through pattern     General Gait Details: moderate pace , tolerated well   Stairs            Wheelchair Mobility    Modified Rankin  (Stroke Patients Only)       Balance                                             Pertinent Vitals/Pain Pain Assessment: No/denies pain    Home Living Family/patient expects to be discharged to:: Private residence Living Arrangements: Children(dtr) Available Help at Discharge: Family;Available 24 hours/day Type of Home: House Home Access: Stairs to enter Entrance Stairs-Rails: Right Entrance Stairs-Number of Steps: 3-4 Home Layout: One level Home Equipment: Walker - 2 wheels;Shower seat;Wheelchair - manual;Bedside commode;Cane - single point Additional Comments: wheelchair is too heavy to use outside the home, dtr cannot lift it. pt's ability to go up steps to enter the house is getting harder.     Prior Function Level of Independence: Independent with assistive device(s)         Comments: walks independently with RW, daughter supervises bathing, pt sits in shower to bathe;      Hand Dominance        Extremity/Trunk Assessment        Lower Extremity Assessment Lower Extremity Assessment: Overall WFL for tasks assessed(just noticed some SOB with small activity )       Communication   Communication: No difficulties  Cognition Arousal/Alertness: Awake/alert Behavior During Therapy: WFL for tasks assessed/performed Overall Cognitive Status: Within Functional Limits for tasks assessed  General Comments      Exercises     Assessment/Plan    PT Assessment Patient needs continued PT services  PT Problem List Decreased strength;Decreased activity tolerance;Decreased mobility       PT Treatment Interventions Gait training;Stair training;Functional mobility training;Therapeutic activities;Therapeutic exercise;Patient/family education    PT Goals (Current goals can be found in the Care Plan section)  Acute Rehab PT Goals Patient Stated Goal: Pt wishes to stay strong, ambulate , and as  independent as possible  PT Goal Formulation: With patient/family Time For Goal Achievement: 11/22/17 Potential to Achieve Goals: Good    Frequency Min 3X/week   Barriers to discharge        Co-evaluation               AM-PAC PT "6 Clicks" Daily Activity  Outcome Measure Difficulty turning over in bed (including adjusting bedclothes, sheets and blankets)?: A Little Difficulty moving from lying on back to sitting on the side of the bed? : A Little Difficulty sitting down on and standing up from a chair with arms (e.g., wheelchair, bedside commode, etc,.)?: A Little Help needed moving to and from a bed to chair (including a wheelchair)?: A Little Help needed walking in hospital room?: A Little Help needed climbing 3-5 steps with a railing? : A Lot 6 Click Score: 17    End of Session Equipment Utilized During Treatment: Gait belt Activity Tolerance: Patient tolerated treatment well Patient left: in bed;with call bell/phone within reach;with bed alarm set;with family/visitor present Nurse Communication: Mobility status PT Visit Diagnosis: Other abnormalities of gait and mobility (R26.89)    Time: 1530-1616 PT Time Calculation (min) (ACUTE ONLY): 46 min   Charges:   PT Evaluation $PT Eval Low Complexity: 1 Low PT Treatments $Gait Training: 8-22 mins $Therapeutic Activity: 8-22 mins   PT G Codes:        Marella BileSharron Zairah Arista, PT Pager: [phone] 11/08/2017   Lissie Hinesley, Clois DupesSHARRON 11/08/2017, 4:52 PM

## 2017-11-08 NOTE — Progress Notes (Signed)
PROGRESS NOTE [facility]   Kara Cruz   UEA:[id number] DOB: May 04, 1918  DOA: 11/06/2017 PCP: Jarome Matin, MD   Brief Narrative:  Kara Cruz is a 84 year old female with medical history of combined CHF, severe aortic stenosis, CKD stage III, hypothyroidism, PAF with pacemaker presented to the emergency department complaining of shortness of breath, lower extremity swelling and weight gain.  Upon ED evaluation BNP noted to be above 3000, chest x-ray consistent with fluid overload.  Patient was admitted with working diagnosis of CHF exacerbation  Subjective: Patient seen and examined, report her breathing has significantly improved.  Her arm and legs continue to be swollen.  Urine output was okay overnight.  Assessment & Plan: Acute on chronic combined systolic and diastolic CHF Patient with severe aortic stenosis, per cardiology disease terminal and not much else to do. Last echocardiogram EF of 40-45% with severe aortic stenosis, BNP > 3K, chest x-ray with increase in vascular congestion and pulmonary edema.  Palliative care has discussed prognosis with family and patient, for now they would like to continue current treatment.  No comfort measures at this point.  Continue aggressive diuresis for now, Lasix 120 mg 3 times daily we will add another dose of Zaraxolyn.  Cr increase today, I suspect because of poor UOP last night. If trends continues patient may not responding to therapy as expected and likely will need more hospice approach.  I have discussed this with patient and family will continue with the management for another 24-48 hours and see how she responds.  Elevate legs.  Daily weights.  Strict I and O's.  AKI on CKD stage III Suspect cardiorenal syndrome, creatinine slight increase today, if this continues to trend up patient will likely need hospice as diuresis therapy will put her on severe renal failure.  Will give 24-48 hours to see if she responded to  diuresis.  Severe aortic stenosis Per cardiology nothing to offer at this time not candidate for surgical management  Left upper extremity edema ?  Lymphedema due to placement of pacemaker Last admission with similar event Doppler ultrasound was done which I reviewed and no signs of DVT.  Elevate arm.  PAF Rate control with pacemaker Not candidate for anticoagulation  DVT prophylaxis: Heparin subcu Code Status: DNR Family Communication: Daughter at bedside Disposition Plan: Pending clinical course might need hospice if no improvement  Consultants:   Cardiology  Procedures:   None  Antimicrobials:  None   Objective: Vitals:   11/07/17 1308 11/07/17 2035 11/08/17 0508 11/08/17 1441  BP: 127/71 128/63 (!) 118/48 (!) 112/57  Pulse: 67 74 66 60  Resp: 18 20 20 20   Temp: 97.8 F (36.6 C) 97.6 F (36.4 C) 97.7 F (36.5 C) (!) 97.5 F (36.4 C)  TempSrc: Oral Oral Oral Axillary  SpO2: 97% 95% 93% 96%  Weight:   67.1 kg (147 lb 14.9 oz)   Height:        Intake/Output Summary (Last 24 hours) at 11/08/2017 1521 Last data filed at 11/08/2017 1500 Gross per 24 hour  Intake 1026 ml  Output 1975 ml  Net -949 ml   Filed Weights   11/06/17 2301 11/07/17 0612 11/08/17 0508  Weight: 69.1 kg (152 lb 5.4 oz) 69.1 kg (152 lb 5.4 oz) 67.1 kg (147 lb 14.9 oz)    Examination:  General exam: Appears calm and comfortable  HEENT: OP moist and clear Respiratory system: Decreased breath sounds bilaterally, bibasilar crackles Cardiovascular system: S1 & S2 heard, RRR. +  Systolic murmur Gastrointestinal system: Abdomen is nondistended, soft and nontender.  Central nervous system: Alert and oriented. No focal neurological deficits. Extremities: Bilateral lower extremity edema > plus, left upper extremity edema Skin: No rashes Psychiatry:  Mood & affect appropriate.    Data Reviewed: I have personally reviewed following labs and imaging studies  CBC: Recent Labs  Lab  11/06/17 2003  WBC 5.2  NEUTROABS 3.5  HGB 12.2  HCT 37.6  MCV 87.2  PLT 214   Basic Metabolic Panel: Recent Labs  Lab 11/06/17 2003 11/07/17 0514 11/08/17 0517  NA 135 134* 135  K 4.5 4.0 3.9  CL 99* 101 100*  CO2 22 22 22   GLUCOSE 97 97 85  BUN 86* 84* 93*  CREATININE 2.36* 2.35* 2.61*  CALCIUM 8.9 8.8* 8.7*  MG  --   --  2.9*   GFR: Estimated Creatinine Clearance: 11.2 mL/min (A) (by C-G formula based on SCr of 2.61 mg/dL (H)). Liver Function Tests: Recent Labs  Lab 11/06/17 2003  AST 29  ALT 11*  ALKPHOS 126  BILITOT 0.9  PROT 7.2  ALBUMIN 3.4*   No results for input(s): LIPASE, AMYLASE in the last 168 hours. No results for input(s): AMMONIA in the last 168 hours. Coagulation Profile: No results for input(s): INR, PROTIME in the last 168 hours. Cardiac Enzymes: Recent Labs  Lab 11/06/17 2003  TROPONINI 0.09*   BNP (last 3 results) No results for input(s): PROBNP in the last 8760 hours. HbA1C: No results for input(s): HGBA1C in the last 72 hours. CBG: No results for input(s): GLUCAP in the last 168 hours. Lipid Profile: No results for input(s): CHOL, HDL, LDLCALC, TRIG, CHOLHDL, LDLDIRECT in the last 72 hours. Thyroid Function Tests: No results for input(s): TSH, T4TOTAL, FREET4, T3FREE, THYROIDAB in the last 72 hours. Anemia Panel: No results for input(s): VITAMINB12, FOLATE, FERRITIN, TIBC, IRON, RETICCTPCT in the last 72 hours. Sepsis Labs: Recent Labs  Lab 11/06/17 2057  LATICACIDVEN 2.10*    No results found for this or any previous visit (from the past 240 hour(s)).    Radiology Studies: Dg Chest 2 View  Result Date: 11/06/2017 CLINICAL DATA:  Shortness of breath EXAM: CHEST - 2 VIEW COMPARISON:  08/10/2017, 08/06/2017, 09/16/2014 FINDINGS: Small pleural effusions. Cardiomegaly with vascular congestion. Aortic atherosclerosis. Left-sided pacing device with similar lead position. No pneumothorax. IMPRESSION: Cardiomegaly with vascular  congestion and small pleural effusions. Electronically Signed   By: Jasmine PangKim  Fujinaga M.D.   On: 11/06/2017 20:21    Scheduled Meds: . calcium carbonate  1,000 mg Oral Daily  . cholecalciferol  1,000 Units Oral Daily  . heparin  5,000 Units Subcutaneous Q8H  . levothyroxine  150 mcg Oral QAC breakfast  . multivitamin  2 tablet Oral Daily  . polyvinyl alcohol  1 drop Both Eyes QHS  . sodium chloride flush  3 mL Intravenous Q12H   Continuous Infusions: . sodium chloride    . furosemide Stopped (11/08/17 1221)     LOS: 2 days    Time spent: Total of 35 minutes spent with pt, greater than 50% of which was spent in discussion of  treatment, counseling and coordination of care   Latrelle DodrillEdwin Silva, MD Pager: Text Page via [email or web address]   If 7PM-7AM, please contact night-coverage [email or web address] 11/08/2017, 3:21 PM   Note - This record has been created using [employer] software. Chart creation errors have been sought, but may not always have been located. Such creation errors do not reflect on the standard  of medical care.

## 2017-11-09 LAB — BASIC METABOLIC PANEL
Anion gap: 12 (ref 5–15)
BUN: 100 mg/dL — ABNORMAL HIGH (ref 6–20)
CALCIUM: 8.7 mg/dL — AB (ref 8.9–10.3)
CO2: 23 mmol/L (ref 22–32)
CREATININE: 2.67 mg/dL — AB (ref 0.44–1.00)
Chloride: 99 mmol/L — ABNORMAL LOW (ref 101–111)
GFR calc non Af Amer: 14 mL/min — ABNORMAL LOW (ref >60)
GFR, EST AFRICAN AMERICAN: 16 mL/min — AB (ref >60)
Glucose, Bld: 90 mg/dL (ref 65–99)
Potassium: 3.3 mmol/L — ABNORMAL LOW (ref 3.5–5.1)
Sodium: 134 mmol/L — ABNORMAL LOW (ref 135–145)

## 2017-11-09 LAB — MAGNESIUM: Magnesium: 2.8 mg/dL — ABNORMAL HIGH (ref 1.7–2.4)

## 2017-11-09 MED ORDER — POTASSIUM CHLORIDE 10 MEQ/100ML IV SOLN
10.0000 meq | INTRAVENOUS | Status: AC
  Administered 2017-11-09 (×2): 10 meq via INTRAVENOUS
  Filled 2017-11-09 (×2): qty 100

## 2017-11-09 MED ORDER — POTASSIUM CHLORIDE CRYS ER 20 MEQ PO TBCR
40.0000 meq | EXTENDED_RELEASE_TABLET | ORAL | Status: DC
  Administered 2017-11-09: 40 meq via ORAL
  Filled 2017-11-09: qty 2

## 2017-11-09 MED ORDER — ALBUTEROL SULFATE (2.5 MG/3ML) 0.083% IN NEBU
2.5000 mg | INHALATION_SOLUTION | RESPIRATORY_TRACT | Status: DC | PRN
  Administered 2017-11-10 – 2017-11-13 (×2): 2.5 mg via RESPIRATORY_TRACT
  Filled 2017-11-09 (×2): qty 3

## 2017-11-09 NOTE — Progress Notes (Signed)
Daily Progress Note   Patient Name: Kara Cruz       Date: 11/09/2017 DOB: 09/05/1917  Age: 84 y.o. MRN#: [id number] Attending Physician: Randel PiggSilva Zapata, Dorma RussellEdwin, MD Primary Care Physician: Jarome MatinPaterson, Daniel, MD Admit Date: 11/06/2017  Reason for Consultation/Follow-up: Establishing goals of care  Subjective: Patient appears weak She is resting in bed She still has LE edema Denies chest pain, no dyspnea at rest   Discussed with daughter at bedside See below:   Length of Stay: 3  Current Medications: Scheduled Meds:  . calcium carbonate  1,000 mg Oral Daily  . cholecalciferol  1,000 Units Oral Daily  . heparin  5,000 Units Subcutaneous Q8H  . levothyroxine  150 mcg Oral QAC breakfast  . multivitamin  2 tablet Oral Daily  . polyvinyl alcohol  1 drop Both Eyes QHS  . potassium chloride  40 mEq Oral Q4H  . sodium chloride flush  3 mL Intravenous Q12H    Continuous Infusions: . sodium chloride    . furosemide Stopped (11/08/17 2253)    PRN Meds: sodium chloride, acetaminophen, meclizine, ondansetron (ZOFRAN) IV, sodium chloride flush  Physical Exam          General exam: Appears weak, chronically ill but not in acute distress.   Respiratory system: Decreased breath sounds bilaterally,  Cardiovascular system: S1 & S2 heard, RRR. + Systolic murmur Gastrointestinal system: Abdomen is nondistended, soft and nontender.  Resting comfortably Has bilateral LE edema Also has LUE edema   Vital Signs: BP 115/65 (BP Location: Right Arm)   Pulse 63   Temp 97.7 F (36.5 C) (Oral)   Resp 20   Ht 5' 2.5" (1.588 m)   Wt 68.1 kg (150 lb 2.1 oz)   SpO2 97%   BMI 27.02 kg/m  SpO2: SpO2: 97 % O2 Device: O2 Device: Room Air O2 Flow Rate:    Intake/output summary:   Intake/Output  Summary (Last 24 hours) at 11/09/2017 1010 Last data filed at 11/09/2017 0849 Gross per 24 hour  Intake 1025 ml  Output 3350 ml  Net -2325 ml   LBM: Last BM Date: 11/08/17 Baseline Weight: Weight: 69.1 kg (152 lb 5.4 oz) Most recent weight: Weight: 68.1 kg (150 lb 2.1 oz)       Palliative Assessment/Data: PPS 30%     Patient Active  Problem List   Diagnosis Date Noted  . Acute on chronic congestive heart failure (HCC)   . Acute on chronic combined systolic and diastolic CHF (congestive heart failure) (HCC) 11/06/2017  . Hypoxia   . SOB (shortness of breath)   . Acute renal failure superimposed on stage 3 chronic kidney disease (HCC)   . Edema 08/06/2017  . ARF (acute renal failure) (HCC) 08/06/2017  . AKI (acute kidney injury) (HCC)   . Peripheral edema   . Acute on chronic combined systolic and diastolic HF (heart failure) (HCC) 07/23/2016  . AMD (age-related macular degeneration), wet (HCC) 10/06/2014  . Retinal macular atrophy 10/06/2014  . History of surgical procedure 10/06/2014  . Complete heart block (HCC) 09/14/2014  . Nonrheumatic aortic valve stenosis 09/14/2014  . Degeneration macular 07/16/2011  . GOUT 06/28/2008  . ACTINIC KERATOSIS 06/26/2007  . Hypothyroidism 03/19/2007  . Essential hypertension 03/19/2007  . RIGHT BUNDLE BRANCH BLOCK 03/19/2007  . OSTEOPOROSIS 03/19/2007    Palliative Care Assessment & Plan   Patient Profile:    Assessment:  acute on chronic combined systolic and diastolic CHF Severe aortic stenosis Worsening renal function AKI on stage III CKD LUE edema Generalized deconditioning and weakness.   Recommendations/Plan:   Discussed with patient's daughter by the bedside this am: the patient is eating well, is participating in PT, she doesn't have symptoms such as chest pain/dyspnea.   We discussed about the fact that the patient's LUE edema continues and generalized edema is also present, serum creatinine today is slightly worse.  Patient's daughter states that she is aware that the patient's overall condition and serious illnesses are incurable, additionally it is of concern that the patient's serum creatinine continues to rise, discussed with her that judicious diuretic use is being done, but that we will need to monitor disease trajectory in this hospitalization to help guide further discussions.   Patient's daughter states that she believes the patient still has at least 1 more year to live, that only God knows how much time someone has or doesn't have. I have offered active listening and supportive care. How ever, at this time, daughter doesn't wish to discuss further about hospice.   PMT will follow along periodically.   Code Status:    Code Status Orders  (From admission, onward)        Start     Ordered   11/06/17 2143  Do not attempt resuscitation (DNR)  Continuous    Question Answer Comment  In the event of cardiac or respiratory ARREST Do not call a "code blue"   In the event of cardiac or respiratory ARREST Do not perform Intubation, CPR, defibrillation or ACLS   In the event of cardiac or respiratory ARREST Use medication by any route, position, wound care, and other measures to relive pain and suffering. May use oxygen, suction and manual treatment of airway obstruction as needed for comfort.      11/06/17 2145    Code Status History    Date Active Date Inactive Code Status Order ID Comments User Context   08/06/2017 2138 08/13/2017 2102 DNR [id number]  Pearson Grippe, MD Inpatient   07/23/2016 1804 07/30/2016 1717 DNR [id number]  Little Ishikawa, NP Inpatient   07/23/2016 1710 07/23/2016 1804 Full Code [id number]  Allayne Butcher, PA-C Inpatient   09/14/2014 1805 09/16/2014 1759 Full Code [id number]  Duke Salvia, MD Inpatient    Advance Directive Documentation     Most Recent Value  Type of Advance  Directive  Living will  Pre-existing out of facility DNR order (yellow form or pink MOST form)  -    "MOST" Form in Place?  -       Prognosis:   guarded   Discharge Planning:  Home with Home Health although we will have to monitor disease trajectory for need for hospice   Care plan was discussed with   Patient's daughter.   Thank you for allowing the Palliative Medicine Team to assist in the care of this patient.   Time In: 9.30 Time Out: 10.05 Total Time 35 Prolonged Time Billed  no       Greater than 50%  of this time was spent counseling and coordinating care related to the above assessment and plan.  Rosalin Hawking, MD [phone]  Please contact Palliative Medicine Team phone at [phone] for questions and concerns.

## 2017-11-09 NOTE — Progress Notes (Signed)
Pharmacist Heart Failure Core Measure Documentation  Assessment: Kara Cruz has an EF documented as 30-35% on 11/07/17 by echo.  Rationale: Heart failure patients with left ventricular systolic dysfunction (LVSD) and an EF < 40% should be prescribed an angiotensin converting enzyme inhibitor (ACEI) or angiotensin receptor blocker (ARB) at discharge unless a contraindication is documented in the medical record.  This patient is not currently on an ACEI or ARB for HF.  This note is being placed in the record in order to provide documentation that a contraindication to the use of these agents is present for this encounter.  ACE Inhibitor or Angiotensin Receptor Blocker is contraindicated (specify all that apply)  []   ACEI allergy AND ARB allergy []   Angioedema []   Moderate or severe aortic stenosis []   Hyperkalemia []   Hypotension []   Renal artery stenosis [x]   Worsening renal function, preexisting renal disease or dysfunction   Kara Cruz, Kara Cruz 11/09/2017 12:03 PM

## 2017-11-09 NOTE — Progress Notes (Addendum)
PROGRESS NOTE [facility]   AARIN SPARKMAN   ZOX:[id number] DOB: 1918-05-04  DOA: 11/06/2017 PCP: Jarome Matin, MD   Brief Narrative:  Kara Cruz is a 84 year old female with medical history of combined CHF, severe aortic stenosis, CKD stage III, hypothyroidism, PAF with pacemaker presented to the emergency department complaining of shortness of breath, lower extremity swelling and weight gain.  Upon ED evaluation BNP noted to be above 3000, chest x-ray consistent with fluid overload.  Patient was admitted with working diagnosis of CHF exacerbation  Subjective: She is seen and examined, today slight short of breath.  Swelling of left arm and lower extremity improving.  Creatinine about the same as yesterday.  Assessment & Plan: Acute on chronic combined systolic and diastolic CHF Patient with severe aortic stenosis, per cardiology disease terminal and not much else to do. Last echocardiogram EF of 40-45% with severe aortic stenosis, BNP > 3K, chest x-ray with increase in vascular congestion and pulmonary edema.  Palliative care has discussed prognosis with family and patient, for now they would like to continue current treatment.  No comfort measures at this point.   She had an amazing urine output yesterday putting almost 3 L. Continue current diuresis, patient has also received Zaroxolyn x2 will hold that today.  Creatinine ?  Reach peak point and will start decreased now.  Continue daily weights and strict I&O's.  Elevate legs.  AKI on CKD stage III Suspect cardiorenal syndrome, creatinine ?  Reach peak point and will start decreased now.  Continue to monitor closely as patient receiving high dose of Lasix.  Severe aortic stenosis Per cardiology nothing to offer at this time not candidate for surgical management  Left upper extremity edema ?  Lymphedema due to placement of pacemaker Last admission with similar event Doppler ultrasound was done which I reviewed and no  signs of DVT.  Elevate arm.  PAF Rate control with pacemaker Not candidate for anticoagulation  Hypokalemia Due to aggressive diuresis Replete Check BMP in a.m.  DVT prophylaxis: Heparin subcu Code Status: DNR Family Communication: Daughter at bedside Disposition Plan: Pending clinical course might need hospice if no improvement  Consultants:   Cardiology  Procedures:   None  Antimicrobials:  None   Objective: Vitals:   11/08/17 1441 11/08/17 2119 11/09/17 0525 11/09/17 1349  BP: (!) 112/57 113/60 115/65 115/73  Pulse: 60 63 63 61  Resp: 20 20 20 20   Temp: (!) 97.5 F (36.4 C) 98 F (36.7 C) 97.7 F (36.5 C) 98.2 F (36.8 C)  TempSrc: Axillary Oral Oral Oral  SpO2: 96% 98% 97% 98%  Weight:   68.1 kg (150 lb 2.1 oz)   Height:        Intake/Output Summary (Last 24 hours) at 11/09/2017 1451 Last data filed at 11/09/2017 1356 Gross per 24 hour  Intake 1025 ml  Output 3350 ml  Net -2325 ml   Filed Weights   11/07/17 0612 11/08/17 0508 11/09/17 0525  Weight: 69.1 kg (152 lb 5.4 oz) 67.1 kg (147 lb 14.9 oz) 68.1 kg (150 lb 2.1 oz)    Examination:  General: Mild distress Cardiovascular: Regular rhythm S1/S2 + systolic murmur Respiratory: Decreased breath sounds bilaterally, bibasilar crackles Abdominal: Soft, NT, ND Extremities: Left upper extremity edema significantly improved, right lower extremity edema improving  Data Reviewed: I have personally reviewed following labs and imaging studies  CBC: Recent Labs  Lab 11/06/17 2003  WBC 5.2  NEUTROABS 3.5  HGB 12.2  HCT 37.6  MCV 87.2  PLT 214   Basic Metabolic Panel: Recent Labs  Lab 11/06/17 2003 11/07/17 0514 11/08/17 0517 11/09/17 0519  NA 135 134* 135 134*  K 4.5 4.0 3.9 3.3*  CL 99* 101 100* 99*  CO2 22 22 22 23   GLUCOSE 97 97 85 90  BUN 86* 84* 93* 100*  CREATININE 2.36* 2.35* 2.61* 2.67*  CALCIUM 8.9 8.8* 8.7* 8.7*  MG  --   --  2.9* 2.8*   GFR: Estimated Creatinine Clearance:  11 mL/min (A) (by C-G formula based on SCr of 2.67 mg/dL (H)). Liver Function Tests: Recent Labs  Lab 11/06/17 2003  AST 29  ALT 11*  ALKPHOS 126  BILITOT 0.9  PROT 7.2  ALBUMIN 3.4*   No results for input(s): LIPASE, AMYLASE in the last 168 hours. No results for input(s): AMMONIA in the last 168 hours. Coagulation Profile: No results for input(s): INR, PROTIME in the last 168 hours. Cardiac Enzymes: Recent Labs  Lab 11/06/17 2003  TROPONINI 0.09*   BNP (last 3 results) No results for input(s): PROBNP in the last 8760 hours. HbA1C: No results for input(s): HGBA1C in the last 72 hours. CBG: No results for input(s): GLUCAP in the last 168 hours. Lipid Profile: No results for input(s): CHOL, HDL, LDLCALC, TRIG, CHOLHDL, LDLDIRECT in the last 72 hours. Thyroid Function Tests: No results for input(s): TSH, T4TOTAL, FREET4, T3FREE, THYROIDAB in the last 72 hours. Anemia Panel: No results for input(s): VITAMINB12, FOLATE, FERRITIN, TIBC, IRON, RETICCTPCT in the last 72 hours. Sepsis Labs: Recent Labs  Lab 11/06/17 2057  LATICACIDVEN 2.10*    No results found for this or any previous visit (from the past 240 hour(s)).    Radiology Studies: No results found.  Scheduled Meds: . calcium carbonate  1,000 mg Oral Daily  . cholecalciferol  1,000 Units Oral Daily  . heparin  5,000 Units Subcutaneous Q8H  . levothyroxine  150 mcg Oral QAC breakfast  . multivitamin  2 tablet Oral Daily  . polyvinyl alcohol  1 drop Both Eyes QHS  . sodium chloride flush  3 mL Intravenous Q12H   Continuous Infusions: . sodium chloride    . furosemide 120 mg (11/09/17 1100)  . potassium chloride 10 mEq (11/09/17 1356)     LOS: 3 days   Time spent: Total of 35 minutes spent with pt, greater than 50% of which was spent in discussion of  treatment, counseling and coordination of care   Latrelle DodrillEdwin Silva, MD Pager: Text Page via [email or web address]   If 7PM-7AM, please contact  night-coverage [email or web address] 11/09/2017, 2:51 PM   Note - This record has been created using [employer] software. Chart creation errors have been sought, but may not always have been located. Such creation errors do not reflect on the standard of medical care.

## 2017-11-09 NOTE — Progress Notes (Signed)
Patient's pacemaker was not sensing and seemed to firing off in the middle of QRS waves per CCMD. Pt denies cheat pain. EKG obtained and placed in chart. On call provider notified. No new orders were given. Will continue to monitor.

## 2017-11-10 LAB — BASIC METABOLIC PANEL
Anion gap: 15 (ref 5–15)
BUN: 106 mg/dL — ABNORMAL HIGH (ref 6–20)
CHLORIDE: 99 mmol/L — AB (ref 101–111)
CO2: 23 mmol/L (ref 22–32)
Calcium: 8.8 mg/dL — ABNORMAL LOW (ref 8.9–10.3)
Creatinine, Ser: 2.58 mg/dL — ABNORMAL HIGH (ref 0.44–1.00)
GFR calc Af Amer: 17 mL/min — ABNORMAL LOW (ref >60)
GFR calc non Af Amer: 15 mL/min — ABNORMAL LOW (ref >60)
GLUCOSE: 91 mg/dL (ref 65–99)
POTASSIUM: 3.4 mmol/L — AB (ref 3.5–5.1)
Sodium: 137 mmol/L (ref 135–145)

## 2017-11-10 LAB — MAGNESIUM: Magnesium: 2.8 mg/dL — ABNORMAL HIGH (ref 1.7–2.4)

## 2017-11-10 MED ORDER — POTASSIUM CHLORIDE 10 MEQ/100ML IV SOLN
10.0000 meq | INTRAVENOUS | Status: AC
  Administered 2017-11-10 (×2): 10 meq via INTRAVENOUS
  Filled 2017-11-10 (×2): qty 100

## 2017-11-10 NOTE — Progress Notes (Signed)
Physical Therapy Treatment Patient Details Name: Kara Cruz MRN: [id number] DOB: [birth year] Today's Date: 11/10/2017    History of Present Illness Kara Cruz is a 84 year old female with medical history of combined CHF, severe aortic stenosis, CKD stage III, hypothyroidism, PAF with pacemaker presented to the emergency department complaining of shortness of breath, lower extremity swelling and weight gain.  Upon ED evaluation BNP noted to be above 3000, chest x-ray consistent with fluid overload.  Patient was admitted with working diagnosis of CHF exacerbation    PT Comments    Patient progressing to stair training this session, but still needing 50% help or more.  Feel she will benefit from continued skilled PT until d/c home with HHPT and family support.   Follow Up Recommendations  Home health PT     Equipment Recommendations  None recommended by PT    Recommendations for Other Services       Precautions / Restrictions Precautions Precautions: Fall    Mobility  Bed Mobility Overal bed mobility: Modified Independent             General bed mobility comments: used rail and HOB eleated but only supervision   Transfers Overall transfer level: Needs assistance Equipment used: Rolling walker (2 wheeled) Transfers: Sit to/from Stand Sit to Stand: Supervision            Ambulation/Gait Ambulation/Gait assistance: Min guard;Supervision Ambulation Distance (Feet): 80 Feet Assistive device: Rolling walker (2 wheeled) Gait Pattern/deviations: Step-to pattern;Step-through pattern;Decreased stride length;Trunk flexed     General Gait Details: assist for safety, slow pace, increased proximity to walker at times, cues and A for safety, HR 71, SpO2 92% on RA   Stairs Stairs: Yes   Stair Management: Step to pattern;Forwards;Backwards;Two rails Number of Stairs: 2 General stair comments: initially educated to attempt side stepping, but pt reported was unable,  assist to lift up step as pt stated unable to step up , cues to turn around to step down forward, but pt preferred backwards with mod A for safety  Wheelchair Mobility    Modified Rankin (Stroke Patients Only)       Balance Overall balance assessment: Needs assistance   Sitting balance-Leahy Scale: Good       Standing balance-Leahy Scale: Fair                              Cognition Arousal/Alertness: Awake/alert Behavior During Therapy: WFL for tasks assessed/performed Overall Cognitive Status: Within Functional Limits for tasks assessed                                        Exercises      General Comments General comments (skin integrity, edema, etc.): patient voicing frustration with current medical issues, states should have been gone a long time ago. encouraged her to stay the course.      Pertinent Vitals/Pain Pain Assessment: No/denies pain    Home Living                      Prior Function            PT Goals (current goals can now be found in the care plan section) Progress towards PT goals: Progressing toward goals    Frequency    Min 3X/week      PT Plan Current plan  remains appropriate    Co-evaluation              AM-PAC PT "6 Clicks" Daily Activity  Outcome Measure  Difficulty turning over in bed (including adjusting bedclothes, sheets and blankets)?: A Little Difficulty moving from lying on back to sitting on the side of the bed? : A Little Difficulty sitting down on and standing up from a chair with arms (e.g., wheelchair, bedside commode, etc,.)?: A Little Help needed moving to and from a bed to chair (including a wheelchair)?: A Little Help needed walking in hospital room?: A Little Help needed climbing 3-5 steps with a railing? : A Lot 6 Click Score: 17    End of Session Equipment Utilized During Treatment: Gait belt Activity Tolerance: Patient tolerated treatment well Patient left: in  bed;with call bell/phone within reach;with bed alarm set;with nursing/sitter in room   PT Visit Diagnosis: Other abnormalities of gait and mobility (R26.89)     Time: [id number] PT Time Calculation (min) (ACUTE ONLY): 25 min  Charges:  $Gait Training: 23-37 mins                    G CodesSheran Cruz:       Kara Cruz , Kara CarolinaPT [phone] 11/10/2017    Kara Cruz  11/10/2017, 1:11 PM

## 2017-11-10 NOTE — Progress Notes (Signed)
PROGRESS NOTE    Kara Cruz  ZOX:[id number] DOB: February 10, 1918 DOA: 11/06/2017 PCP: Jarome Matin, MD Outpatient Specialists:  Brief Narrative:  Patient is 84 year old female with history of combined CHF, severe aortic stenosis, CKD stage III, hypothyroidism, PAF with pacemaker who presented to the ED complaining of shortness of breath, left upper extremity swelling, lower extremity swelling and weight gain.  Upon ED evaluation BNP noted to be above 3000, chest x-ray consistent with fluid overload.  Patient was admitted with working diagnosis of CHF exacerbation.   Assessment & Plan:   1. Acute on chronic combined CHF  - At admission, BNP >3000, cardiomegaly with vascular congestion on CXR, BLE edema, history of similar symptoms during admission in January - Echocardiogram 4/5, showed LV EF 30-35%, severe aortic stenosis, moderate mitral regurgitation, significant septal-lateral LV wall dyssynchrony, increased RV systolic pressure consistent with severe pulmonary HTN - According to nurse notes, pt did not receive 2nd or 3rd Lasix dose yesterday, first dose of lasix today started at 6:00, continue TID lasix  2. Edema of LUE, abdomen and BLE - most likely due to acute on chronic combined CHF -similar LUE edema without RUE edema was found at last admission in Jan, but this seems to be worse, 08/07/17 LUE and BLE [place] reviewed with no signs of LUE DVT - LUE edema has significantly improved from admission, almost similar appearance to RUE  3. Severe Aortic stenosis - per cardiology, not a surgical candidate  4. PAF/ AV CHB - rate controlled with pacemaker -CHADSVASc score 4 - Not a candidate for anticoagulation  5. CKD Stage IV/V: - Creatinine 2.58, mildly decreased from last 2 days - continue IV fluids  6. Hypothyroidism - Continue Synthroid  7. Hypokalemia -replete with IV x 2   DVT prophylaxis: SubQ Heparin Code Status: DNR Family Communication: Daughter at  bedside Disposition Plan: Pending clinical course, may need hospice   Consultants:   Cardiology  Procedures:   Echocardiogram 4/5  Antimicrobials:   None   Subjective: Patient was alert and oriented, continues to feel short of breath when laying down, but otherwise no other complaints. She is very happy with how much her extremity swelling has decreased.   Objective: Vitals:   11/09/17 0525 11/09/17 1349 11/09/17 2221 11/10/17 0546  BP: 115/65 115/73 115/62 (!) 113/51  Pulse: 63 61 62 60  Resp: 20 20 20 20   Temp: 97.7 F (36.5 C) 98.2 F (36.8 C) 97.9 F (36.6 C) (!) 97.4 F (36.3 C)  TempSrc: Oral Oral Oral Oral  SpO2: 97% 98% 95% 94%  Weight: 68.1 kg (150 lb 2.1 oz)   66 kg (145 lb 8.1 oz)  Height:        Intake/Output Summary (Last 24 hours) at 11/10/2017 1156 Last data filed at 11/10/2017 0703 Gross per 24 hour  Intake 385 ml  Output 2000 ml  Net -1615 ml   Filed Weights   11/08/17 0508 11/09/17 0525 11/10/17 0546  Weight: 67.1 kg (147 lb 14.9 oz) 68.1 kg (150 lb 2.1 oz) 66 kg (145 lb 8.1 oz)    Examination:  General exam: Appears calm and comfortable  Respiratory system: Clear to auscultation. Respiratory effort normal. Cardiovascular system: S1 & S2 heard, RRR with murmor Gastrointestinal system: Abdomen is nondistended, soft and nontender.  Central nervous system: Alert and oriented. No focal neurological deficits. Extremities: 1+ LUE edema (signifcantly improved), bilateral lower extremity edema (improved) Skin: No rashes, lesions or ulcers Psychiatry: Judgement and insight appear normal. Mood &  affect appropriate.   Data Reviewed: I have personally reviewed following labs and imaging studies  CBC: Recent Labs  Lab 11/06/17 2003  WBC 5.2  NEUTROABS 3.5  HGB 12.2  HCT 37.6  MCV 87.2  PLT 214   Basic Metabolic Panel: Recent Labs  Lab 11/06/17 2003 11/07/17 0514 11/08/17 0517 11/09/17 0519 11/10/17 0449  NA 135 134* 135 134* 137  K  4.5 4.0 3.9 3.3* 3.4*  CL 99* 101 100* 99* 99*  CO2 22 22 22 23 23   GLUCOSE 97 97 85 90 91  BUN 86* 84* 93* 100* 106*  CREATININE 2.36* 2.35* 2.61* 2.67* 2.58*  CALCIUM 8.9 8.8* 8.7* 8.7* 8.8*  MG  --   --  2.9* 2.8* 2.8*   GFR: Estimated Creatinine Clearance: 11.3 mL/min (A) (by C-G formula based on SCr of 2.58 mg/dL (H)). Liver Function Tests: Recent Labs  Lab 11/06/17 2003  AST 29  ALT 11*  ALKPHOS 126  BILITOT 0.9  PROT 7.2  ALBUMIN 3.4*   No results for input(s): LIPASE, AMYLASE in the last 168 hours. No results for input(s): AMMONIA in the last 168 hours. Coagulation Profile: No results for input(s): INR, PROTIME in the last 168 hours. Cardiac Enzymes: Recent Labs  Lab 11/06/17 2003  TROPONINI 0.09*   BNP (last 3 results) No results for input(s): PROBNP in the last 8760 hours. HbA1C: No results for input(s): HGBA1C in the last 72 hours. CBG: No results for input(s): GLUCAP in the last 168 hours. Lipid Profile: No results for input(s): CHOL, HDL, LDLCALC, TRIG, CHOLHDL, LDLDIRECT in the last 72 hours. Thyroid Function Tests: No results for input(s): TSH, T4TOTAL, FREET4, T3FREE, THYROIDAB in the last 72 hours. Anemia Panel: No results for input(s): VITAMINB12, FOLATE, FERRITIN, TIBC, IRON, RETICCTPCT in the last 72 hours. Urine analysis: No results found for: COLORURINE, APPEARANCEUR, LABSPEC, PHURINE, GLUCOSEU, HGBUR, BILIRUBINUR, KETONESUR, PROTEINUR, UROBILINOGEN, NITRITE, LEUKOCYTESUR Sepsis Labs: @LABRCNTIP (procalcitonin:4,lacticidven:4)  )No results found for this or any previous visit (from the past 240 hour(s)).    Radiology Studies: No results found.   Scheduled Meds: . calcium carbonate  1,000 mg Oral Daily  . cholecalciferol  1,000 Units Oral Daily  . heparin  5,000 Units Subcutaneous Q8H  . levothyroxine  150 mcg Oral QAC breakfast  . multivitamin  2 tablet Oral Daily  . polyvinyl alcohol  1 drop Both Eyes QHS  . sodium chloride flush  3  mL Intravenous Q12H   Continuous Infusions: . sodium chloride    . furosemide 120 mg (11/10/17 0547)     LOS: 4 days    Time spent: 25 mins    Janine OresBlaine Consuella Scurlock, PA-S [facility] If 7PM-7AM, please contact night-coverage [email or web address] Password [email or web address] 11/10/2017, 11:56 AM

## 2017-11-10 NOTE — Progress Notes (Signed)
When administering 2200 dose, RN noticed last administration did not go in. Pharmacy notified.

## 2017-11-10 NOTE — Care Management Important Message (Signed)
Important Message  Patient Details  Name: Kara Cruz MRN: [id number] Date of Birth: 05/09/1918   Medicare Important Message Given:  Yes    Caren MacadamFuller, Gitty Osterlund 11/10/2017, 11:20 AMImportant Message  Patient Details  Name: Kara Cruz MRN: [id number] Date of Birth: 02/28/1918   Medicare Important Message Given:  Yes    Caren MacadamFuller, Jashay Roddy 11/10/2017, 11:20 AM

## 2017-11-10 NOTE — Progress Notes (Signed)
2200 dose of lasix did not go in. Pharmacy notified and times changed to give dose at 0600.

## 2017-11-11 DIAGNOSIS — R0603 Acute respiratory distress: Secondary | ICD-10-CM

## 2017-11-11 LAB — BASIC METABOLIC PANEL
ANION GAP: 16 — AB (ref 5–15)
BUN: 116 mg/dL — ABNORMAL HIGH (ref 6–20)
CHLORIDE: 98 mmol/L — AB (ref 101–111)
CO2: 23 mmol/L (ref 22–32)
Calcium: 8.8 mg/dL — ABNORMAL LOW (ref 8.9–10.3)
Creatinine, Ser: 2.83 mg/dL — ABNORMAL HIGH (ref 0.44–1.00)
GFR calc Af Amer: 15 mL/min — ABNORMAL LOW (ref >60)
GFR, EST NON AFRICAN AMERICAN: 13 mL/min — AB (ref >60)
Glucose, Bld: 89 mg/dL (ref 65–99)
POTASSIUM: 3.2 mmol/L — AB (ref 3.5–5.1)
SODIUM: 137 mmol/L (ref 135–145)

## 2017-11-11 LAB — MAGNESIUM: Magnesium: 2.7 mg/dL — ABNORMAL HIGH (ref 1.7–2.4)

## 2017-11-11 MED ORDER — FUROSEMIDE 10 MG/ML IJ SOLN
80.0000 mg | Freq: Two times a day (BID) | INTRAMUSCULAR | Status: DC
  Administered 2017-11-11 – 2017-11-14 (×6): 80 mg via INTRAVENOUS
  Filled 2017-11-11 (×7): qty 8

## 2017-11-11 MED ORDER — FUROSEMIDE 10 MG/ML IJ SOLN: 80.0000 mg | Freq: Two times a day (BID) | INTRAMUSCULAR | Status: DC

## 2017-11-11 MED ORDER — POTASSIUM CHLORIDE 10 MEQ/100ML IV SOLN
10.0000 meq | INTRAVENOUS | Status: AC
  Administered 2017-11-11 (×3): 10 meq via INTRAVENOUS
  Filled 2017-11-11 (×3): qty 100

## 2017-11-11 NOTE — Progress Notes (Signed)
PROGRESS NOTE    Kara Cruz  ZOX:[id number] DOB: 1917-10-17 DOA: 11/06/2017 PCP: Jarome Matin, MD Outpatient Specialists:   Brief Narrative:  Patient is 84 year old female with history of combined CHF, severe aortic stenosis, CKD stage III, hypothyroidism, PAF with pacemaker who presented to the ED complaining of shortness of breath, left upper extremity swelling, bilateral lower extremity swelling and weight gain. Upon ED evaluation BNP noted to be above 3000, chest x-ray consistent with fluid overload. Patient was admitted with working diagnosis of CHF exacerbation.   Assessment & Plan:   1. Acute on chronic combined CHF  - At admission, BNP >3000, cardiomegaly with vascular congestion on CXR, BLE edema, history ofsimilarsymptoms during admission in January - Echocardiogram 4/5, showed LV EF 30-35%, severe aortic stenosis, moderate mitral regurgitation, significant septal-lateral LV wall dyssynchrony, increased RV systolic pressure consistent with severe pulmonary HTN - Cr increase from 2.58 to 2.83 within last day, pt continue to have good urine output,  lasix dose changed to 80 mg BID - continue strict I's and O's, daily weights - palliative care and hospice discussed with patient and daughter, both agreed that hospice would be a good option for patient upon discharge  2. Edema of LUE, abdomen and BLE - due to acute on chronic combined CHF, 08/07/17 LUE and BLE [place] reviewed with no signs of LUE DVT - LUE edema has significantly improved from admission, almost similar appearance to RUE, continue to elevate LUE - continued edema of BLE, continue to elevate legs  3.SevereAortic stenosis - per cardiology,not a surgical candidate  4.PAF/ AV CHB - rate controlled with pacemaker -CHADSVASc score 4 - Not a candidate for anticoagulation  5. AKI on CKD StageIV/V: -Creatinine increase to 2.83 from 2.58 yesterday - continue IV fluids, lasix changed to 80 mg BID  6.  Hypothyroidism - Continue Synthroid  7. Hypokalemia, Hypermagnesemia - replete with K IV x 3 - Mg decreased from 2.9 to 2.7 - Check repeat bmp and magnesium in am  DVT prophylaxis: SubQ Heparin Code Status: DNR Family Communication: Daughter at bedside Disposition Plan: Pending clinical course, may need hospice  Consultants:   Cardiology  Palliative Care  Procedures:   Echocardiogram 4/5  Antimicrobials:   None   Subjective: Patient is alert and oriented. Patient is happy with diuresis and decreased edema, but continues to feel weak. No other complaints. When speaking about hospice/palliative care, patient states "I'm ready to go" and would like to consider these measures.   Objective: Vitals:   11/10/17 1749 11/10/17 2150 11/10/17 2156 11/11/17 0602  BP: 116/61 (!) 108/46  (!) 107/59  Pulse: 62 68  68  Resp: 18 18  18   Temp: 97.7 F (36.5 C) 98.2 F (36.8 C)  97.8 F (36.6 C)  TempSrc: Oral Oral  Oral  SpO2: 95% 95% 96% 96%  Weight:    65.8 kg (145 lb 1 oz)  Height:        Intake/Output Summary (Last 24 hours) at 11/11/2017 1307 Last data filed at 11/11/2017 0900 Gross per 24 hour  Intake 426 ml  Output 2650 ml  Net -2224 ml   Filed Weights   11/09/17 0525 11/10/17 0546 11/11/17 0602  Weight: 68.1 kg (150 lb 2.1 oz) 66 kg (145 lb 8.1 oz) 65.8 kg (145 lb 1 oz)    Examination:  General exam: Appears calm and comfortable, in no acute distress. Respiratory system: Clear to auscultation. Respiratory effort normal. Cardiovascular system: S1 & S2 heard, RRR. JVD and  murmur present.  Gastrointestinal system: Abdomen is nondistended, soft and nontender.  Central nervous system: Alert and oriented. No focal neurological deficits. Extremities: Symmetric 5 x 5 power. LUE 1+ edema, BLE 1+ edema Skin: No rashes, lesions or ulcers Psychiatry: Judgement and insight appear normal. Mood & affect appropriate.    Data Reviewed: I have personally reviewed  following labs and imaging studies  CBC: Recent Labs  Lab 11/06/17 2003  WBC 5.2  NEUTROABS 3.5  HGB 12.2  HCT 37.6  MCV 87.2  PLT 214   Basic Metabolic Panel: Recent Labs  Lab 11/07/17 0514 11/08/17 0517 11/09/17 0519 11/10/17 0449 11/11/17 0517  NA 134* 135 134* 137 137  K 4.0 3.9 3.3* 3.4* 3.2*  CL 101 100* 99* 99* 98*  CO2 22 22 23 23 23   GLUCOSE 97 85 90 91 89  BUN 84* 93* 100* 106* 116*  CREATININE 2.35* 2.61* 2.67* 2.58* 2.83*  CALCIUM 8.8* 8.7* 8.7* 8.8* 8.8*  MG  --  2.9* 2.8* 2.8* 2.7*   GFR: Estimated Creatinine Clearance: 10.2 mL/min (A) (by C-G formula based on SCr of 2.83 mg/dL (H)). Liver Function Tests: Recent Labs  Lab 11/06/17 2003  AST 29  ALT 11*  ALKPHOS 126  BILITOT 0.9  PROT 7.2  ALBUMIN 3.4*   No results for input(s): LIPASE, AMYLASE in the last 168 hours. No results for input(s): AMMONIA in the last 168 hours. Coagulation Profile: No results for input(s): INR, PROTIME in the last 168 hours. Cardiac Enzymes: Recent Labs  Lab 11/06/17 2003  TROPONINI 0.09*   BNP (last 3 results) No results for input(s): PROBNP in the last 8760 hours. HbA1C: No results for input(s): HGBA1C in the last 72 hours. CBG: No results for input(s): GLUCAP in the last 168 hours. Lipid Profile: No results for input(s): CHOL, HDL, LDLCALC, TRIG, CHOLHDL, LDLDIRECT in the last 72 hours. Thyroid Function Tests: No results for input(s): TSH, T4TOTAL, FREET4, T3FREE, THYROIDAB in the last 72 hours. Anemia Panel: No results for input(s): VITAMINB12, FOLATE, FERRITIN, TIBC, IRON, RETICCTPCT in the last 72 hours. Urine analysis: No results found for: COLORURINE, APPEARANCEUR, LABSPEC, PHURINE, GLUCOSEU, HGBUR, BILIRUBINUR, KETONESUR, PROTEINUR, UROBILINOGEN, NITRITE, LEUKOCYTESUR Sepsis Labs: @LABRCNTIP (procalcitonin:4,lacticidven:4)  )No results found for this or any previous visit (from the past 240 hour(s)).    Radiology Studies: No results  found.      Scheduled Meds: . calcium carbonate  1,000 mg Oral Daily  . cholecalciferol  1,000 Units Oral Daily  . furosemide  80 mg Intravenous BID  . heparin  5,000 Units Subcutaneous Q8H  . levothyroxine  150 mcg Oral QAC breakfast  . multivitamin  2 tablet Oral Daily  . polyvinyl alcohol  1 drop Both Eyes QHS  . sodium chloride flush  3 mL Intravenous Q12H   Continuous Infusions: . sodium chloride    . potassium chloride 10 mEq (11/11/17 1206)     LOS: 5 days    Time spent: 25 mins  Janine OresBlaine Cord Wilczynski, PA-S [facility] If 7PM-7AM, please contact night-coverage [email or web address] Password [email or web address] 11/11/2017, 1:07 PM

## 2017-11-12 DIAGNOSIS — L8990 Pressure ulcer of unspecified site, unspecified stage: Secondary | ICD-10-CM

## 2017-11-12 LAB — BASIC METABOLIC PANEL
Anion gap: 15 (ref 5–15)
BUN: 115 mg/dL — ABNORMAL HIGH (ref 6–20)
CHLORIDE: 99 mmol/L — AB (ref 101–111)
CO2: 24 mmol/L (ref 22–32)
Calcium: 8.9 mg/dL (ref 8.9–10.3)
Creatinine, Ser: 2.76 mg/dL — ABNORMAL HIGH (ref 0.44–1.00)
GFR calc Af Amer: 16 mL/min — ABNORMAL LOW (ref >60)
GFR calc non Af Amer: 13 mL/min — ABNORMAL LOW (ref >60)
Glucose, Bld: 90 mg/dL (ref 65–99)
POTASSIUM: 3.2 mmol/L — AB (ref 3.5–5.1)
SODIUM: 138 mmol/L (ref 135–145)

## 2017-11-12 LAB — MAGNESIUM: MAGNESIUM: 2.7 mg/dL — AB (ref 1.7–2.4)

## 2017-11-12 MED ORDER — POTASSIUM CHLORIDE 20 MEQ/15ML (10%) PO SOLN
40.0000 meq | ORAL | Status: AC
  Administered 2017-11-12 (×2): 40 meq via ORAL
  Filled 2017-11-12 (×2): qty 30

## 2017-11-12 MED ORDER — POTASSIUM CHLORIDE CRYS ER 20 MEQ PO TBCR
40.0000 meq | EXTENDED_RELEASE_TABLET | ORAL | Status: DC
  Filled 2017-11-12: qty 2

## 2017-11-12 NOTE — Progress Notes (Signed)
PROGRESS NOTE    Kara Cruz  [id number] DOB: 11/19/1917 DOA: 11/06/2017 PCP: Jarome MatinPaterson, Daniel, MD     Brief Narrative:  Patient is 84 year old female with history of combined CHF, severe aortic stenosis, CKD stage III, hypothyroidism, PAF with pacemakerwhopresented to theEDcomplaining of shortness of breath, left upper extremity swelling,bilateral lower extremity swelling and weight gain. Upon ED evaluation BNP noted to be above 3000, chest x-ray consistent with fluid overload. Patient was admitted with working diagnosis of CHF exacerbation.   Assessment & Plan:   Principal Problem:   Acute on chronic combined systolic and diastolic CHF (congestive heart failure) (HCC) Active Problems:   Hypothyroidism   Complete heart block (HCC)   Nonrheumatic aortic valve stenosis   Edema   Acute renal failure superimposed on stage 3 chronic kidney disease (HCC)   Acute on chronic congestive heart failure (HCC)   Respiratory distress   Pressure injury of skin   Acute on chronic combined systolic and diastolic CHF Patient with severe aortic stenosis, per cardiology disease terminal and not much else to do. Last echocardiogram EF of 40-45% with severe aortic stenosis, BNP >3K, chest x-ray with increase in vascular congestion and pulmonary edema.  Continues with good urine output so far she is negative ~8.6L. Weight is down 10 pounds.  Persistent lower extremity edema.  IV Lasix to 80 twice daily. Continue checking daily weights and strict I and O's. Keep legs elevated.  Will try compression stockings.  Out of bed as tolerated  AKI on CKD stage III Suspect cardiorenal syndrome versus progressive CKD.  Patient still with fluid overload. Cr stable this morning, but suspect poor prognosis overall   Severe aortic stenosis Per cardiology nothing to offer at this time, not candidate for surgical management and she previously declined.  Left upper extremity edema - significantly  improved with elevation ? Lymphedema due to placement of pacemaker Last admission with similar event Doppler ultrasound was done which I reviewed and no signs of DVT. Keep arm elevated   PAF Rate control with pacemaker Not candidate for anticoagulation  Hypokalemia Due to aggressive diuresis Replace, trend    DVT prophylaxis: subq hep Code Status: DNR Family Communication: daughter at bedside Disposition Plan: discussed with daughter regarding patient's poor overall prognosis.  She has CHF with severe aortic stenosis.  She continues to be very frail and deconditioned.  She is currently on Lasix IV, creatinine has stabilized, although renal function remains poor.  I do feel that patient is hospice appropriate, with prognosis less than 6 months.  If family wants to take patient home, I think home hospice is a good option with transition to residential hospice over the next few weeks to months depending on patient's clinical decline.   Consultants:   Cardiology  Palliative care  Procedures:   None  Antimicrobials:  Anti-infectives (From admission, onward)   None       Subjective: Patient states that she feels lousy.  She admits to feeling tired and weak.  She describes her feeling as being an old car that needs parts replaced.  Objective: Vitals:   11/11/17 2152 11/12/17 0545 11/12/17 0550 11/12/17 1357  BP: (!) 107/51 (!) 106/58  121/62  Pulse: 70 (!) 59  67  Resp: 16 20  16   Temp: (!) 97.5 F (36.4 C) (!) 97.5 F (36.4 C)  97.9 F (36.6 C)  TempSrc: Oral Oral  Oral  SpO2: 94% 93%  96%  Weight:   64.6 kg (142 lb 6.7 oz)  Height:        Intake/Output Summary (Last 24 hours) at 11/12/2017 1422 Last data filed at 11/12/2017 0200 Gross per 24 hour  Intake 123 ml  Output 1100 ml  Net -977 ml   Filed Weights   11/10/17 0546 11/11/17 0602 11/12/17 0550  Weight: 66 kg (145 lb 8.1 oz) 65.8 kg (145 lb 1 oz) 64.6 kg (142 lb 6.7 oz)    Examination:  General  exam: Appears calm and comfortable  Respiratory system: Clear to auscultation. Respiratory effort normal. Cardiovascular system: S1 & S2 heard, RRR. No JVD, murmurs, rubs, gallops or clicks. +pitting pedal edema. Gastrointestinal system: Abdomen is nondistended, soft and nontender. No organomegaly or masses felt. Normal bowel sounds heard. Central nervous system: Alert and oriented. No focal neurological deficits. Extremities: Symmetric  Skin: No rashes, lesions or ulcers Psychiatry: Judgement and insight appear normal.   Data Reviewed: I have personally reviewed following labs and imaging studies  CBC: Recent Labs  Lab 11/06/17 2003  WBC 5.2  NEUTROABS 3.5  HGB 12.2  HCT 37.6  MCV 87.2  PLT 214   Basic Metabolic Panel: Recent Labs  Lab 11/08/17 0517 11/09/17 0519 11/10/17 0449 11/11/17 0517 11/12/17 0555  NA 135 134* 137 137 138  K 3.9 3.3* 3.4* 3.2* 3.2*  CL 100* 99* 99* 98* 99*  CO2 22 23 23 23 24   GLUCOSE 85 90 91 89 90  BUN 93* 100* 106* 116* 115*  CREATININE 2.61* 2.67* 2.58* 2.83* 2.76*  CALCIUM 8.7* 8.7* 8.8* 8.8* 8.9  MG 2.9* 2.8* 2.8* 2.7* 2.7*   GFR: Estimated Creatinine Clearance: 10.4 mL/min (A) (by C-G formula based on SCr of 2.76 mg/dL (H)). Liver Function Tests: Recent Labs  Lab 11/06/17 2003  AST 29  ALT 11*  ALKPHOS 126  BILITOT 0.9  PROT 7.2  ALBUMIN 3.4*   No results for input(s): LIPASE, AMYLASE in the last 168 hours. No results for input(s): AMMONIA in the last 168 hours. Coagulation Profile: No results for input(s): INR, PROTIME in the last 168 hours. Cardiac Enzymes: Recent Labs  Lab 11/06/17 2003  TROPONINI 0.09*   BNP (last 3 results) No results for input(s): PROBNP in the last 8760 hours. HbA1C: No results for input(s): HGBA1C in the last 72 hours. CBG: No results for input(s): GLUCAP in the last 168 hours. Lipid Profile: No results for input(s): CHOL, HDL, LDLCALC, TRIG, CHOLHDL, LDLDIRECT in the last 72 hours. Thyroid  Function Tests: No results for input(s): TSH, T4TOTAL, FREET4, T3FREE, THYROIDAB in the last 72 hours. Anemia Panel: No results for input(s): VITAMINB12, FOLATE, FERRITIN, TIBC, IRON, RETICCTPCT in the last 72 hours. Sepsis Labs: Recent Labs  Lab 11/06/17 2057  LATICACIDVEN 2.10*    No results found for this or any previous visit (from the past 240 hour(s)).     Radiology Studies: No results found.    Scheduled Meds: . calcium carbonate  1,000 mg Oral Daily  . cholecalciferol  1,000 Units Oral Daily  . furosemide  80 mg Intravenous BID  . heparin  5,000 Units Subcutaneous Q8H  . levothyroxine  150 mcg Oral QAC breakfast  . multivitamin  2 tablet Oral Daily  . polyvinyl alcohol  1 drop Both Eyes QHS  . potassium chloride  40 mEq Oral Q4H  . sodium chloride flush  3 mL Intravenous Q12H   Continuous Infusions: . sodium chloride       LOS: 6 days    Time spent: 25 minutes   Victorino Dike  Alvino Chapel, DO [facility] [email or web address] Password [email or web address] 11/12/2017, 2:22 PM

## 2017-11-12 NOTE — Progress Notes (Signed)
   11/12/17 1500  Clinical Encounter Type  Visited With Patient and family together  Visit Type Follow-up   Rounding on Palliative patients on 4.  This is a follow up visit.  Patient seemed to be more alert than the last visit.  Daughter and 2 other relatives present.  Patient shared some about her life.  I told her I thought she looked perkier that the other day and she responded, you need new glasses I don't feel perkier.  We laughed some.  Will follow and support as needed. Chaplain Agustin CreeNewton Amyrie Illingworth

## 2017-11-12 NOTE — Care Management Note (Signed)
Case Management Note  Patient Details  Name: Kara Cruz MRN: [id number] Date of Birth: 05/11/1918  Subjective/Objective: Received CM referral for home hospice choice-D[facility] Britta MccreedyBarbara [facility] Bambi assessed & accepted patient for service. They will provid any dme needed. Currently dtr says she will be able to transport home on own. DNR form on shadow chart for MD signature. No further CM needs.                 Action/Plan:d/c home w/hospice.   Expected Discharge Date:                  Expected Discharge Plan:  Home w Hospice Care  In-House Referral:     Discharge planning Services  CM Consult  Post Acute Care Choice:    Choice offered to:  Adult Children  DME Arranged:    DME Agency:     HH Arranged:  RN HH Agency:  [facility]  Status of Service:  Completed, signed off  If discussed at Long Length of Stay Meetings, dates discussed:    Additional Comments:  Lanier ClamMahabir, Floy Angert, RN 11/12/2017, 2:29 PM

## 2017-11-12 NOTE — Progress Notes (Signed)
Physical Therapy Treatment Patient Details Name: Kara Cruz MRN: [id number] DOB: 11/23/1917 Today's Date: 11/12/2017    History of Present Illness Kara Cruz is a 84 year old female with medical history of combined CHF, severe aortic stenosis, CKD stage III, hypothyroidism, PAF with pacemaker presented to the emergency department complaining of shortness of breath, lower extremity swelling and weight gain.  Upon ED evaluation BNP noted to be above 3000, chest x-ray consistent with fluid overload.  Patient was admitted with working diagnosis of CHF exacerbation    PT Comments    Assisted OOB to amb a limited but functional distance.  Assisted back to bed with B LE elevated.  Daughter present during session and very helpful.    Follow Up Recommendations  Home health PT(with daughter)     Equipment Recommendations  None recommended by PT    Recommendations for Other Services       Precautions / Restrictions Precautions Precautions: Fall Restrictions Weight Bearing Restrictions: No    Mobility  Bed Mobility Overal bed mobility: Needs Assistance Bed Mobility: Supine to Sit;Sit to Supine     Supine to sit: Supervision Sit to supine: Min assist   General bed mobility comments: used rail and HOB eleated but only supervision with increased time but required assist B LE up onto bed ddue to edema  Transfers Overall transfer level: Needs assistance Equipment used: Rolling walker (2 wheeled) Transfers: Sit to/from Stand Sit to Stand: Supervision;Min guard         General transfer comment: one VC on safety with turns and proper hand placement with stand to sit  Ambulation/Gait Ambulation/Gait assistance: Min guard;Supervision Ambulation Distance (Feet): 65 Feet Assistive device: Rolling walker (2 wheeled) Gait Pattern/deviations: Step-to pattern;Step-through pattern;Decreased stride length;Trunk flexed Gait velocity: decreased   General Gait Details: tolerated  well.  slow but staedy gait.     Stairs            Wheelchair Mobility    Modified Rankin (Stroke Patients Only)       Balance                                            Cognition Arousal/Alertness: Awake/alert Behavior During Therapy: WFL for tasks assessed/performed Overall Cognitive Status: Within Functional Limits for tasks assessed                                        Exercises      General Comments        Pertinent Vitals/Pain Pain Assessment: No/denies pain    Home Living                      Prior Function            PT Goals (current goals can now be found in the care plan section) Progress towards PT goals: Progressing toward goals    Frequency    Min 3X/week      PT Plan Current plan remains appropriate    Co-evaluation              AM-PAC PT "6 Clicks" Daily Activity  Outcome Measure  Difficulty turning over in bed (including adjusting bedclothes, sheets and blankets)?: A Little Difficulty moving from lying on back to sitting on the  side of the bed? : A Little Difficulty sitting down on and standing up from a chair with arms (e.g., wheelchair, bedside commode, etc,.)?: A Little Help needed moving to and from a bed to chair (including a wheelchair)?: A Little Help needed walking in hospital room?: A Little Help needed climbing 3-5 steps with a railing? : A Lot 6 Click Score: 17    End of Session Equipment Utilized During Treatment: Gait belt Activity Tolerance: Patient tolerated treatment well Patient left: in bed;with call bell/phone within reach;with bed alarm set;with nursing/sitter in room(B LE elevated) Nurse Communication: Mobility status PT Visit Diagnosis: Other abnormalities of gait and mobility (R26.89)     Time: 1610-9604 PT Time Calculation (min) (ACUTE ONLY): 11 min  Charges:  $Gait Training: 8-22 mins                    G Codes:       Felecia Shelling  PTA [facility]   Acute  Rehab Pager      [phone]

## 2017-11-13 LAB — BASIC METABOLIC PANEL
Anion gap: 16 — ABNORMAL HIGH (ref 5–15)
BUN: 112 mg/dL — ABNORMAL HIGH (ref 6–20)
CALCIUM: 9 mg/dL (ref 8.9–10.3)
CO2: 22 mmol/L (ref 22–32)
Chloride: 102 mmol/L (ref 101–111)
Creatinine, Ser: 2.86 mg/dL — ABNORMAL HIGH (ref 0.44–1.00)
GFR calc Af Amer: 15 mL/min — ABNORMAL LOW (ref >60)
GFR, EST NON AFRICAN AMERICAN: 13 mL/min — AB (ref >60)
GLUCOSE: 94 mg/dL (ref 65–99)
POTASSIUM: 4 mmol/L (ref 3.5–5.1)
SODIUM: 140 mmol/L (ref 135–145)

## 2017-11-13 NOTE — Progress Notes (Signed)
PROGRESS NOTE    Kara Cruz  ZOX:[id number] DOB: 10/27/1917 DOA: 11/06/2017 PCP: Jarome Matin, MD    Brief Narrative:  Patient is 84 year old female with history of combined CHF, severe aortic stenosis, CKD stage III, hypothyroidism, PAF with pacemakerwhopresented to theEDcomplaining of shortness of breath, left upper extremity swelling,bilaterallower extremity swelling and weight gain. Upon ED evaluation BNP noted to be above 3000, chest x-ray consistent with fluid overload. Patient was admitted with working diagnosis of CHF exacerbation    Assessment & Plan:   Principal Problem:   Acute on chronic combined systolic and diastolic CHF (congestive heart failure) (HCC) Active Problems:   Hypothyroidism   Complete heart block (HCC)   Nonrheumatic aortic valve stenosis   Edema   Acute renal failure superimposed on stage 3 chronic kidney disease (HCC)   Acute on chronic congestive heart failure (HCC)   Respiratory distress   Pressure injury of skin   Acute on chronic combined systolic and diastolic heart failure With  severe aortic stenosis.  Cardiology recommendatons given.  Continue with IV lasix for the next 24 hours for more diuresis and transition to oral on discharge. Only 1200 ml output in the last 24 hours. Diuresed about 10 lit since admission. She appears comfortable not in any distress.  Plan for discharge in am with home hospice.    Acute on Stage 3 CKD: ? Cardio renal syndrome.  Poor prognosis.    Severe aortic stenosis: Not a surgical candidate. And pt also refused for any heroic measures.    PAF: Rate controlled.    Hypokalemia: replaced.    Left upper extremity edema.  Improved. ? Lymphedema due to placement of pacemaker Last admission with similar event Doppler ultrasound was done which I reviewed and no signs of DVT. Keep arm elevated          DVT prophylaxis: sq heparin.  Code Status: DNR Family Communication: daughter at  bedside.  Disposition Plan: home WITH  Hospice when hospital bed arrives.    Consultants:   Cardiology and palliative care.    Procedures: none.    Antimicrobials: none.    Subjective: No new complaints, wants hospital bed. And be discharged in the morning with home hospice.   Objective: Vitals:   11/13/17 0557 11/13/17 0557 11/13/17 0558 11/13/17 1335  BP: 117/61 117/61  (!) 111/53  Pulse: (!) 41 67  (!) 59  Resp: 20 20  20   Temp: 97.6 F (36.4 C) 97.6 F (36.4 C)  (!) 97.4 F (36.3 C)  TempSrc: Oral Oral  Oral  SpO2: 95% 95%  94%  Weight:   64.4 kg (141 lb 15.6 oz)   Height:        Intake/Output Summary (Last 24 hours) at 11/13/2017 1816 Last data filed at 11/12/2017 2200 Gross per 24 hour  Intake 0 ml  Output 300 ml  Net -300 ml   Filed Weights   11/11/17 0602 11/12/17 0550 11/13/17 0558  Weight: 65.8 kg (145 lb 1 oz) 64.6 kg (142 lb 6.7 oz) 64.4 kg (141 lb 15.6 oz)    Examination:  General exam: Appears calm and comfortable not in distress.  Respiratory system: diminished air entry at bases, scattered wheezing.  Cardiovascular system: S1 & S2 heard, RRR. No JVD,  Gastrointestinal system: Abdomen is nondistended, soft and nontender. No organomegaly or masses felt. Normal bowel sounds heard. Central nervous system: Alert and oriented. Non focal Extremities: Symmetric 5 x 5 power. Skin: No rashes, lesions or ulcers Psychiatry:  Mood & affect appropriate.     Data Reviewed: I have personally reviewed following labs and imaging studies  CBC: Recent Labs  Lab 11/06/17 2003  WBC 5.2  NEUTROABS 3.5  HGB 12.2  HCT 37.6  MCV 87.2  PLT 214   Basic Metabolic Panel: Recent Labs  Lab 11/08/17 0517 11/09/17 0519 11/10/17 0449 11/11/17 0517 11/12/17 0555 11/13/17 0457  NA 135 134* 137 137 138 140  K 3.9 3.3* 3.4* 3.2* 3.2* 4.0  CL 100* 99* 99* 98* 99* 102  CO2 22 23 23 23 24 22   GLUCOSE 85 90 91 89 90 94  BUN 93* 100* 106* 116* 115* 112*    CREATININE 2.61* 2.67* 2.58* 2.83* 2.76* 2.86*  CALCIUM 8.7* 8.7* 8.8* 8.8* 8.9 9.0  MG 2.9* 2.8* 2.8* 2.7* 2.7*  --    GFR: Estimated Creatinine Clearance: 10 mL/min (A) (by C-G formula based on SCr of 2.86 mg/dL (H)). Liver Function Tests: Recent Labs  Lab 11/06/17 2003  AST 29  ALT 11*  ALKPHOS 126  BILITOT 0.9  PROT 7.2  ALBUMIN 3.4*   No results for input(s): LIPASE, AMYLASE in the last 168 hours. No results for input(s): AMMONIA in the last 168 hours. Coagulation Profile: No results for input(s): INR, PROTIME in the last 168 hours. Cardiac Enzymes: Recent Labs  Lab 11/06/17 2003  TROPONINI 0.09*   BNP (last 3 results) No results for input(s): PROBNP in the last 8760 hours. HbA1C: No results for input(s): HGBA1C in the last 72 hours. CBG: No results for input(s): GLUCAP in the last 168 hours. Lipid Profile: No results for input(s): CHOL, HDL, LDLCALC, TRIG, CHOLHDL, LDLDIRECT in the last 72 hours. Thyroid Function Tests: No results for input(s): TSH, T4TOTAL, FREET4, T3FREE, THYROIDAB in the last 72 hours. Anemia Panel: No results for input(s): VITAMINB12, FOLATE, FERRITIN, TIBC, IRON, RETICCTPCT in the last 72 hours. Sepsis Labs: Recent Labs  Lab 11/06/17 2057  LATICACIDVEN 2.10*    No results found for this or any previous visit (from the past 240 hour(s)).       Radiology Studies: No results found.      Scheduled Meds: . calcium carbonate  1,000 mg Oral Daily  . cholecalciferol  1,000 Units Oral Daily  . furosemide  80 mg Intravenous BID  . heparin  5,000 Units Subcutaneous Q8H  . levothyroxine  150 mcg Oral QAC breakfast  . multivitamin  2 tablet Oral Daily  . polyvinyl alcohol  1 drop Both Eyes QHS  . sodium chloride flush  3 mL Intravenous Q12H   Continuous Infusions: . sodium chloride       LOS: 7 days    Time spent: 35 minutes.     Kathlen ModyVijaya Ninoshka Wainwright, MD [facility] Pager [phone]  If 7PM-7AM, please contact  night-coverage [email or web address] Password [email or web address] 11/13/2017, 6:16 PM

## 2017-11-13 NOTE — Progress Notes (Signed)
Dtr informed me that she will need a hospital bed & concerned about moving the current bed . CM informed her that she is responsible to have current bed moved so the hospital bed that will come will be able to go where she needs it. [facility] Bambi aware of no d/c today, & dme needed-hospital bed-they will manage all dme needed.

## 2017-11-13 NOTE — Progress Notes (Signed)
Physical Therapy Treatment Patient Details Name: Kara Cruz MRN: [id number] DOB: [birth year] Today's Date: 11/13/2017    History of Present Illness Kara Cruz is a 84 year old female with medical history of combined CHF, severe aortic stenosis, CKD stage III, hypothyroidism, PAF with pacemaker presented to the emergency department complaining of shortness of breath, lower extremity swelling and weight gain.  Upon ED evaluation BNP noted to be above 3000, chest x-ray consistent with fluid overload.  Patient was admitted with working diagnosis of CHF exacerbation    PT Comments    Pt feeling "okay" but noted increased distress and RR.  Pt stated "they said I was going home and now I'm not"   Today Assisted OOB to amb to bathroom.  Assisted in bathroom with peri care and static standing at sink to wash hands.  Assisted back to bed.  Pt moves well only requires Min/MinGuard Assist.    Follow Up Recommendations  Home health PT(Hospice)     Equipment Recommendations  Hospital bed    Recommendations for Other Services       Precautions / Restrictions Precautions Precautions: Fall Restrictions Weight Bearing Restrictions: No Other Position/Activity Restrictions: B LE edema (elevate)    Mobility  Bed Mobility Overal bed mobility: Needs Assistance Bed Mobility: Supine to Sit;Sit to Supine     Supine to sit: Supervision Sit to supine: Min guard   General bed mobility comments: used rail and HOB eleated but only supervision with increased time but required assist B LE up onto bed ddue to edema  Transfers Overall transfer level: Needs assistance Equipment used: Rolling walker (2 wheeled) Transfers: Sit to/from Stand Sit to Stand: Supervision;Min guard         General transfer comment: one VC on safety with turns and proper hand placement with stand to sit   assisted on/off regular height toilet  Ambulation/Gait Ambulation/Gait assistance: Min  guard;Supervision Ambulation Distance (Feet): 22 Feet(to and from bathroom) Assistive device: Rolling walker (2 wheeled) Gait Pattern/deviations: Step-to pattern;Step-through pattern;Decreased stride length;Trunk flexed Gait velocity: decreased   General Gait Details: tolerated well.  slow but staedy gait.  Noted increased RR vs yesterday.  RA sats 91% and HR 76   Stairs            Wheelchair Mobility    Modified Rankin (Stroke Patients Only)       Balance                                            Cognition Arousal/Alertness: Awake/alert Behavior During Therapy: WFL for tasks assessed/performed Overall Cognitive Status: Within Functional Limits for tasks assessed                                        Exercises      General Comments        Pertinent Vitals/Pain Pain Assessment: No/denies pain    Home Living                      Prior Function            PT Goals (current goals can now be found in the care plan section) Progress towards PT goals: Progressing toward goals    Frequency    Min 3X/week  PT Plan Current plan remains appropriate    Co-evaluation              AM-PAC PT "6 Clicks" Daily Activity  Outcome Measure  Difficulty turning over in bed (including adjusting bedclothes, sheets and blankets)?: A Little Difficulty moving from lying on back to sitting on the side of the bed? : A Little Difficulty sitting down on and standing up from a chair with arms (e.g., wheelchair, bedside commode, etc,.)?: A Little Help needed moving to and from a bed to chair (including a wheelchair)?: A Little Help needed walking in hospital room?: A Little Help needed climbing 3-5 steps with a railing? : A Lot 6 Click Score: 17    End of Session Equipment Utilized During Treatment: Gait belt Activity Tolerance: Patient tolerated treatment well Patient left: in bed;with call bell/phone within  reach;with bed alarm set;with nursing/sitter in room Nurse Communication: Mobility status PT Visit Diagnosis: Other abnormalities of gait and mobility (R26.89)     Time: 1405-1430 PT Time Calculation (min) (ACUTE ONLY): 25 min  Charges:  $Gait Training: 8-22 mins $Therapeutic Activity: 8-22 mins                    G Codes:       Felecia ShellingLori Deidrick Rainey  PTA [facility]  Acute  Rehab Pager      [phone]

## 2017-11-14 LAB — BASIC METABOLIC PANEL
Anion gap: 16 — ABNORMAL HIGH (ref 5–15)
BUN: 125 mg/dL — ABNORMAL HIGH (ref 6–20)
CALCIUM: 9 mg/dL (ref 8.9–10.3)
CO2: 22 mmol/L (ref 22–32)
CREATININE: 3.09 mg/dL — AB (ref 0.44–1.00)
Chloride: 102 mmol/L (ref 101–111)
GFR calc non Af Amer: 12 mL/min — ABNORMAL LOW (ref >60)
GFR, EST AFRICAN AMERICAN: 14 mL/min — AB (ref >60)
Glucose, Bld: 90 mg/dL (ref 65–99)
Potassium: 3.6 mmol/L (ref 3.5–5.1)
SODIUM: 140 mmol/L (ref 135–145)

## 2017-11-14 MED ORDER — CALCIUM CARBONATE ANTACID 500 MG PO CHEW: 400.0000 mg | CHEWABLE_TABLET | Freq: Every day | ORAL | Status: DC

## 2017-11-14 NOTE — Progress Notes (Signed)
Patient's daughter notified nurse that bed, O2 and nebulizer equipment has been delivered to the home. Will notify [facility] for transport home.Kara Cruz A Armonte Tortorella, RN

## 2017-11-14 NOTE — Plan of Care (Signed)
  Problem: Activity: Goal: Risk for activity intolerance will decrease Outcome: Progressing   Problem: Coping: Goal: Level of anxiety will decrease Outcome: Progressing   Problem: Skin Integrity: Goal: Risk for impaired skin integrity will decrease Outcome: Progressing   

## 2017-11-14 NOTE — Care Management Note (Signed)
Case Management Note  Patient Details  Name: Debe Codernne S Russett MRN: [id number] Date of Birth: 10/18/1917  Subjective/Objective: [facility] rep Bambi liason aware of d/c & dme needs-they will manage-they have already informed dtr Britta MccreedyBarbara that she must have(make her own arrangements) the area for the hospital bed clear. PTAR for ambulance transp forms, & DNR on shadow chart for nsg to call when ready. No further CM needs.                   Action/Plan:d/c home w/Hospice/PTAR.   Expected Discharge Date:  11/14/17               Expected Discharge Plan:  Home w Hospice Care  In-House Referral:     Discharge planning Services  CM Consult  Post Acute Care Choice:    Choice offered to:  Adult Children  DME Arranged:    DME Agency:     HH Arranged:  RN HH Agency:  [facility]  Status of Service:  Completed, signed off  If discussed at Long Length of Stay Meetings, dates discussed:    Additional Comments:  Lanier ClamMahabir, Unknown Flannigan, RN 11/14/2017, 10:57 AM

## 2017-11-14 NOTE — Discharge Summary (Signed)
Physician Discharge Summary  Kara Cruz ZOX:[id number] DOB: 12-10-1917 DOA: 11/06/2017  PCP: Jarome Matin, MD  Admit date: 11/06/2017 Discharge date: 11/14/2017  Admitted From: Home.  Disposition:  Home with home hospice.   Recommendations for Outpatient Follow-up:  1. Follow up with PCP in 1-2 weeks 2. Please obtain BMP/CBC in one week 3. Please follow up with home hospice.     Discharge Condition:hospice.  CODE STATUS: DNR Diet recommendation: Heart Healthy   Brief/Interim Summary: Patient is 84 year old female with history of combined CHF, severe aortic stenosis, CKD stage III, hypothyroidism, PAF with pacemakerwhopresented to theEDcomplaining of shortness of breath, left upper extremity swelling,bilaterallower extremity swelling and weight gain. Upon ED evaluation BNP noted to be above 3000, chest x-ray consistent with fluid overload. Patient was admitted with working diagnosis of CHF exacerbation    Discharge Diagnoses:  Principal Problem:   Acute on chronic combined systolic and diastolic CHF (congestive heart failure) (HCC) Active Problems:   Hypothyroidism   Complete heart block (HCC)   Nonrheumatic aortic valve stenosis   Edema   Acute renal failure superimposed on stage 3 chronic kidney disease (HCC)   Acute on chronic congestive heart failure (HCC)   Respiratory distress   Pressure injury of skin   Acute on chronic combined systolic and diastolic heart failure With  severe aortic stenosis.  Cardiology recommendatons given.  Started on IV lasix and diuresed  about 10 lit since admission. She appears comfortable not in any distress.  Plan for discharge with home hospice.    Acute on Stage 3 CKD: ? Cardio renal syndrome.  Poor prognosis.    Severe aortic stenosis: Not a surgical candidate. And pt also refused for any heroic measures.    PAF: Rate controlled.    Hypokalemia: replaced.    Left upper extremity edema.   Improved. ? Lymphedema due to placement of pacemaker Last admission with similar event Doppler ultrasound was done which I reviewed and no signs of DVT. Keep arm elevated         Discharge Instructions  Discharge Instructions    Diet - low sodium heart healthy   Complete by:  As directed    Discharge instructions   Complete by:  As directed    Home with home hospice.     Allergies as of 11/14/2017      Reactions   Labetalol Swelling   Blisters around lips Swollen lips & eyes, blistering lips, dry mouth   Inspra [eplerenone] Other (See Comments)   Body Rash;  itching of the face   Latex Itching   Moxifloxacin Other (See Comments)   Doesn't remember   Prednisone Nausea And Vomiting   Previously listed as SHOB, but verified with patient/daughter 08/10/17, only N/V as per [facility] medical record   Pseudoephedrine Hcl Other (See Comments)   Codeine Sulfate Other (See Comments)   REACTION: "out of body experience"--"floats"   Diphenhydramine Hcl Other (See Comments)   REACTION: headache, vertigo   Doxycycline Hyclate Other (See Comments)   REACTION: nausea, vomiting   Hibiclens [chlorhexidine] Rash   wipes      Medication List    STOP taking these medications   meclizine 25 MG tablet Commonly known as:  ANTIVERT     TAKE these medications   calcium carbonate 750 MG chewable tablet Commonly known as:  TUMS EX Chew 2 tablets by mouth daily.   cholecalciferol 1000 units tablet Commonly known as:  VITAMIN D Take 1,000 Units by mouth daily.  levothyroxine 150 MCG tablet Commonly known as:  SYNTHROID, LEVOTHROID Take 1 tablet (150 mcg total) by mouth daily before breakfast.   Lutein 20 MG Tabs Take 20 mg by mouth daily.   Potassium 99 MG Tabs Take 198 mg by mouth daily. 2 tablets in the AM and 1 tablet in the evening   PRESERVISION AREDS 2 Caps Take 2 tablets by mouth daily.   REFRESH OP Place 1 drop into both eyes at bedtime.   torsemide 20 MG  tablet Commonly known as:  DEMADEX Take 4 tablets (80 mg total) by mouth 2 (two) times daily.            Durable Medical Equipment  (From admission, onward)        Start     Ordered   11/13/17 1310  For home use only DME Hospital bed  Once    Question:  Bed type  Answer:  Semi-electric   11/13/17 1309     Follow-up Information    [facility]   Why:  home nurse Contact information: [address] [place] [place] [address] [phone]          Allergies  Allergen Reactions  . Labetalol Swelling    Blisters around lips Swollen lips & eyes, blistering lips, dry mouth  . Inspra [Eplerenone] Other (See Comments)    Body Rash;  itching of the face  . Latex Itching  . Moxifloxacin Other (See Comments)    Doesn't remember  . Prednisone Nausea And Vomiting    Previously listed as SHOB, but verified with patient/daughter 08/10/17, only N/V [facility]  . Pseudoephedrine Hcl Other (See Comments)  . Codeine Sulfate Other (See Comments)    REACTION: "out of body experience"--"floats"  . Diphenhydramine Hcl Other (See Comments)    REACTION: headache, vertigo  . Doxycycline Hyclate Other (See Comments)    REACTION: nausea, vomiting  . Hibiclens [Chlorhexidine] Rash    wipes    Consultations:  Cardiology.    Procedures/Studies: Dg Chest 2 View  Result Date: 11/06/2017 CLINICAL DATA:  Shortness of breath EXAM: CHEST - 2 VIEW COMPARISON:  08/10/2017, 08/06/2017, 09/16/2014 FINDINGS: Small pleural effusions. Cardiomegaly with vascular congestion. Aortic atherosclerosis. Left-sided pacing device with similar lead position. No pneumothorax. IMPRESSION: Cardiomegaly with vascular congestion and small pleural effusions. Electronically Signed   By: Jasmine PangKim  Fujinaga M.D.   On: 11/06/2017 20:21       Subjective: No new complaints.   Discharge Exam: Vitals:   11/13/17 2311 11/14/17 0531  BP:  (!) 107/44  Pulse:  66   Resp:  20  Temp:  97.7 F (36.5 C)  SpO2: 97%    Vitals:   11/13/17 2131 11/13/17 2311 11/14/17 0531 11/14/17 0600  BP: (!) 134/59  (!) 107/44   Pulse: 67  66   Resp: 18  20   Temp: 97.7 F (36.5 C)  97.7 F (36.5 C)   TempSrc: Oral  Oral   SpO2: 91% 97%    Weight:    61.9 kg (136 lb 7.4 oz)  Height:        General: Pt is alert, awake, not in acute distress Cardiovascular: RRR, S1/S2 +, no rubs, no gallops Respiratory: CTA bilaterally, no wheezing, no rhonchi Abdominal: Soft, NT, ND, bowel sounds + Extremities: no edema, no cyanosis    The results of significant diagnostics from this hospitalization (including imaging, microbiology, ancillary and laboratory) are listed below for reference.  Microbiology: No results found for this or any previous visit (from the past 240 hour(s)).   Labs: BNP (last 3 results) Recent Labs    08/06/17 1710 11/06/17 2003  BNP 3,614.5* 3,112.6*   Basic Metabolic Panel: Recent Labs  Lab 11/08/17 0517 11/09/17 0519 11/10/17 0449 11/11/17 0517 11/12/17 0555 11/13/17 0457 11/14/17 0517  NA 135 134* 137 137 138 140 140  K 3.9 3.3* 3.4* 3.2* 3.2* 4.0 3.6  CL 100* 99* 99* 98* 99* 102 102  CO2 22 23 23 23 24 22 22   GLUCOSE 85 90 91 89 90 94 90  BUN 93* 100* 106* 116* 115* 112* 125*  CREATININE 2.61* 2.67* 2.58* 2.83* 2.76* 2.86* 3.09*  CALCIUM 8.7* 8.7* 8.8* 8.8* 8.9 9.0 9.0  MG 2.9* 2.8* 2.8* 2.7* 2.7*  --   --    Liver Function Tests: No results for input(s): AST, ALT, ALKPHOS, BILITOT, PROT, ALBUMIN in the last 168 hours. No results for input(s): LIPASE, AMYLASE in the last 168 hours. No results for input(s): AMMONIA in the last 168 hours. CBC: No results for input(s): WBC, NEUTROABS, HGB, HCT, MCV, PLT in the last 168 hours. Cardiac Enzymes: No results for input(s): CKTOTAL, CKMB, CKMBINDEX, TROPONINI in the last 168 hours. BNP: Invalid input(s): POCBNP CBG: No results for input(s): GLUCAP in the last 168  hours. D-Dimer No results for input(s): DDIMER in the last 72 hours. Hgb A1c No results for input(s): HGBA1C in the last 72 hours. Lipid Profile No results for input(s): CHOL, HDL, LDLCALC, TRIG, CHOLHDL, LDLDIRECT in the last 72 hours. Thyroid function studies No results for input(s): TSH, T4TOTAL, T3FREE, THYROIDAB in the last 72 hours.  Invalid input(s): FREET3 Anemia work up No results for input(s): VITAMINB12, FOLATE, FERRITIN, TIBC, IRON, RETICCTPCT in the last 72 hours. Urinalysis No results found for: COLORURINE, APPEARANCEUR, LABSPEC, PHURINE, GLUCOSEU, HGBUR, BILIRUBINUR, KETONESUR, PROTEINUR, UROBILINOGEN, NITRITE, LEUKOCYTESUR Sepsis Labs Invalid input(s): PROCALCITONIN,  WBC,  LACTICIDVEN Microbiology No results found for this or any previous visit (from the past 240 hour(s)).   Time coordinating discharge: 35 minutes.  SIGNED:   Kathlen Mody, MD  [facility] 11/14/2017, 10:07 AM Pager   If 7PM-7AM, please contact night-coverage [email or web address] Password [email or web address]

## 2017-11-14 NOTE — Progress Notes (Signed)
Noted patient will need home 02 also-[facility]  Rep Bambi is aware of home 02-PTAR forms updated w/home 02.

## 2017-12-23 ENCOUNTER — Ambulatory Visit (INDEPENDENT_AMBULATORY_CARE_PROVIDER_SITE_OTHER): Admitting: *Deleted

## 2017-12-23 DIAGNOSIS — I442 Atrioventricular block, complete: Secondary | ICD-10-CM | POA: Diagnosis not present

## 2017-12-24 NOTE — Progress Notes (Signed)
Remote pacemaker transmission.   

## 2017-12-25 ENCOUNTER — Encounter: Payer: Self-pay | Admitting: Cardiology

## 2017-12-30 ENCOUNTER — Telehealth: Payer: Self-pay

## 2017-12-30 NOTE — Telephone Encounter (Signed)
-----   Message from Melvern Sample, [place] sent at 12/23/2017 11:42 AM EDT ----- Regarding: FYI Patient daughter called and wanted both MD's to know that pt is now at [facility]. She stated that she had to place pt here due to CHF.   Thanks  Britta Mccreedy

## 2018-01-02 LAB — CUP PACEART REMOTE DEVICE CHECK
Battery Remaining Longevity: 60 mo
Battery Voltage: 3 V
Brady Statistic AS VP Percent: 52.66 %
Brady Statistic RV Percent Paced: 95.75 %
Date Time Interrogation Session: 20190522121723
Implantable Lead Implant Date: 20160210
Implantable Lead Location: 753859
Implantable Lead Location: 753860
Implantable Lead Model: 5076
Implantable Pulse Generator Implant Date: 20160210
Lead Channel Impedance Value: 475 Ohm
Lead Channel Pacing Threshold Amplitude: 0.875 V
Lead Channel Pacing Threshold Amplitude: 0.875 V
Lead Channel Pacing Threshold Pulse Width: 0.4 ms
Lead Channel Sensing Intrinsic Amplitude: 1.375 mV
Lead Channel Setting Pacing Amplitude: 2.5 V
Lead Channel Setting Sensing Sensitivity: 2 mV
MDC IDC LEAD IMPLANT DT: 20160210
MDC IDC MSMT LEADCHNL RA IMPEDANCE VALUE: 342 Ohm
MDC IDC MSMT LEADCHNL RA IMPEDANCE VALUE: 494 Ohm
MDC IDC MSMT LEADCHNL RA SENSING INTR AMPL: 1.375 mV
MDC IDC MSMT LEADCHNL RV IMPEDANCE VALUE: 380 Ohm
MDC IDC MSMT LEADCHNL RV PACING THRESHOLD PULSEWIDTH: 0.4 ms
MDC IDC MSMT LEADCHNL RV SENSING INTR AMPL: 8.75 mV
MDC IDC MSMT LEADCHNL RV SENSING INTR AMPL: 8.75 mV
MDC IDC SET LEADCHNL RA PACING AMPLITUDE: 2 V
MDC IDC SET LEADCHNL RV PACING PULSEWIDTH: 0.4 ms
MDC IDC STAT BRADY AP VP PERCENT: 45.6 %
MDC IDC STAT BRADY AP VS PERCENT: 0.03 %
MDC IDC STAT BRADY AS VS PERCENT: 1.71 %
MDC IDC STAT BRADY RA PERCENT PACED: 42.5 %

## 2018-02-02 DEATH — deceased
